# Patient Record
Sex: Female | Born: 1970 | Hispanic: No | Marital: Single
Health system: Southern US, Community
[De-identification: ages and names within clinical notes are randomized; demographics above are authoritative.]

## PROBLEM LIST (undated history)

## (undated) DIAGNOSIS — R51 Headache: Secondary | ICD-10-CM

## (undated) DIAGNOSIS — I10 Essential (primary) hypertension: Secondary | ICD-10-CM

## (undated) DIAGNOSIS — D649 Anemia, unspecified: Secondary | ICD-10-CM

## (undated) DIAGNOSIS — G43909 Migraine, unspecified, not intractable, without status migrainosus: Secondary | ICD-10-CM

## (undated) DIAGNOSIS — F32A Depression, unspecified: Secondary | ICD-10-CM

## (undated) DIAGNOSIS — K219 Gastro-esophageal reflux disease without esophagitis: Secondary | ICD-10-CM

## (undated) DIAGNOSIS — G4730 Sleep apnea, unspecified: Secondary | ICD-10-CM

## (undated) DIAGNOSIS — E119 Type 2 diabetes mellitus without complications: Secondary | ICD-10-CM

## (undated) DIAGNOSIS — J309 Allergic rhinitis, unspecified: Secondary | ICD-10-CM

## (undated) DIAGNOSIS — M1990 Unspecified osteoarthritis, unspecified site: Secondary | ICD-10-CM

## (undated) DIAGNOSIS — I2510 Atherosclerotic heart disease of native coronary artery without angina pectoris: Secondary | ICD-10-CM

## (undated) DIAGNOSIS — E039 Hypothyroidism, unspecified: Secondary | ICD-10-CM

## (undated) DIAGNOSIS — R519 Headache, unspecified: Secondary | ICD-10-CM

## (undated) DIAGNOSIS — E785 Hyperlipidemia, unspecified: Secondary | ICD-10-CM

## (undated) DIAGNOSIS — F419 Anxiety disorder, unspecified: Secondary | ICD-10-CM

## (undated) DIAGNOSIS — F329 Major depressive disorder, single episode, unspecified: Secondary | ICD-10-CM

## (undated) HISTORY — DX: Allergic rhinitis, unspecified: J30.9

## (undated) HISTORY — DX: Hyperlipidemia, unspecified: E78.5

## (undated) HISTORY — DX: Anemia, unspecified: D64.9

## (undated) HISTORY — DX: Atherosclerotic heart disease of native coronary artery without angina pectoris: I25.10

## (undated) HISTORY — PX: INDUCED ABORTION: SHX677

---

## 2000-06-21 ENCOUNTER — Other Ambulatory Visit: Admission: RE | Admit: 2000-06-21 | Discharge: 2000-06-21 | Payer: Self-pay | Admitting: Unknown Physician Specialty

## 2000-06-21 ENCOUNTER — Other Ambulatory Visit: Admission: RE | Admit: 2000-06-21 | Discharge: 2000-06-21 | Payer: Self-pay | Admitting: *Deleted

## 2000-07-20 ENCOUNTER — Inpatient Hospital Stay (HOSPITAL_COMMUNITY): Admission: EM | Admit: 2000-07-20 | Discharge: 2000-07-23 | Payer: Self-pay | Admitting: Emergency Medicine

## 2000-07-20 ENCOUNTER — Encounter: Payer: Self-pay | Admitting: Family Medicine

## 2000-08-06 ENCOUNTER — Encounter: Admission: RE | Admit: 2000-08-06 | Discharge: 2000-11-04 | Payer: Self-pay | Admitting: Family Medicine

## 2009-06-26 HISTORY — PX: TONSILLECTOMY AND ADENOIDECTOMY: SUR1326

## 2014-10-31 ENCOUNTER — Emergency Department (HOSPITAL_COMMUNITY): Payer: 59

## 2014-10-31 ENCOUNTER — Encounter (HOSPITAL_COMMUNITY): Payer: Self-pay | Admitting: Emergency Medicine

## 2014-10-31 ENCOUNTER — Emergency Department (HOSPITAL_COMMUNITY)
Admission: EM | Admit: 2014-10-31 | Discharge: 2014-10-31 | Disposition: A | Discharge status: Home / Self Care | Payer: 59 | Attending: Emergency Medicine | Admitting: Emergency Medicine

## 2014-10-31 DIAGNOSIS — R0602 Shortness of breath: Secondary | ICD-10-CM | POA: Insufficient documentation

## 2014-10-31 DIAGNOSIS — Z3202 Encounter for pregnancy test, result negative: Secondary | ICD-10-CM | POA: Insufficient documentation

## 2014-10-31 DIAGNOSIS — F410 Panic disorder [episodic paroxysmal anxiety] without agoraphobia: Secondary | ICD-10-CM | POA: Diagnosis not present

## 2014-10-31 DIAGNOSIS — R0789 Other chest pain: Secondary | ICD-10-CM | POA: Insufficient documentation

## 2014-10-31 DIAGNOSIS — R51 Headache: Secondary | ICD-10-CM | POA: Insufficient documentation

## 2014-10-31 DIAGNOSIS — R519 Headache, unspecified: Secondary | ICD-10-CM

## 2014-10-31 DIAGNOSIS — E119 Type 2 diabetes mellitus without complications: Secondary | ICD-10-CM | POA: Diagnosis not present

## 2014-10-31 DIAGNOSIS — I10 Essential (primary) hypertension: Secondary | ICD-10-CM | POA: Insufficient documentation

## 2014-10-31 HISTORY — DX: Essential (primary) hypertension: I10

## 2014-10-31 LAB — CBC WITH DIFFERENTIAL/PLATELET
BASOS ABS: 0.1 10*3/uL (ref 0.0–0.1)
BASOS PCT: 1 % (ref 0–1)
Eosinophils Absolute: 0.1 10*3/uL (ref 0.0–0.7)
Eosinophils Relative: 2 % (ref 0–5)
HEMATOCRIT: 40.2 % (ref 36.0–46.0)
Hemoglobin: 13.6 g/dL (ref 12.0–15.0)
Lymphocytes Relative: 33 % (ref 12–46)
Lymphs Abs: 2.7 10*3/uL (ref 0.7–4.0)
MCH: 27.8 pg (ref 26.0–34.0)
MCHC: 33.8 g/dL (ref 30.0–36.0)
MCV: 82.2 fL (ref 78.0–100.0)
Monocytes Absolute: 0.5 10*3/uL (ref 0.1–1.0)
Monocytes Relative: 6 % (ref 3–12)
NEUTROS ABS: 5 10*3/uL (ref 1.7–7.7)
NEUTROS PCT: 58 % (ref 43–77)
Platelets: 436 10*3/uL — ABNORMAL HIGH (ref 150–400)
RBC: 4.89 MIL/uL (ref 3.87–5.11)
RDW: 12.3 % (ref 11.5–15.5)
WBC: 8.4 10*3/uL (ref 4.0–10.5)

## 2014-10-31 LAB — BASIC METABOLIC PANEL
Anion gap: 10 (ref 5–15)
BUN: 12 mg/dL (ref 6–20)
CALCIUM: 9.4 mg/dL (ref 8.9–10.3)
CO2: 26 mmol/L (ref 22–32)
CREATININE: 0.57 mg/dL (ref 0.44–1.00)
Chloride: 102 mmol/L (ref 101–111)
GFR calc Af Amer: >60 mL/min (ref >60)
GFR calc non Af Amer: >60 mL/min (ref >60)
GLUCOSE: 246 mg/dL — AB (ref 70–99)
Potassium: 3.6 mmol/L (ref 3.5–5.1)
Sodium: 138 mmol/L (ref 135–145)

## 2014-10-31 LAB — I-STAT BETA HCG BLOOD, ED (MC, WL, AP ONLY): I-stat hCG, quantitative: <5 m[IU]/mL (ref <5)

## 2014-10-31 LAB — CBG MONITORING, ED: Glucose-Capillary: 234 mg/dL — ABNORMAL HIGH (ref 70–99)

## 2014-10-31 LAB — TROPONIN I: Troponin I: 0.03 ng/mL (ref <0.031)

## 2014-10-31 LAB — D-DIMER, QUANTITATIVE: D-Dimer, Quant: <0.27 ug/mL-FEU (ref 0.00–0.48)

## 2014-10-31 MED ORDER — KETOROLAC TROMETHAMINE 15 MG/ML IJ SOLN
15.0000 mg | Freq: Once | INTRAMUSCULAR | Status: AC
  Administered 2014-10-31: 15 mg via INTRAVENOUS
  Filled 2014-10-31: qty 1

## 2014-10-31 MED ORDER — DIPHENHYDRAMINE HCL 50 MG/ML IJ SOLN
25.0000 mg | Freq: Once | INTRAMUSCULAR | Status: AC
  Administered 2014-10-31: 25 mg via INTRAVENOUS
  Filled 2014-10-31: qty 1

## 2014-10-31 MED ORDER — SODIUM CHLORIDE 0.9 % IV BOLUS (SEPSIS)
500.0000 mL | Freq: Once | INTRAVENOUS | Status: AC
  Administered 2014-10-31: 500 mL via INTRAVENOUS

## 2014-10-31 MED ORDER — METOCLOPRAMIDE HCL 5 MG/ML IJ SOLN
10.0000 mg | Freq: Once | INTRAMUSCULAR | Status: AC
  Administered 2014-10-31: 10 mg via INTRAVENOUS
  Filled 2014-10-31: qty 2

## 2014-10-31 NOTE — ED Notes (Signed)
Bed: [id number] Expected date:  Expected time:  Means of arrival:  Comments: EMS chest pain

## 2014-10-31 NOTE — Discharge Instructions (Signed)
Your blood pressure was elevated today.  Please check your blood pressure one to two times daily and write down the number for your family doctor.  Take your medications as prescribed.     Chest Wall Pain Chest wall pain is pain in or around the bones and muscles of your chest. It may take up to 6 weeks to get better. It may take longer if you must stay physically active in your work and activities.  CAUSES  Chest wall pain may happen on its own. However, it may be caused by:  A viral illness like the flu.  Injury.  Coughing.  Exercise.  Arthritis.  Fibromyalgia.  Shingles. HOME CARE INSTRUCTIONS   Avoid overtiring physical activity. Try not to strain or perform activities that cause pain. This includes any activities using your chest or your abdominal and side muscles, especially if heavy weights are used.  Put ice on the sore area.  Put ice in a plastic bag.  Place a towel between your skin and the bag.  Leave the ice on for 15-20 minutes per hour while awake for the first 2 days.  Only take over-the-counter or prescription medicines for pain, discomfort, or fever as directed by your caregiver. SEEK IMMEDIATE MEDICAL CARE IF:   Your pain increases, or you are very uncomfortable.  You have a fever.  Your chest pain becomes worse.  You have new, unexplained symptoms.  You have nausea or vomiting.  You feel sweaty or lightheaded.  You have a cough with phlegm (sputum), or you cough up blood. MAKE SURE YOU:   Understand these instructions.  Will watch your condition.  Will get help right away if you are not doing well or get worse. Document Released: 06/12/2005 Document Revised: 09/04/2011 Document Reviewed: 02/06/2011 [facility] Patient Information 2015 [place], [place]. This information is not intended to replace advice given to you by your health care provider. Make sure you discuss any questions you have with your health care provider.  General Headache  Without Cause A headache is pain or discomfort felt around the head or neck area. The specific cause of a headache may not be found. There are many causes and types of headaches. A few common ones are:  Tension headaches.  Migraine headaches.  Cluster headaches.  Chronic daily headaches. HOME CARE INSTRUCTIONS   Keep all follow-up appointments with your caregiver or any specialist referral.  Only take over-the-counter or prescription medicines for pain or discomfort as directed by your caregiver.  Lie down in a dark, quiet room when you have a headache.  Keep a headache journal to find out what may trigger your migraine headaches. For example, write down:  What you eat and drink.  How much sleep you get.  Any change to your diet or medicines.  Try massage or other relaxation techniques.  Put ice packs or heat on the head and neck. Use these 3 to 4 times per day for 15 to 20 minutes each time, or as needed.  Limit stress.  Sit up straight, and do not tense your muscles.  Quit smoking if you smoke.  Limit alcohol use.  Decrease the amount of caffeine you drink, or stop drinking caffeine.  Eat and sleep on a regular schedule.  Get 7 to 9 hours of sleep, or as recommended by your caregiver.  Keep lights dim if bright lights bother you and make your headaches worse. SEEK MEDICAL CARE IF:   You have problems with the medicines you were prescribed.  Your medicines are not working.  You have a change from the usual headache.  You have nausea or vomiting. SEEK IMMEDIATE MEDICAL CARE IF:   Your headache becomes severe.  You have a fever.  You have a stiff neck.  You have loss of vision.  You have muscular weakness or loss of muscle control.  You start losing your balance or have trouble walking.  You feel faint or pass out.  You have severe symptoms that are different from your first symptoms. MAKE SURE YOU:   Understand these instructions.  Will watch  your condition.  Will get help right away if you are not doing well or get worse. Document Released: 06/12/2005 Document Revised: 09/04/2011 Document Reviewed: 06/28/2011 [facility] Patient Information 2015 [place], [place]. This information is not intended to replace advice given to you by your health care provider. Make sure you discuss any questions you have with your health care provider.

## 2014-10-31 NOTE — ED Notes (Signed)
Patient in with complaints of SOB related to anxiety. Hypertension, hx of same. 233/144. Discomfort in chest. CBG 288.

## 2014-10-31 NOTE — ED Provider Notes (Signed)
CSN: [id number]     Arrival date & time 10/31/14  1801 History   First MD Initiated Contact with Patient 10/31/14 1807     Chief Complaint  Patient presents with  . Panic Attack  . Headache  . Hypertension     Patient is a 44 y.o. female presenting with headaches and hypertension. The history is provided by the patient. No language interpreter was used.  Headache Hypertension Associated symptoms include headaches.   Ms. Yetta BarreJones presents for evaluation of headache, chest pain, shortness of breath. Symptoms started just prior to ED arrival. 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 She reports symptoms were sudden in onset and she felt that she had a panic attack in association with this. She has some tingling in her fingers bilaterally. She continues to have headache, chest discomfort, shortness of breath. [place] home about a week and a half ago. She had considerable lower extremity edema, this is improved with diuretics at home. She is concerned for potential blood clot in her leg but she does not have a history of blood clots and she does not take any oral estrogens. She has a history of hypertension and diabetes. No history of CHF. Symptoms are moderate, constant, improving.  Past Medical History  Diagnosis Date  . Diabetes mellitus without complication   . Hypertension    No past surgical history on file. No family history on file. History  Substance Use Topics  . Smoking status: Not on file  . Smokeless tobacco: Not on file  . Alcohol Use: Not on file   OB History    No data available     Review of Systems  Neurological: Positive for headaches.  All other systems reviewed and are negative.     Allergies  Review of patient's allergies indicates not on file.  Home Medications   Prior to Admission medications   Not on File   BP 188/105 mmHg  Pulse 92   Temp(Src) 98.8 F (37.1 C) (Oral)  Resp 18  SpO2 99% Physical Exam  Constitutional: She is oriented to person, place, and time. She appears well-developed and well-nourished.  HENT:  Head: Normocephalic and atraumatic.  Eyes: Pupils are equal, round, and reactive to light.  Cardiovascular: Normal rate and regular rhythm.   No murmur heard. Pulmonary/Chest: Effort normal and breath sounds normal. No respiratory distress.  Abdominal: Soft. There is no tenderness. There is no rebound and no guarding.  Musculoskeletal: She exhibits no edema or tenderness.  Neurological: She is alert and oriented to person, place, and time. No cranial nerve deficit.  Skin: Skin is warm and dry.  Psychiatric: She has a normal mood and affect. Her behavior is normal.  Nursing note and vitals reviewed.   ED Course  Procedures (including critical care time) Labs Review Labs Reviewed  BASIC METABOLIC PANEL - Abnormal; Notable for the following:    Glucose, Bld 246 (*)    All other components within normal limits  CBC WITH DIFFERENTIAL/PLATELET - Abnormal; Notable for the following:    Platelets 436 (*)    All other components within normal limits  CBG MONITORING, ED - Abnormal; Notable for the following:    Glucose-Capillary 234 (*)    All other components within normal limits  D-DIMER, QUANTITATIVE  TROPONIN I  I-STAT BETA HCG BLOOD, ED ([facility], [facility], [facility] ONLY)    Imaging Review Dg  Chest 2 View  10/31/2014   CLINICAL DATA:  Shortness of breath related to anxiety today, headache, chest discomfort, history of similar symptoms, history hypertension  EXAM: CHEST  2 VIEW  COMPARISON:  None  FINDINGS: Normal heart size, mediastinal contours and pulmonary vascularity.  Mild peribronchial thickening.  Lungs clear.  No infiltrate, pleural effusion or pneumothorax.  Biconvex thoracolumbar scoliosis noted.  IMPRESSION: Minimal bronchitic changes without infiltrate.   Electronically Signed   By: Ulyses SouthwardMark  Boles M.D.   On:  10/31/2014 19:10   Ct Head Wo Contrast  10/31/2014   CLINICAL DATA:  Shortness of breath related to anxiety, headache all over, hypertension, diabetes  EXAM: CT HEAD WITHOUT CONTRAST  TECHNIQUE: Contiguous axial images were obtained from the base of the skull through the vertex without intravenous contrast.  COMPARISON:  None  FINDINGS: Normal ventricular morphology.  No midline shift or mass effect.  Normal appearance of brain parenchyma.  No intracranial hemorrhage, mass lesion, or acute infarction.  Visualized paranasal sinuses and mastoid air cells clear.  Bones unremarkable.  IMPRESSION: Normal exam.   Electronically Signed   By: Ulyses SouthwardMark  Boles M.D.   On: 10/31/2014 19:15     EKG Interpretation None      MDM   Final diagnoses:  Bad headache  Chest wall pain    Patient here for evaluation of sudden onset headache and shortness of breath. History and presentation is not consistent with subarachnoid hemorrhage, meningitis, ACS, CHF or PE. Patient feels improved upon repeat evaluation in the emergency department following headache cocktail of Toradol. Chest discomfort is pleuritic in nature, suspect muscular skeletal chest pain as well as element of anxiety. Observation assurance. Discussed PCP follow-up as well as home care and return precautions. Presentation is not consistent with hypertensive urgency.    Tilden FossaElizabeth Anari Evitt, MD 10/31/14 2255

## 2015-03-27 ENCOUNTER — Emergency Department (HOSPITAL_BASED_OUTPATIENT_CLINIC_OR_DEPARTMENT_OTHER)
Admission: EM | Admit: 2015-03-27 | Discharge: 2015-03-27 | Disposition: A | Discharge status: Home / Self Care | Payer: 59 | Attending: Emergency Medicine | Admitting: Emergency Medicine

## 2015-03-27 ENCOUNTER — Emergency Department (HOSPITAL_BASED_OUTPATIENT_CLINIC_OR_DEPARTMENT_OTHER): Payer: 59

## 2015-03-27 ENCOUNTER — Encounter (HOSPITAL_BASED_OUTPATIENT_CLINIC_OR_DEPARTMENT_OTHER): Payer: Self-pay | Admitting: Emergency Medicine

## 2015-03-27 DIAGNOSIS — R42 Dizziness and giddiness: Secondary | ICD-10-CM | POA: Insufficient documentation

## 2015-03-27 DIAGNOSIS — R1110 Vomiting, unspecified: Secondary | ICD-10-CM | POA: Diagnosis not present

## 2015-03-27 DIAGNOSIS — R0602 Shortness of breath: Secondary | ICD-10-CM | POA: Insufficient documentation

## 2015-03-27 DIAGNOSIS — R51 Headache: Secondary | ICD-10-CM | POA: Insufficient documentation

## 2015-03-27 DIAGNOSIS — Z79899 Other long term (current) drug therapy: Secondary | ICD-10-CM | POA: Diagnosis not present

## 2015-03-27 DIAGNOSIS — H9313 Tinnitus, bilateral: Secondary | ICD-10-CM | POA: Diagnosis not present

## 2015-03-27 DIAGNOSIS — Z3202 Encounter for pregnancy test, result negative: Secondary | ICD-10-CM | POA: Diagnosis not present

## 2015-03-27 DIAGNOSIS — J3489 Other specified disorders of nose and nasal sinuses: Secondary | ICD-10-CM | POA: Insufficient documentation

## 2015-03-27 DIAGNOSIS — Z7951 Long term (current) use of inhaled steroids: Secondary | ICD-10-CM | POA: Diagnosis not present

## 2015-03-27 DIAGNOSIS — R05 Cough: Secondary | ICD-10-CM | POA: Insufficient documentation

## 2015-03-27 DIAGNOSIS — R0981 Nasal congestion: Secondary | ICD-10-CM | POA: Diagnosis present

## 2015-03-27 LAB — CBC
HEMATOCRIT: 38.9 % (ref 36.0–46.0)
HEMOGLOBIN: 13.5 g/dL (ref 12.0–15.0)
MCH: 28 pg (ref 26.0–34.0)
MCHC: 34.7 g/dL (ref 30.0–36.0)
MCV: 80.5 fL (ref 78.0–100.0)
Platelets: 462 10*3/uL — ABNORMAL HIGH (ref 150–400)
RBC: 4.83 MIL/uL (ref 3.87–5.11)
RDW: 12.5 % (ref 11.5–15.5)
WBC: 10.4 10*3/uL (ref 4.0–10.5)

## 2015-03-27 LAB — PREGNANCY, URINE: Preg Test, Ur: NEGATIVE

## 2015-03-27 LAB — BASIC METABOLIC PANEL
ANION GAP: 8 (ref 5–15)
BUN: 12 mg/dL (ref 6–20)
CHLORIDE: 99 mmol/L — AB (ref 101–111)
CO2: 30 mmol/L (ref 22–32)
Calcium: 9.1 mg/dL (ref 8.9–10.3)
Creatinine, Ser: 0.78 mg/dL (ref 0.44–1.00)
GFR calc Af Amer: >60 mL/min (ref >60)
GFR calc non Af Amer: >60 mL/min (ref >60)
GLUCOSE: 189 mg/dL — AB (ref 65–99)
POTASSIUM: 3.1 mmol/L — AB (ref 3.5–5.1)
Sodium: 137 mmol/L (ref 135–145)

## 2015-03-27 MED ORDER — IOHEXOL 300 MG/ML  SOLN
80.0000 mL | Freq: Once | INTRAMUSCULAR | Status: AC | PRN
  Administered 2015-03-27: 75 mL via INTRAVENOUS

## 2015-03-27 MED ORDER — TETRACAINE HCL 0.5 % OP SOLN
2.0000 [drp] | Freq: Once | OPHTHALMIC | Status: AC
  Administered 2015-03-27: 2 [drp] via OPHTHALMIC
  Filled 2015-03-27: qty 2

## 2015-03-27 NOTE — Discharge Instructions (Signed)
Upper Respiratory Infection, Adult An upper respiratory infection (URI) is also sometimes known as the common cold. The upper respiratory tract includes the nose, sinuses, throat, trachea, and bronchi. Bronchi are the airways leading to the lungs. Most people improve within 1 week, but symptoms can last up to 2 weeks. A residual cough may last even longer.  CAUSES Many different viruses can infect the tissues lining the upper respiratory tract. The tissues become irritated and inflamed and often become very moist. Mucus production is also common. A cold is contagious. You can easily spread the virus to others by oral contact. This includes kissing, sharing a glass, coughing, or sneezing. Touching your mouth or nose and then touching a surface, which is then touched by another person, can also spread the virus. SYMPTOMS  Symptoms typically develop 1 to 3 days after you come in contact with a cold virus. Symptoms vary from person to person. They may include:  Runny nose.  Sneezing.  Nasal congestion.  Sinus irritation.  Sore throat.  Loss of voice (laryngitis).  Cough.  Fatigue.  Muscle aches.  Loss of appetite.  Headache.  Low-grade fever. DIAGNOSIS  You might diagnose your own cold based on familiar symptoms, since most people get a cold 2 to 3 times a year. Your caregiver can confirm this based on your exam. Most importantly, your caregiver can check that your symptoms are not due to another disease such as strep throat, sinusitis, pneumonia, asthma, or epiglottitis. Blood tests, throat tests, and X-rays are not necessary to diagnose a common cold, but they may sometimes be helpful in excluding other more serious diseases. Your caregiver will decide if any further tests are required. RISKS AND COMPLICATIONS  You may be at risk for a more severe case of the common cold if you smoke cigarettes, have chronic heart disease (such as heart failure) or lung disease (such as asthma), or if  you have a weakened immune system. The very young and very old are also at risk for more serious infections. Bacterial sinusitis, middle ear infections, and bacterial pneumonia can complicate the common cold. The common cold can worsen asthma and chronic obstructive pulmonary disease (COPD). Sometimes, these complications can require emergency medical care and may be life-threatening. PREVENTION  The best way to protect against getting a cold is to practice good hygiene. Avoid oral or hand contact with people with cold symptoms. Wash your hands often if contact occurs. There is no clear evidence that vitamin C, vitamin E, echinacea, or exercise reduces the chance of developing a cold. However, it is always recommended to get plenty of rest and practice good nutrition. TREATMENT  Treatment is directed at relieving symptoms. There is no cure. Antibiotics are not effective, because the infection is caused by a virus, not by bacteria. Treatment may include:  Increased fluid intake. Sports drinks offer valuable electrolytes, sugars, and fluids.  Breathing heated mist or steam (vaporizer or shower).  Eating chicken soup or other clear broths, and maintaining good nutrition.  Getting plenty of rest.  Using gargles or lozenges for comfort.  Controlling fevers with ibuprofen or acetaminophen as directed by your caregiver.  Increasing usage of your inhaler if you have asthma. Zinc gel and zinc lozenges, taken in the first 24 hours of the common cold, can shorten the duration and lessen the severity of symptoms. Pain medicines may help with fever, muscle aches, and throat pain. A variety of non-prescription medicines are available to treat congestion and runny nose. Your caregiver   can make recommendations and may suggest nasal or lung inhalers for other symptoms.  HOME CARE INSTRUCTIONS   Only take over-the-counter or prescription medicines for pain, discomfort, or fever as directed by your  caregiver.  Use a warm mist humidifier or inhale steam from a shower to increase air moisture. This may keep secretions moist and make it easier to breathe.  Drink enough water and fluids to keep your urine clear or pale yellow.  Rest as needed.  Return to work when your temperature has returned to normal or as your caregiver advises. You may need to stay home longer to avoid infecting others. You can also use a face mask and careful hand washing to prevent spread of the virus. SEEK MEDICAL CARE IF:   After the first few days, you feel you are getting worse rather than better.  You need your caregiver's advice about medicines to control symptoms.  You develop chills, worsening shortness of breath, or brown or red sputum. These may be signs of pneumonia.  You develop yellow or brown nasal discharge or pain in the face, especially when you bend forward. These may be signs of sinusitis.  You develop a fever, swollen neck glands, pain with swallowing, or white areas in the back of your throat. These may be signs of strep throat. SEEK IMMEDIATE MEDICAL CARE IF:   You have a fever.  You develop severe or persistent headache, ear pain, sinus pain, or chest pain.  You develop wheezing, a prolonged cough, cough up blood, or have a change in your usual mucus (if you have chronic lung disease).  You develop sore muscles or a stiff neck. Document Released: 12/06/2000 Document Revised: 09/04/2011 Document Reviewed: 09/17/2013 Exit[employer] Patient Information 2015 [employer]. This information is not intended to replace advice given to you by your health care provider. Make sure you discuss any questions you have with your health care provider.  

## 2015-03-27 NOTE — ED Provider Notes (Signed)
CSN: [id number]     Arrival date & time 03/27/15  1851 History  By signing my name below, I, Phillis Haggis, attest that this documentation has been prepared under the direction and in the presence of Elwin Mocha, MD. Electronically Signed: Phillis Haggis, ED Scribe. 03/27/2015. 7:36 PM.    Chief Complaint  Patient presents with  . Recurrent Sinusitis   Patient is a 44 y.o. female presenting with ear pain. The history is provided by the patient. No language interpreter was used.  Otalgia Location:  Bilateral Onset quality:  Gradual Duration:  3 months Timing:  Constant Progression:  Worsening Chronicity:  New Ineffective treatments:  OTC medications (anti-biotics) Associated symptoms: congestion, cough, headaches, tinnitus and vomiting   Associated symptoms: no fever   Risk factors: chronic ear infection   HPI Comments: Cynthia Kirby is a 44 y.o. Female with hx of DM and HTN who presents to the Emergency Department complaining of bilateral otalgia, vertigo and SOB onset 3 months ago. Pt states that she has been having recurrent sinus infections, headaches, facial pain, chills, nausea, vomiting, middle ear infection, and tinnitus that worsens her vertigo. Reports intermittent productive cough. Reports that she works at an old building that may have dust and mold. Pt states that she has been seen by an ENT who said that she did not have an ear infection. She states that she has been given generic Flonase and anti-biotics to no relief; states that she has adverse effects to the anti-biotics. Denies fever and chills. Pt takes Metformin and Losartan daily and has stopped taking ibuprofen. 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   Past Medical History  Diagnosis Date  . Diabetes mellitus without complication (HCC)   . Hypertension    History reviewed. No pertinent past surgical history. History reviewed. No pertinent family  history. Social History  Substance Use Topics  . Smoking status: Never Smoker   . Smokeless tobacco: None  . Alcohol Use: No   OB History    No data available     Review of Systems  Constitutional: Positive for chills. Negative for fever.  HENT: Positive for congestion, ear pain and tinnitus.   Respiratory: Positive for cough and shortness of breath.   Cardiovascular: Negative for chest pain.  Gastrointestinal: Positive for vomiting.  Neurological: Positive for dizziness and headaches.  All other systems reviewed and are negative.  Allergies  Ciprofloxacin and Zithromax  Home Medications   Prior to Admission medications   Medication Sig Start Date End Date Taking? Authorizing Provider  amLODipine (NORVASC) 10 MG tablet Take 10 mg by mouth daily.   Yes Historical Provider, MD  fluticasone (FLONASE) 50 MCG/ACT nasal spray Place into both nostrils daily.   Yes Historical Provider, MD  losartan (COZAAR) 100 MG tablet Take 100 mg by mouth daily.   Yes Historical Provider, MD  metFORMIN (GLUCOPHAGE) 1000 MG tablet Take 1,000 mg by mouth 2 (two) times daily with a meal.   Yes Historical Provider, MD   BP 154/98 mmHg  Pulse 92  Temp(Src) 98.6 F (37 C) (Oral)  Resp 18  Ht  (1.651 m)  Wt 238 lb (107.956 kg)  BMI 39.61 kg/m2  SpO2 98%  LMP 03/15/2015 Physical Exam  Constitutional: She is oriented to person, place, and time. She appears well-developed and well-nourished. No distress.  HENT:  Head: Normocephalic and atraumatic.  Mouth/Throat: Oropharynx is clear and moist.  TMs clear bilaterally  Eyes: EOM are normal. Pupils are equal, round, and reactive to light.  R IOP 21, L IOP 19  Neck: Normal range of motion. Neck supple.  Cardiovascular: Normal rate and regular rhythm.  Exam reveals no friction rub.   No murmur heard. Pulmonary/Chest: Effort normal and breath sounds normal. No respiratory distress. She has no wheezes. She has no rales.  Abdominal: Soft. She  exhibits no distension. There is no tenderness. There is no rebound.  Musculoskeletal: Normal range of motion. She exhibits no edema.  Neurological: She is alert and oriented to person, place, and time.  Skin: Skin is warm. No rash noted. She is not diaphoretic.  Nursing note and vitals reviewed.   ED Course  Procedures (including critical care time) DIAGNOSTIC STUDIES: Oxygen Saturation is 98% on RA, normal by my interpretation.    COORDINATION OF CARE: 7:41 PM-Discussed treatment plan which includes eye exam and ENT referral with pt at bedside and pt agreed to plan.   Labs Review Labs Reviewed  BASIC METABOLIC PANEL - Abnormal; Notable for the following:    Potassium 3.1 (*)    Chloride 99 (*)    Glucose, Bld 189 (*)    All other components within normal limits  CBC - Abnormal; Notable for the following:    Platelets 462 (*)    All other components within normal limits  PREGNANCY, URINE    Imaging Review Dg Chest 2 View  03/27/2015   CLINICAL DATA:  Shortness of breath  EXAM: CHEST  2 VIEW  COMPARISON:  10/31/2014 chest radiograph  FINDINGS: Stable cardiomediastinal silhouette with normal heart size. There is new fullness of the hilar soft tissues on the lateral view. No pneumothorax. No pleural effusion. Clear lungs, with no focal lung consolidation and no pulmonary edema. Visualized osseous structures appear intact.  IMPRESSION: New fullness of the hilar soft tissues on the lateral view, which could represent hilar adenopathy. Recommend further evaluation with chest CT with IV contrast.   Electronically Signed   By: Delbert Phenix M.D.   On: 03/27/2015 20:14   Ct Chest W Contrast  03/27/2015   CLINICAL DATA:  Hilar adenopathy questioned on previous chest x-ray  EXAM: CT CHEST WITH CONTRAST  TECHNIQUE: Multidetector CT imaging of the chest was performed during intravenous contrast administration.  CONTRAST:  Dose currently unavailable, reference EMR charting.  COMPARISON:  None.   FINDINGS: THORACIC INLET/BODY WALL:  No acute abnormality.  MEDIASTINUM:  Normal heart size. No pericardial effusion. No acute vascular abnormality. No adenopathy. Small sliding hiatal hernia.  LUNG WINDOWS:  No consolidation.  No effusion.  No suspicious pulmonary nodule.  UPPER ABDOMEN:  No acute findings.  Hepatic steatosis.  OSSEOUS:  Shaped scoliosis with intermittent spondylotic change.  IMPRESSION: 1. No explanation for shortness of breath. No adenopathy as questioned on previous radiograph. 2. Hepatic steatosis. 3. Small hiatal hernia. 4. Scoliosis.   Electronically Signed   By: Marnee Spring M.D.   On: 03/27/2015 22:17     EKG Interpretation None      MDM   Final diagnoses:  Sinus congestion    5F here with multiple complaints for the past few months. Mainly is dealing with frontal headaches which she thinks is sinusitis. She's been on antibiotics without relief. She is also having tinnitus and some occasional vertigo. She's seen ENT, who informed her everything was normal. She also reports some coughing and occasional chest pain. Here exam benign. IOPs checked in case her headaches  were from glaucoma, but both IOPs are normal. Patient has multiple chronic complaints and she's seen a specialist for these complaints. Exam here normal. Instructed to f/u with PCP, given another ENT to see as she seems dissatisfied with her current ENT.   I personally performed the services described in this documentation, which was scribed in my presence. The recorded information has been reviewed and is accurate.     Elwin Mocha, MD 03/28/15 0040

## 2015-03-27 NOTE — ED Notes (Signed)
Pt reports sinus infection x 3 months, has been followed by pcp with no improvement, c/o bilateral ear pain, vertigo and sob, denies chest pain

## 2015-03-27 NOTE — ED Notes (Signed)
Pt states she has been on every antibiotic possible and has had no improvement in sinus infection, presents today with increased complaints of sinus infection and increased sob. Pt denies chest pain or pressure

## 2016-07-18 ENCOUNTER — Encounter (HOSPITAL_BASED_OUTPATIENT_CLINIC_OR_DEPARTMENT_OTHER): Payer: Self-pay | Admitting: *Deleted

## 2016-07-18 ENCOUNTER — Emergency Department (HOSPITAL_BASED_OUTPATIENT_CLINIC_OR_DEPARTMENT_OTHER): Payer: BLUE CROSS/BLUE SHIELD

## 2016-07-18 ENCOUNTER — Emergency Department (HOSPITAL_BASED_OUTPATIENT_CLINIC_OR_DEPARTMENT_OTHER)
Admission: EM | Admit: 2016-07-18 | Discharge: 2016-07-18 | Disposition: A | Discharge status: Home / Self Care | Payer: BLUE CROSS/BLUE SHIELD | Attending: Emergency Medicine | Admitting: Emergency Medicine

## 2016-07-18 DIAGNOSIS — I10 Essential (primary) hypertension: Secondary | ICD-10-CM | POA: Insufficient documentation

## 2016-07-18 DIAGNOSIS — R51 Headache: Secondary | ICD-10-CM

## 2016-07-18 DIAGNOSIS — R519 Headache, unspecified: Secondary | ICD-10-CM

## 2016-07-18 DIAGNOSIS — Z79899 Other long term (current) drug therapy: Secondary | ICD-10-CM | POA: Insufficient documentation

## 2016-07-18 DIAGNOSIS — Z7984 Long term (current) use of oral hypoglycemic drugs: Secondary | ICD-10-CM | POA: Diagnosis not present

## 2016-07-18 DIAGNOSIS — E119 Type 2 diabetes mellitus without complications: Secondary | ICD-10-CM | POA: Diagnosis not present

## 2016-07-18 MED ORDER — HYDRALAZINE HCL 20 MG/ML IJ SOLN
5.0000 mg | Freq: Once | INTRAMUSCULAR | Status: AC
  Administered 2016-07-18: 5 mg via INTRAVENOUS
  Filled 2016-07-18: qty 1

## 2016-07-18 MED ORDER — KETOROLAC TROMETHAMINE 30 MG/ML IJ SOLN
30.0000 mg | Freq: Once | INTRAMUSCULAR | Status: AC
  Administered 2016-07-18: 30 mg via INTRAVENOUS
  Filled 2016-07-18: qty 1

## 2016-07-18 MED ORDER — LOSARTAN POTASSIUM 50 MG PO TABS
50.0000 mg | ORAL_TABLET | Freq: Once | ORAL | Status: DC
  Filled 2016-07-18: qty 1

## 2016-07-18 MED ORDER — ACETAMINOPHEN 500 MG PO TABS
1000.0000 mg | ORAL_TABLET | Freq: Once | ORAL | Status: AC
  Administered 2016-07-18: 1000 mg via ORAL
  Filled 2016-07-18: qty 2

## 2016-07-18 NOTE — ED Triage Notes (Signed)
Pt c/o h/a behind right eye x 11 hrs also c/o dizziness

## 2016-07-18 NOTE — ED Provider Notes (Signed)
By signing my name below, I, Vista Minkobert Ross, attest that this documentation has been prepared under the direction and in the presence of Travis Purk N Dempsey Ahonen, DO. Electronically signed, Vista Minkobert Ross, ED Scribe. 07/18/16. 1:42 AM.  TIME SEEN: 1:35 AM  CHIEF COMPLAINT: Hypertension, Headache  HPI:  HPI Comments: Cynthia Kirby is a 46 y.o. female, with Hx of DM, HTN, HLD, who presents to the Emergency Department complaining of persistent, gradual onset headache that radiates behind right her eye and shoots through the right side of her head, onset approximately 13 hours ago. Pt has Hx migraines but states that this feels much more severe than previous. Pt states that she was lightheaded at work today. She also reports that her "sight was going in and out". Denies vision loss but states it seemed like there were "stars in my vision", flashers/floaters. No tingling or numbness to extremities or face. No focal weakness. She takes 10mg  Amlodipine and 50mg  Losartan for her HTN. She normally takes Losartan at night and Amlodipine during the day, states that she is mostly compliant but does forget on occasion. She took her BP meds today and checked her BP earlier when she started to have the headache. She does not take her BP every day. Her BP was approximately 140/70 when she took it at home and is 181/98 currently. She took 3 ibuprofen 200mg 's earlier today after the onset of her headache but reports no significant relief. No nausea or vomiting. No chest pain or shortness of breath. Denies at this is a sudden onset, thunderclap headache. Does not describe it as the worst headache of her life. Has had similar headaches but does state this was more severe. States normally ibuprofen helps with her headaches. States she cannot take a migraine cocktail because it makes her feel "loopy".  ROS: See HPI Constitutional: no fever  Eyes: no drainage  ENT: no runny nose   Cardiovascular:  no chest pain  Resp: no SOB  GI: no  vomiting GU: no dysuria Integumentary: no rash  Allergy: no hives  Musculoskeletal: no leg swelling  Neurological: no slurred speech ROS otherwise negative  PAST MEDICAL HISTORY/PAST SURGICAL HISTORY:  Past Medical History:  Diagnosis Date  . Diabetes mellitus without complication (HCC)   . Hypertension     MEDICATIONS:  Prior to Admission medications   Medication Sig Start Date End Date Taking? Authorizing Provider  amLODipine (NORVASC) 10 MG tablet Take 10 mg by mouth daily.    Historical Provider, MD  fluticasone (FLONASE) 50 MCG/ACT nasal spray Place into both nostrils daily.    Historical Provider, MD  losartan (COZAAR) 100 MG tablet Take 100 mg by mouth daily.    Historical Provider, MD  metFORMIN (GLUCOPHAGE) 1000 MG tablet Take 1,000 mg by mouth 2 (two) times daily with a meal.    Historical Provider, MD   ALLERGIES:  Allergies  Allergen Reactions  . Ciprofloxacin Nausea And Vomiting  . Zithromax [Azithromycin]     SOCIAL HISTORY:  Social History  Substance Use Topics  . Smoking status: Never Smoker  . Smokeless tobacco: Not on file  . Alcohol use No    FAMILY HISTORY: No family history on file.  EXAM: BP (!) 204/116   Pulse 93   Temp 98.3 F (36.8 C)   Resp 18   Ht 5' 4.5" (1.638 m)   Wt 220 lb (99.8 kg)   LMP 07/10/2016   SpO2 99%   BMI 37.18 kg/m  CONSTITUTIONAL: Afebrile, alert and oriented and  responds appropriately to questions. Well-appearing; well-nourished HEAD: Normocephalic EYES: Conjunctivae clear, PERRL, EOMI ENT: normal nose; no rhinorrhea; moist mucous membranes NECK: Supple, no meningismus, no nuchal rigidity, no LAD  CARD: RRR; S1 and S2 appreciated; no murmurs, no clicks, no rubs, no gallops RESP: Normal chest excursion without splinting or tachypnea; breath sounds clear and equal bilaterally; no wheezes, no rhonchi, no rales, no hypoxia or respiratory distress, speaking full sentences ABD/GI: Normal bowel sounds; non-distended;  soft, non-tender, no rebound, no guarding, no peritoneal signs, no hepatosplenomegaly BACK:  The back appears normal and is non-tender to palpation, there is no CVA tenderness EXT: Normal ROM in all joints; non-tender to palpation; no edema; normal capillary refill; no cyanosis, no calf tenderness or swelling    SKIN: Normal color for age and race; warm; no rash NEURO: Moves all extremities equally, sensation to light touch intact diffusely, cranial nerves II through XII intact, normal speech. Strength 5/5 in all 4 extremities bilaterally. No dysmetria with finger to nose bilaterally. Normal gait. PSYCH: The patient's mood and manner are appropriate. Grooming and personal hygiene are appropriate.  MEDICAL DECISION MAKING: Patient here with gradual onset right-sided headache. Has had history of similar headaches but states this is more severe. Denies thunderclap, sudden onset, worst headache of her life. No focal neurologic deficits. No fever, meningismus. No head injury. Not on antiplatelets or anticoagulants. Will obtain head CT given she is hypertensive but very low suspicion for subarachnoid bleed or other intracranial hemorrhage. Nothing on exam to suggest stroke or meningitis. We'll give Tylenol initially for pain and if her CT is negative we will give Toradol. States she cannot take migraine cocktail because it makes her "loopy". I do not feel narcotics are indicated. Denies chest pain or shortness of breath. Reports normal vision currently. No other focal neurologic deficits.  ED PROGRESS: Head CT shows no acute abnormality. Blood pressure has improved to the 150s/80s after IV hydralazine. Headache is almost completely resolved after Tylenol and Toradol. Discussed with patient that her blood pressure may be the cause of her headache and have recommended she start taking her amlodipine and losartan every day. Have recommended she go home and take her dose of losartan. I recommended she start checking  her blood pressure regularly approximately 2 hours after taking her blood pressure medications in the AM and keep a long of this to follow-up with her PCP closely. Discussed at length return precautions with patient and her mother. We'll provide work note. She verbalized understanding and is comfortable with this plan.  At this time, I do not feel there is any life-threatening condition present. I have reviewed and discussed all results (EKG, imaging, lab, urine as appropriate) and exam findings with patient/family. I have reviewed nursing notes and appropriate previous records.  I feel the patient is safe to be discharged home without further emergent workup and can continue workup as an outpatient as needed. Discussed usual and customary return precautions. Patient/family verbalize understanding and are comfortable with this plan.  Outpatient follow-up has been provided. All questions have been answered.   I personally performed the services described in this documentation, which was scribed in my presence. The recorded information has been reviewed and is accurate.    Layla Maw Chameka Mcmullen, DO 07/18/16 [phone]

## 2016-07-18 NOTE — ED Notes (Signed)
[facility] c/o [facility], pinpoints to R eye and over the top to the back of head, mentions sinus issues, and h/o HAs, gradual onset noon, reports dizziness and intermitant visual changes (seeing stars), (denies: numbness/tingling, weakness, CP, sob or nv), has had amlodipine today, did not have her losartan today, admits to h/o DM, htn, high cholesterol and anxiety. Alert, NAD, calm, interactive, resps e/u, speaking in clear complete sentences, no dyspnea noted.

## 2016-07-18 NOTE — Discharge Instructions (Signed)
You may alternate between Tylenol 1000 mg every 6 hours as needed for pain and ibuprofen 800 mg every 8 hours as needed for pain. Please take your losartan when you get home. Please make sure you're taking your amlodipine and losartan every day in the morning. I recommend that 2 hours after you take her blood pressure medications you check your blood pressure and keep a log of this so you may presented to your primary care physician to determine if you need to have any of your medications increased, changed or a new medication added to your regimen. Your head CT today was normal and there is no sign of stroke on your exam.

## 2016-07-18 NOTE — ED Notes (Signed)
Pt to CT

## 2016-07-18 NOTE — ED Notes (Signed)
MD at [facility]

## 2016-11-12 ENCOUNTER — Emergency Department (HOSPITAL_BASED_OUTPATIENT_CLINIC_OR_DEPARTMENT_OTHER): Payer: BLUE CROSS/BLUE SHIELD

## 2016-11-12 ENCOUNTER — Encounter (HOSPITAL_BASED_OUTPATIENT_CLINIC_OR_DEPARTMENT_OTHER): Payer: Self-pay | Admitting: Emergency Medicine

## 2016-11-12 ENCOUNTER — Inpatient Hospital Stay (HOSPITAL_BASED_OUTPATIENT_CLINIC_OR_DEPARTMENT_OTHER)
Admission: EM | Admit: 2016-11-12 | Discharge: 2016-11-14 | DRG: 247 | Disposition: A | Discharge status: Home / Self Care | Payer: BLUE CROSS/BLUE SHIELD | Attending: Cardiology | Admitting: Cardiology

## 2016-11-12 DIAGNOSIS — I10 Essential (primary) hypertension: Secondary | ICD-10-CM | POA: Diagnosis present

## 2016-11-12 DIAGNOSIS — Z87891 Personal history of nicotine dependence: Secondary | ICD-10-CM

## 2016-11-12 DIAGNOSIS — Z6836 Body mass index (BMI) 36.0-36.9, adult: Secondary | ICD-10-CM

## 2016-11-12 DIAGNOSIS — Z79899 Other long term (current) drug therapy: Secondary | ICD-10-CM

## 2016-11-12 DIAGNOSIS — F410 Panic disorder [episodic paroxysmal anxiety] without agoraphobia: Secondary | ICD-10-CM | POA: Diagnosis present

## 2016-11-12 DIAGNOSIS — Z7984 Long term (current) use of oral hypoglycemic drugs: Secondary | ICD-10-CM

## 2016-11-12 DIAGNOSIS — I2510 Atherosclerotic heart disease of native coronary artery without angina pectoris: Secondary | ICD-10-CM

## 2016-11-12 DIAGNOSIS — E669 Obesity, unspecified: Secondary | ICD-10-CM | POA: Diagnosis present

## 2016-11-12 DIAGNOSIS — I160 Hypertensive urgency: Secondary | ICD-10-CM

## 2016-11-12 DIAGNOSIS — Z833 Family history of diabetes mellitus: Secondary | ICD-10-CM

## 2016-11-12 DIAGNOSIS — Z7951 Long term (current) use of inhaled steroids: Secondary | ICD-10-CM

## 2016-11-12 DIAGNOSIS — I214 Non-ST elevation (NSTEMI) myocardial infarction: Secondary | ICD-10-CM | POA: Diagnosis present

## 2016-11-12 DIAGNOSIS — Z955 Presence of coronary angioplasty implant and graft: Secondary | ICD-10-CM

## 2016-11-12 DIAGNOSIS — E876 Hypokalemia: Secondary | ICD-10-CM | POA: Diagnosis present

## 2016-11-12 DIAGNOSIS — E785 Hyperlipidemia, unspecified: Secondary | ICD-10-CM

## 2016-11-12 DIAGNOSIS — E119 Type 2 diabetes mellitus without complications: Secondary | ICD-10-CM | POA: Diagnosis present

## 2016-11-12 DIAGNOSIS — Z8249 Family history of ischemic heart disease and other diseases of the circulatory system: Secondary | ICD-10-CM

## 2016-11-12 HISTORY — DX: Anxiety disorder, unspecified: F41.9

## 2016-11-12 HISTORY — DX: Atherosclerotic heart disease of native coronary artery without angina pectoris: I25.10

## 2016-11-12 HISTORY — DX: Depression, unspecified: F32.A

## 2016-11-12 HISTORY — DX: Gastro-esophageal reflux disease without esophagitis: K21.9

## 2016-11-12 HISTORY — DX: Hypothyroidism, unspecified: E03.9

## 2016-11-12 HISTORY — DX: Sleep apnea, unspecified: G47.30

## 2016-11-12 HISTORY — DX: Migraine, unspecified, not intractable, without status migrainosus: G43.909

## 2016-11-12 HISTORY — DX: Type 2 diabetes mellitus without complications: E11.9

## 2016-11-12 HISTORY — DX: Major depressive disorder, single episode, unspecified: F32.9

## 2016-11-12 HISTORY — DX: Headache: R51

## 2016-11-12 HISTORY — DX: Headache, unspecified: R51.9

## 2016-11-12 HISTORY — DX: Unspecified osteoarthritis, unspecified site: M19.90

## 2016-11-12 LAB — BASIC METABOLIC PANEL
ANION GAP: 9 (ref 5–15)
BUN: 11 mg/dL (ref 6–20)
CO2: 25 mmol/L (ref 22–32)
Calcium: 9.4 mg/dL (ref 8.9–10.3)
Chloride: 100 mmol/L — ABNORMAL LOW (ref 101–111)
Creatinine, Ser: 0.72 mg/dL (ref 0.44–1.00)
GFR calc Af Amer: >60 mL/min (ref >60)
GFR calc non Af Amer: >60 mL/min (ref >60)
GLUCOSE: 422 mg/dL — AB (ref 65–99)
POTASSIUM: 3.5 mmol/L (ref 3.5–5.1)
Sodium: 134 mmol/L — ABNORMAL LOW (ref 135–145)

## 2016-11-12 LAB — CBC
HEMATOCRIT: 34.5 % — AB (ref 36.0–46.0)
HEMOGLOBIN: 12.3 g/dL (ref 12.0–15.0)
MCH: 28.6 pg (ref 26.0–34.0)
MCHC: 35.7 g/dL (ref 30.0–36.0)
MCV: 80.2 fL (ref 78.0–100.0)
PLATELETS: 441 10*3/uL — AB (ref 150–400)
RBC: 4.3 MIL/uL (ref 3.87–5.11)
RDW: 12.3 % (ref 11.5–15.5)
WBC: 8.9 10*3/uL (ref 4.0–10.5)

## 2016-11-12 LAB — TROPONIN I: Troponin I: 0.05 ng/mL (ref <0.03)

## 2016-11-12 MED ORDER — LORAZEPAM 2 MG/ML IJ SOLN
1.0000 mg | Freq: Once | INTRAMUSCULAR | Status: AC
  Administered 2016-11-12: 1 mg via INTRAVENOUS
  Filled 2016-11-12: qty 1

## 2016-11-12 MED ORDER — SODIUM CHLORIDE 0.9 % IV BOLUS (SEPSIS)
1000.0000 mL | Freq: Once | INTRAVENOUS | Status: AC
  Administered 2016-11-12: 1000 mL via INTRAVENOUS

## 2016-11-12 MED ORDER — INSULIN REGULAR HUMAN 100 UNIT/ML IJ SOLN
8.0000 [IU] | Freq: Once | INTRAMUSCULAR | Status: AC
  Administered 2016-11-12: 8 [IU] via INTRAVENOUS
  Filled 2016-11-12: qty 1

## 2016-11-12 NOTE — ED Provider Notes (Signed)
[facility] Provider Note   CSN: [id number] Arrival date & time: 11/12/16  2241  By signing my name below, I, Modena JanskyAlbert Thayil, attest that this documentation has been prepared under the direction and in the presence of Geoffery Lyonselo, Charde Macfarlane, MD. Electronically Signed: Modena JanskyAlbert Thayil, Scribe. 11/12/2016. 11:31 PM.  History   Chief Complaint Chief Complaint  Patient presents with  . Shortness of Breath   The history is provided by the patient. No language interpreter was used.  Shortness of Breath  This is a new problem. The average episode lasts 2 hours. The problem occurs frequently.The current episode started 3 to 5 hours ago. The problem has been rapidly improving. Associated symptoms include chest pain. The problem's precipitants include an emotional upset. She has tried nothing for the symptoms.   HPI Comments: Cynthia Kirby is a 46 y.o. female with a PMHx of DM and HTN who presents to the Emergency Department complaining of intermittent moderate SOB that started about 2 hours ago. She states she had a sudden onset of SOB when she was getting stressed out. She reports associated anxiety (relieved by Ativan given in the ED), chest pain, and calf swelling (bilateral). Her complaint today is different from prior panic attacks. Denies any prior hx of similar complaint or other complaints at this time.  Past Medical History:  Diagnosis Date  . Diabetes mellitus without complication (HCC)   . Hypertension     There are no active problems to display for this patient.   History reviewed. No pertinent surgical history.  OB History    No data available       Home Medications    Prior to Admission medications   Medication Sig Start Date End Date Taking? Authorizing Provider  amLODipine (NORVASC) 10 MG tablet Take 10 mg by mouth daily.    [provider]  fluticasone (FLONASE) 50 MCG/ACT nasal spray Place into both nostrils daily.    [provider]  losartan  (COZAAR) 100 MG tablet Take 100 mg by mouth daily.    [provider]  metFORMIN (GLUCOPHAGE) 1000 MG tablet Take 1,000 mg by mouth 2 (two) times daily with a meal.    [provider]  saxagliptin HCl (ONGLYZA) 2.5 MG TABS tablet     [provider]    Family History No family history on file.  Social History Social History  Substance Use Topics  . Smoking status: Never Smoker  . Smokeless tobacco: Not on file  . Alcohol use No     Allergies   Ciprofloxacin and Zithromax [azithromycin]   Review of Systems Review of Systems  Respiratory: Positive for shortness of breath.   Cardiovascular: Positive for chest pain.  Psychiatric/Behavioral: The patient is nervous/anxious.   All other systems reviewed and are negative.    Physical Exam Updated Vital Signs BP (!) 203/111 (BP Location: Right Arm)   Pulse 99   Temp 98.3 F (36.8 C) (Oral)   Resp 18   Ht 5\' 5"  (1.651 m)   Wt 220 lb (99.8 kg)   LMP 10/18/2016   SpO2 100%   BMI 36.61 kg/m   Physical Exam  Constitutional: She appears well-developed and well-nourished. No distress.  HENT:  Head: Normocephalic.  Eyes: Conjunctivae are normal.  Neck: Neck supple.  Cardiovascular: Normal rate and regular rhythm.   No murmur heard. Pulmonary/Chest: Effort normal. No respiratory distress. She has no wheezes. She has no rales.  Abdominal: Soft. There is no tenderness.  Musculoskeletal: Normal range  of motion.  Neurological: She is alert.  Skin: Skin is warm and dry.  Psychiatric: She has a normal mood and affect.  Nursing note and vitals reviewed.    ED Treatments / Results  DIAGNOSTIC STUDIES: Oxygen Saturation is 100% on RA, normal by my interpretation.    COORDINATION OF CARE: 11:35 PM- Pt advised of plan for treatment and pt agrees.  Labs (all labs ordered are listed, but only abnormal results are displayed) Labs Reviewed  BASIC METABOLIC PANEL - Abnormal; Notable for the following:        Result Value   Sodium 134 (*)    Chloride 100 (*)    Glucose, Bld 422 (*)    All other components within normal limits  CBC - Abnormal; Notable for the following:    HCT 34.5 (*)    Platelets 441 (*)    All other components within normal limits  TROPONIN I    EKG  EKG Interpretation  Date/Time:  Sunday Nov 12 2016 22:52:06 EDT Ventricular Rate:  96 PR Interval:    QRS Duration: 95 QT Interval:  364 QTC Calculation: 460 R Axis:   38 Text Interpretation:  Sinus rhythm Abnormal inferior Q waves No significant change since last tracing Confirmed by Jerelyn Scott [phone]) on 11/12/2016 10:56:31 PM       Radiology Dg Chest 2 View  Result Date: 11/12/2016 CLINICAL DATA:  Chest pain and shortness of breath EXAM: CHEST  2 VIEW COMPARISON:  Chest CT 03/27/2015 FINDINGS: The heart size and mediastinal contours are within normal limits. Both lungs are clear. The visualized skeletal structures are unremarkable. IMPRESSION: No active cardiopulmonary disease. Electronically Signed   By: Deatra Robinson M.D.   On: 11/12/2016 23:17    Procedures Procedures (including critical care time)  Medications Ordered in ED Medications  sodium chloride 0.9 % bolus 1,000 mL (not administered)  insulin regular (NOVOLIN R,HUMULIN R) 100 units/mL injection 8 Units (not administered)  LORazepam (ATIVAN) injection 1 mg (1 mg Intravenous Given 11/12/16 2326)     Initial Impression / Assessment and Plan / ED Course  I have reviewed the triage vital signs and the nursing notes.  Pertinent labs & imaging results that were available during my care of the patient were reviewed by me and considered in my medical decision making (see chart for details).  Patient presents here with complaints of chest pain and dyspnea that started just prior to arrival. She arrives here very anxious and appeared to be having a panic attack. Her symptoms did improve with an initial dose of Ativan.  Her initial EKG revealed  a normal sinus rhythm. Troponin was 0.05, just slightly out of the normal range, However repeat returned at 0.23, with ekg now showing possible ST segment flattening in 1 and aVL.  The care was discussed with Dr. Teresita Madura from Cardiology who has accepted the patient in transfer. Aspirin given, heparin started, and nitroglycerin paste applied.  CRITICAL CARE Performed by: Geoffery Lyons Total critical care time: 45 minutes Critical care time was exclusive of separately billable procedures and treating other patients. Critical care was necessary to treat or prevent imminent or life-threatening deterioration. Critical care was time spent personally by me on the following activities: development of treatment plan with patient and/or surrogate as well as nursing, discussions with consultants, evaluation of patient's response to treatment, examination of patient, obtaining history from patient or surrogate, ordering and performing treatments and interventions, ordering and review of laboratory studies, ordering and review of  radiographic studies, pulse oximetry and re-evaluation of patient's condition.     Final Clinical Impressions(s) / ED Diagnoses   Final diagnoses:  None    New Prescriptions New Prescriptions   No medications on file   I personally performed the services described in this documentation, which was scribed in my presence. The recorded information has been reviewed and is accurate.        Geoffery Lyons, MD 11/13/16 [phone]

## 2016-11-12 NOTE — ED Triage Notes (Signed)
Pt presents to ED with complaints of chest pain and shortness of breath that started around 45 minutes ago. PT states she was laying in bed when this started.  PT crying in triage. PT states she has had panic attacks in the past but feels different this time.

## 2016-11-12 NOTE — ED Notes (Addendum)
Troponin called from phyllis in the lab and reported at 0.05. Dr. Judd Lienelo and Bartholomew CrewsJennaya Davis, RN aware. No further orders at present.

## 2016-11-13 ENCOUNTER — Encounter (HOSPITAL_COMMUNITY): Payer: Self-pay | Admitting: Cardiology

## 2016-11-13 ENCOUNTER — Encounter (HOSPITAL_COMMUNITY)
Admission: EM | Disposition: A | Discharge status: Home / Self Care | Payer: Self-pay | Source: Home / Self Care | Attending: Cardiology

## 2016-11-13 DIAGNOSIS — Z79899 Other long term (current) drug therapy: Secondary | ICD-10-CM | POA: Diagnosis not present

## 2016-11-13 DIAGNOSIS — Z7984 Long term (current) use of oral hypoglycemic drugs: Secondary | ICD-10-CM | POA: Diagnosis not present

## 2016-11-13 DIAGNOSIS — I214 Non-ST elevation (NSTEMI) myocardial infarction: Principal | ICD-10-CM

## 2016-11-13 DIAGNOSIS — I160 Hypertensive urgency: Secondary | ICD-10-CM | POA: Diagnosis not present

## 2016-11-13 DIAGNOSIS — E876 Hypokalemia: Secondary | ICD-10-CM | POA: Diagnosis present

## 2016-11-13 DIAGNOSIS — Z6836 Body mass index (BMI) 36.0-36.9, adult: Secondary | ICD-10-CM | POA: Diagnosis not present

## 2016-11-13 DIAGNOSIS — E785 Hyperlipidemia, unspecified: Secondary | ICD-10-CM | POA: Diagnosis not present

## 2016-11-13 DIAGNOSIS — E119 Type 2 diabetes mellitus without complications: Secondary | ICD-10-CM | POA: Diagnosis present

## 2016-11-13 DIAGNOSIS — Z7951 Long term (current) use of inhaled steroids: Secondary | ICD-10-CM | POA: Diagnosis not present

## 2016-11-13 DIAGNOSIS — I2510 Atherosclerotic heart disease of native coronary artery without angina pectoris: Secondary | ICD-10-CM

## 2016-11-13 DIAGNOSIS — I10 Essential (primary) hypertension: Secondary | ICD-10-CM | POA: Diagnosis present

## 2016-11-13 DIAGNOSIS — E669 Obesity, unspecified: Secondary | ICD-10-CM | POA: Diagnosis present

## 2016-11-13 DIAGNOSIS — I25110 Atherosclerotic heart disease of native coronary artery with unstable angina pectoris: Secondary | ICD-10-CM | POA: Diagnosis not present

## 2016-11-13 DIAGNOSIS — Z833 Family history of diabetes mellitus: Secondary | ICD-10-CM | POA: Diagnosis not present

## 2016-11-13 DIAGNOSIS — F410 Panic disorder [episodic paroxysmal anxiety] without agoraphobia: Secondary | ICD-10-CM | POA: Diagnosis present

## 2016-11-13 DIAGNOSIS — Z8249 Family history of ischemic heart disease and other diseases of the circulatory system: Secondary | ICD-10-CM | POA: Diagnosis not present

## 2016-11-13 DIAGNOSIS — Z87891 Personal history of nicotine dependence: Secondary | ICD-10-CM | POA: Diagnosis not present

## 2016-11-13 HISTORY — PX: LEFT HEART CATH AND CORONARY ANGIOGRAPHY: CATH118249

## 2016-11-13 HISTORY — PX: CORONARY ANGIOPLASTY WITH STENT PLACEMENT: SHX49

## 2016-11-13 HISTORY — PX: CORONARY STENT INTERVENTION: CATH118234

## 2016-11-13 LAB — HIV ANTIBODY (ROUTINE TESTING W REFLEX): HIV SCREEN 4TH GENERATION: NONREACTIVE

## 2016-11-13 LAB — CBC WITH DIFFERENTIAL/PLATELET
Basophils Absolute: 0 10*3/uL (ref 0.0–0.1)
Basophils Relative: 0 %
EOS ABS: 0.2 10*3/uL (ref 0.0–0.7)
Eosinophils Relative: 2 %
HCT: 34 % — ABNORMAL LOW (ref 36.0–46.0)
HEMOGLOBIN: 11.5 g/dL — AB (ref 12.0–15.0)
LYMPHS ABS: 4.6 10*3/uL — AB (ref 0.7–4.0)
Lymphocytes Relative: 43 %
MCH: 27.5 pg (ref 26.0–34.0)
MCHC: 33.8 g/dL (ref 30.0–36.0)
MCV: 81.3 fL (ref 78.0–100.0)
Monocytes Absolute: 0.5 10*3/uL (ref 0.1–1.0)
Monocytes Relative: 5 %
NEUTROS ABS: 5.3 10*3/uL (ref 1.7–7.7)
NEUTROS PCT: 50 %
Platelets: 438 10*3/uL — ABNORMAL HIGH (ref 150–400)
RBC: 4.18 MIL/uL (ref 3.87–5.11)
RDW: 12.6 % (ref 11.5–15.5)
WBC: 10.6 10*3/uL — ABNORMAL HIGH (ref 4.0–10.5)

## 2016-11-13 LAB — COMPREHENSIVE METABOLIC PANEL
ALBUMIN: 3.5 g/dL (ref 3.5–5.0)
ALT: 12 U/L — ABNORMAL LOW (ref 14–54)
AST: 15 U/L (ref 15–41)
Alkaline Phosphatase: 58 U/L (ref 38–126)
Anion gap: 11 (ref 5–15)
BUN: 6 mg/dL (ref 6–20)
CHLORIDE: 102 mmol/L (ref 101–111)
CO2: 23 mmol/L (ref 22–32)
Calcium: 8.8 mg/dL — ABNORMAL LOW (ref 8.9–10.3)
Creatinine, Ser: 0.63 mg/dL (ref 0.44–1.00)
GFR calc Af Amer: >60 mL/min (ref >60)
GLUCOSE: 248 mg/dL — AB (ref 65–99)
POTASSIUM: 3.7 mmol/L (ref 3.5–5.1)
SODIUM: 136 mmol/L (ref 135–145)
Total Bilirubin: 0.4 mg/dL (ref 0.3–1.2)
Total Protein: 6.5 g/dL (ref 6.5–8.1)

## 2016-11-13 LAB — PREGNANCY, URINE: Preg Test, Ur: NEGATIVE

## 2016-11-13 LAB — D-DIMER, QUANTITATIVE: D-Dimer, Quant: <0.27 ug/mL-FEU (ref 0.00–0.50)

## 2016-11-13 LAB — TROPONIN I
TROPONIN I: 0.23 ng/mL — AB (ref <0.03)
TROPONIN I: 0.59 ng/mL — AB (ref <0.03)

## 2016-11-13 LAB — LIPID PANEL
CHOL/HDL RATIO: 5.3 ratio
CHOLESTEROL: 208 mg/dL — AB (ref 0–200)
HDL: 39 mg/dL — ABNORMAL LOW (ref >40)
LDL Cholesterol: 120 mg/dL — ABNORMAL HIGH (ref 0–99)
TRIGLYCERIDES: 244 mg/dL — AB (ref <150)
VLDL: 49 mg/dL — AB (ref 0–40)

## 2016-11-13 LAB — BRAIN NATRIURETIC PEPTIDE: B Natriuretic Peptide: 67.6 pg/mL (ref 0.0–100.0)

## 2016-11-13 LAB — PROTIME-INR
INR: 0.99
Prothrombin Time: 13.1 seconds (ref 11.4–15.2)

## 2016-11-13 LAB — APTT: APTT: 49 s — AB (ref 24–36)

## 2016-11-13 LAB — HEPARIN LEVEL (UNFRACTIONATED): HEPARIN UNFRACTIONATED: 0.22 [IU]/mL — AB (ref 0.30–0.70)

## 2016-11-13 LAB — CBG MONITORING, ED: Glucose-Capillary: 311 mg/dL — ABNORMAL HIGH (ref 65–99)

## 2016-11-13 LAB — GLUCOSE, CAPILLARY
GLUCOSE-CAPILLARY: 158 mg/dL — AB (ref 65–99)
Glucose-Capillary: 229 mg/dL — ABNORMAL HIGH (ref 65–99)
Glucose-Capillary: 193 mg/dL — ABNORMAL HIGH (ref 65–99)
Glucose-Capillary: 219 mg/dL — ABNORMAL HIGH (ref 65–99)

## 2016-11-13 LAB — POCT ACTIVATED CLOTTING TIME: Activated Clotting Time: 312 seconds

## 2016-11-13 SURGERY — LEFT HEART CATH AND CORONARY ANGIOGRAPHY
Anesthesia: LOCAL

## 2016-11-13 MED ORDER — HEPARIN BOLUS VIA INFUSION
4000.0000 [IU] | Freq: Once | INTRAVENOUS | Status: AC
  Administered 2016-11-13: 4000 [IU] via INTRAVENOUS

## 2016-11-13 MED ORDER — SODIUM CHLORIDE 0.9% FLUSH: 3.0000 mL | INTRAVENOUS | Status: DC | PRN

## 2016-11-13 MED ORDER — MIDAZOLAM HCL 2 MG/2ML IJ SOLN
INTRAMUSCULAR | Status: AC
  Filled 2016-11-13: qty 2

## 2016-11-13 MED ORDER — INSULIN ASPART 100 UNIT/ML ~~LOC~~ SOLN
0.0000 [IU] | Freq: Every day | SUBCUTANEOUS | Status: DC
  Administered 2016-11-13: 21:00:00 2 [IU] via SUBCUTANEOUS

## 2016-11-13 MED ORDER — ONDANSETRON HCL 4 MG/2ML IJ SOLN: 4.0000 mg | Freq: Four times a day (QID) | INTRAMUSCULAR | Status: DC | PRN

## 2016-11-13 MED ORDER — TIROFIBAN (AGGRASTAT) BOLUS VIA INFUSION
INTRAVENOUS | Status: DC | PRN
  Administered 2016-11-13: 2505 ug via INTRAVENOUS

## 2016-11-13 MED ORDER — ASPIRIN 81 MG PO CHEW
324.0000 mg | CHEWABLE_TABLET | Freq: Once | ORAL | Status: AC
  Administered 2016-11-13: 324 mg via ORAL
  Filled 2016-11-13: qty 4

## 2016-11-13 MED ORDER — METOPROLOL TARTRATE 5 MG/5ML IV SOLN
2.5000 mg | Freq: Once | INTRAVENOUS | Status: AC
  Administered 2016-11-13: 2.5 mg via INTRAVENOUS
  Filled 2016-11-13: qty 5

## 2016-11-13 MED ORDER — HEPARIN SODIUM (PORCINE) 1000 UNIT/ML IJ SOLN
INTRAMUSCULAR | Status: AC
  Filled 2016-11-13: qty 1

## 2016-11-13 MED ORDER — TICAGRELOR 90 MG PO TABS
90.0000 mg | ORAL_TABLET | Freq: Two times a day (BID) | ORAL | Status: DC
  Administered 2016-11-14 (×2): 90 mg via ORAL
  Filled 2016-11-13 (×2): qty 1

## 2016-11-13 MED ORDER — HEART ATTACK BOUNCING BOOK
Freq: Once | Status: AC
  Administered 2016-11-13: 20:00:00
  Filled 2016-11-13: qty 1

## 2016-11-13 MED ORDER — NITROGLYCERIN 2 % TD OINT
0.5000 [in_us] | TOPICAL_OINTMENT | Freq: Once | TRANSDERMAL | Status: AC
  Administered 2016-11-13: 0.5 [in_us] via TOPICAL
  Filled 2016-11-13: qty 1

## 2016-11-13 MED ORDER — LINAGLIPTIN 5 MG PO TABS
5.0000 mg | ORAL_TABLET | Freq: Every day | ORAL | Status: DC
  Administered 2016-11-14: 5 mg via ORAL
  Filled 2016-11-13: qty 1

## 2016-11-13 MED ORDER — HYDRALAZINE HCL 20 MG/ML IJ SOLN
INTRAMUSCULAR | Status: AC
  Filled 2016-11-13: qty 1

## 2016-11-13 MED ORDER — ACETAMINOPHEN 325 MG PO TABS: 650.0000 mg | ORAL_TABLET | ORAL | Status: DC | PRN

## 2016-11-13 MED ORDER — FLUTICASONE PROPIONATE 50 MCG/ACT NA SUSP
1.0000 | Freq: Every day | NASAL | Status: DC
  Administered 2016-11-13: 1 via NASAL
  Filled 2016-11-13 (×2): qty 16

## 2016-11-13 MED ORDER — SODIUM CHLORIDE 0.9 % IV SOLN: 250.0000 mL | INTRAVENOUS | Status: DC | PRN

## 2016-11-13 MED ORDER — TICAGRELOR 90 MG PO TABS
ORAL_TABLET | ORAL | Status: DC | PRN
  Administered 2016-11-13: 180 mg via ORAL

## 2016-11-13 MED ORDER — ASPIRIN 81 MG PO CHEW
81.0000 mg | CHEWABLE_TABLET | Freq: Every day | ORAL | Status: DC
  Administered 2016-11-14: 81 mg via ORAL
  Filled 2016-11-13: qty 1

## 2016-11-13 MED ORDER — IOHEXOL 350 MG/ML SOLN
INTRAVENOUS | Status: DC | PRN
  Administered 2016-11-13: 100 mL via INTRA_ARTERIAL

## 2016-11-13 MED ORDER — CLOPIDOGREL BISULFATE 300 MG PO TABS
ORAL_TABLET | ORAL | Status: AC
  Filled 2016-11-13: qty 2

## 2016-11-13 MED ORDER — FENTANYL CITRATE (PF) 100 MCG/2ML IJ SOLN
INTRAMUSCULAR | Status: AC
  Filled 2016-11-13: qty 2

## 2016-11-13 MED ORDER — TIROFIBAN HCL IN NACL 5-0.9 MG/100ML-% IV SOLN
INTRAVENOUS | Status: AC | PRN
  Administered 2016-11-13: 0.15 ug/kg/min via INTRAVENOUS

## 2016-11-13 MED ORDER — HEPARIN (PORCINE) IN NACL 2-0.9 UNIT/ML-% IJ SOLN
INTRAMUSCULAR | Status: AC
  Filled 2016-11-13: qty 1000

## 2016-11-13 MED ORDER — NITROGLYCERIN 0.4 MG SL SUBL: 0.4000 mg | SUBLINGUAL_TABLET | SUBLINGUAL | Status: DC | PRN

## 2016-11-13 MED ORDER — SODIUM CHLORIDE 0.9% FLUSH
3.0000 mL | Freq: Two times a day (BID) | INTRAVENOUS | Status: DC
  Administered 2016-11-13: 3 mL via INTRAVENOUS

## 2016-11-13 MED ORDER — MIDAZOLAM HCL 2 MG/2ML IJ SOLN
INTRAMUSCULAR | Status: DC | PRN
  Administered 2016-11-13: 1 mg via INTRAVENOUS
  Administered 2016-11-13: 2 mg via INTRAVENOUS
  Administered 2016-11-13: 1 mg via INTRAVENOUS

## 2016-11-13 MED ORDER — AMLODIPINE BESYLATE 10 MG PO TABS
10.0000 mg | ORAL_TABLET | Freq: Every day | ORAL | Status: DC
  Administered 2016-11-13 – 2016-11-14 (×2): 10 mg via ORAL
  Filled 2016-11-13 (×2): qty 1

## 2016-11-13 MED ORDER — LIDOCAINE HCL (PF) 1 % IJ SOLN
INTRAMUSCULAR | Status: DC | PRN
  Administered 2016-11-13: 2 mL

## 2016-11-13 MED ORDER — HEPARIN SODIUM (PORCINE) 1000 UNIT/ML IJ SOLN
INTRAMUSCULAR | Status: DC | PRN
  Administered 2016-11-13 (×2): 5000 [IU] via INTRAVENOUS

## 2016-11-13 MED ORDER — SODIUM CHLORIDE 0.9% FLUSH: 3.0000 mL | Freq: Two times a day (BID) | INTRAVENOUS | Status: DC

## 2016-11-13 MED ORDER — SODIUM CHLORIDE 0.9 % IV SOLN
INTRAVENOUS | Status: AC
  Administered 2016-11-13: 16:00:00 via INTRAVENOUS

## 2016-11-13 MED ORDER — SODIUM CHLORIDE 0.9 % WEIGHT BASED INFUSION: 1.0000 mL/kg/h | INTRAVENOUS | Status: DC

## 2016-11-13 MED ORDER — THE SENSUOUS HEART BOOK
Freq: Once | Status: AC
Start: 2016-11-13 — End: 2016-11-13
  Administered 2016-11-13: 20:00:00
  Filled 2016-11-13: qty 1

## 2016-11-13 MED ORDER — KETOROLAC TROMETHAMINE 30 MG/ML IJ SOLN
30.0000 mg | Freq: Once | INTRAMUSCULAR | Status: AC
  Administered 2016-11-13: 30 mg via INTRAVENOUS
  Filled 2016-11-13: qty 1

## 2016-11-13 MED ORDER — METOPROLOL TARTRATE 25 MG PO TABS
25.0000 mg | ORAL_TABLET | Freq: Two times a day (BID) | ORAL | Status: DC
  Administered 2016-11-13 (×2): 25 mg via ORAL
  Filled 2016-11-13 (×2): qty 1

## 2016-11-13 MED ORDER — NITROGLYCERIN 0.4 MG SL SUBL
0.4000 mg | SUBLINGUAL_TABLET | SUBLINGUAL | Status: DC | PRN
  Administered 2016-11-13 (×2): 0.4 mg via SUBLINGUAL
  Filled 2016-11-13: qty 1

## 2016-11-13 MED ORDER — ONDANSETRON HCL 4 MG/2ML IJ SOLN
4.0000 mg | Freq: Four times a day (QID) | INTRAMUSCULAR | Status: DC | PRN
  Administered 2016-11-13: 4 mg via INTRAVENOUS
  Filled 2016-11-13: qty 2

## 2016-11-13 MED ORDER — VERAPAMIL HCL 2.5 MG/ML IV SOLN
INTRAVENOUS | Status: DC | PRN
  Administered 2016-11-13 (×2): 10 mL via INTRA_ARTERIAL

## 2016-11-13 MED ORDER — HYDRALAZINE HCL 20 MG/ML IJ SOLN: 5.0000 mg | INTRAMUSCULAR | Status: AC | PRN

## 2016-11-13 MED ORDER — IOPAMIDOL (ISOVUE-370) INJECTION 76%
INTRAVENOUS | Status: AC
  Filled 2016-11-13: qty 50

## 2016-11-13 MED ORDER — TICAGRELOR 90 MG PO TABS
ORAL_TABLET | ORAL | Status: AC
  Filled 2016-11-13: qty 2

## 2016-11-13 MED ORDER — HEPARIN (PORCINE) IN NACL 100-0.45 UNIT/ML-% IJ SOLN
1250.0000 [IU]/h | INTRAMUSCULAR | Status: DC
  Administered 2016-11-13: 1100 [IU]/h via INTRAVENOUS
  Filled 2016-11-13: qty 250

## 2016-11-13 MED ORDER — ACTIVE PARTNERSHIP FOR HEALTH OF YOUR HEART BOOK
Freq: Once | Status: AC
  Administered 2016-11-13: 20:00:00
  Filled 2016-11-13: qty 1

## 2016-11-13 MED ORDER — FENTANYL CITRATE (PF) 100 MCG/2ML IJ SOLN
INTRAMUSCULAR | Status: DC | PRN
  Administered 2016-11-13 (×3): 25 ug via INTRAVENOUS

## 2016-11-13 MED ORDER — SODIUM CHLORIDE 0.9 % WEIGHT BASED INFUSION: 3.0000 mL/kg/h | INTRAVENOUS | Status: DC

## 2016-11-13 MED ORDER — HEPARIN (PORCINE) IN NACL 2-0.9 UNIT/ML-% IJ SOLN
INTRAMUSCULAR | Status: AC | PRN
  Administered 2016-11-13: 1000 mL

## 2016-11-13 MED ORDER — ATORVASTATIN CALCIUM 80 MG PO TABS
80.0000 mg | ORAL_TABLET | Freq: Every day | ORAL | Status: DC
  Administered 2016-11-13: 21:00:00 80 mg via ORAL
  Filled 2016-11-13: qty 1

## 2016-11-13 MED ORDER — VERAPAMIL HCL 2.5 MG/ML IV SOLN
INTRAVENOUS | Status: AC
  Filled 2016-11-13: qty 2

## 2016-11-13 MED ORDER — LOSARTAN POTASSIUM 50 MG PO TABS
100.0000 mg | ORAL_TABLET | Freq: Every day | ORAL | Status: DC
  Administered 2016-11-13 – 2016-11-14 (×2): 100 mg via ORAL
  Filled 2016-11-13 (×2): qty 2

## 2016-11-13 MED ORDER — ASPIRIN EC 81 MG PO TBEC: 81.0000 mg | DELAYED_RELEASE_TABLET | Freq: Every day | ORAL | Status: DC

## 2016-11-13 MED ORDER — ASPIRIN 81 MG PO CHEW: 81.0000 mg | CHEWABLE_TABLET | ORAL | Status: DC

## 2016-11-13 MED ORDER — LIDOCAINE HCL (PF) 1 % IJ SOLN
INTRAMUSCULAR | Status: AC
  Filled 2016-11-13: qty 30

## 2016-11-13 MED ORDER — LABETALOL HCL 5 MG/ML IV SOLN: 10.0000 mg | INTRAVENOUS | Status: AC | PRN

## 2016-11-13 MED ORDER — ANGIOPLASTY BOOK
Freq: Once | Status: AC
  Administered 2016-11-13: 20:00:00
  Filled 2016-11-13: qty 1

## 2016-11-13 MED ORDER — IOPAMIDOL (ISOVUE-370) INJECTION 76%
INTRAVENOUS | Status: AC
  Filled 2016-11-13: qty 100

## 2016-11-13 MED ORDER — INSULIN ASPART 100 UNIT/ML ~~LOC~~ SOLN
0.0000 [IU] | Freq: Three times a day (TID) | SUBCUTANEOUS | Status: DC
  Administered 2016-11-13 (×2): 3 [IU] via SUBCUTANEOUS
  Administered 2016-11-13: 5 [IU] via SUBCUTANEOUS
  Administered 2016-11-14 (×2): 3 [IU] via SUBCUTANEOUS

## 2016-11-13 MED ORDER — ACETAMINOPHEN 325 MG PO TABS
650.0000 mg | ORAL_TABLET | ORAL | Status: DC | PRN
  Administered 2016-11-13: 650 mg via ORAL
  Filled 2016-11-13: qty 2

## 2016-11-13 MED ORDER — TIROFIBAN HCL IN NACL 5-0.9 MG/100ML-% IV SOLN
INTRAVENOUS | Status: AC
  Filled 2016-11-13: qty 100

## 2016-11-13 MED ORDER — HYDRALAZINE HCL 20 MG/ML IJ SOLN
INTRAMUSCULAR | Status: DC | PRN
  Administered 2016-11-13: 10 mg via INTRAVENOUS

## 2016-11-13 SURGICAL SUPPLY — 21 items
BALLN MOZEC 2.50X14 (BALLOONS) ×2
BALLN ~~LOC~~ EMERGE MR 3.5X20 (BALLOONS) ×2
BALLOON MOZEC 2.50X14 (BALLOONS) ×1 IMPLANT
BALLOON ~~LOC~~ EMERGE MR 3.5X20 (BALLOONS) ×1 IMPLANT
CATH 5FR JL3.5 JR4 ANG PIG MP (CATHETERS) ×2 IMPLANT
CATH LAUNCHER 6FR EBU 3 (CATHETERS) ×2 IMPLANT
COVER PRB 48X5XTLSCP FOLD TPE (BAG) ×1 IMPLANT
COVER PROBE 5X48 (BAG) ×1
DEVICE RAD COMP TR BAND LRG (VASCULAR PRODUCTS) ×2 IMPLANT
GLIDESHEATH SLEND SS 6F .021 (SHEATH) ×2 IMPLANT
GUIDEWIRE INQWIRE 1.5J.035X260 (WIRE) ×1 IMPLANT
INQWIRE 1.5J .035X260CM (WIRE) ×2
KIT ENCORE 26 ADVANTAGE (KITS) ×2 IMPLANT
KIT HEART LEFT (KITS) ×2 IMPLANT
PACK CARDIAC CATHETERIZATION (CUSTOM PROCEDURE TRAY) ×2 IMPLANT
STENT SYNERGY DES 3X32 (Permanent Stent) ×2 IMPLANT
TRANSDUCER W/STOPCOCK (MISCELLANEOUS) ×2 IMPLANT
TUBING CIL FLEX 10 FLL-RA (TUBING) ×2 IMPLANT
VALVE GUARDIAN II ~~LOC~~ HEMO (MISCELLANEOUS) ×2 IMPLANT
WIRE ASAHI PROWATER 180CM (WIRE) ×2 IMPLANT
WIRE HI TORQ VERSACORE-J 145CM (WIRE) ×2 IMPLANT

## 2016-11-13 NOTE — ED Notes (Signed)
Sat with pt for several minutes and talked about ways to calm and relax herself. 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 Pt also listening to music. Family members remain at bedside.

## 2016-11-13 NOTE — ED Notes (Signed)
[facility] here to transport pt to [facility]  

## 2016-11-13 NOTE — ED Notes (Signed)
CBG 311 

## 2016-11-13 NOTE — Interval H&P Note (Signed)
Cath Lab Visit (complete for each Cath Lab visit)  Clinical Evaluation Leading to the Procedure:   ACS: Yes.    Non-ACS:    Anginal Classification: CCS IV  Anti-ischemic medical therapy: Minimal Therapy (1 class of medications)  Non-Invasive Test Results: No non-invasive testing performed  Prior CABG: No previous CABG      History and Physical Interval Note:  11/13/2016 2:35 PM  Cynthia Kirby  has presented today for surgery, with the diagnosis of n stemi  The various methods of treatment have been discussed with the patient and family. After consideration of risks, benefits and other options for treatment, the patient has consented to  Procedure(s): Left Heart Cath and Coronary Angiography (N/A) as a surgical intervention .  The patient's history has been reviewed, patient examined, no change in status, stable for surgery.  I have reviewed the patient's chart and labs.  Questions were answered to the patient's satisfaction.     Lance MussJayadeep Drexler Maland

## 2016-11-13 NOTE — ED Notes (Signed)
Report given to [facility] 

## 2016-11-13 NOTE — Progress Notes (Signed)
CRITICAL VALUE STICKER  CRITICAL VALUE:0.59 Troponion  RECEIVER (on-site recipient of call): Governor SpeckingKathryn Williams  DATE & TIME NOTIFIED:5/21 8:01 AM    MESSENGER (representative from lab): Velna HatchetSheila  MD NOTIFIED: Coralee NorthNina PA  TIME OF NOTIFICATION:0755 am  RESPONSE: No knew order at this time, Pt. Schedule for procedure

## 2016-11-13 NOTE — Care Management Note (Addendum)
Case Management Note  Patient Details  Name: Cynthia Kirby MRN: [id number] Date of Birth: [birth year]  Subjective/Objective:  S/p stent intervention, pta indep, will be on brilinta.  NCM gave patient co pay amt and 30 day savings card, [facility].  She is for poss dc today.                 Action/Plan: NCM awaiting benefit check for brilinta.  Expected Discharge Date:  11/15/16               Expected Discharge Plan:  Home/Self Care  In-House Referral:     Discharge planning Services  CM Consult  Post Acute Care Choice:    Choice offered to:     DME Arranged:    DME Agency:     HH Arranged:    HH Agency:     Status of Service:  In process, will continue to follow  If discussed at Long Length of Stay Meetings, dates discussed:    Additional Comments:  Leone Havenaylor, Troy Hartzog Clinton, RN 11/13/2016, 5:37 PM

## 2016-11-13 NOTE — Progress Notes (Signed)
ANTICOAGULATION CONSULT NOTE - Initial Consult  Pharmacy Consult for heparin Indication: chest pain/ACS  Allergies  Allergen Reactions  . Ciprofloxacin Nausea And Vomiting  . Zithromax [Azithromycin]     Patient Measurements: Height: 5\' 5"  (165.1 cm) Weight: 220 lb (99.8 kg) IBW/kg (Calculated) : 57 Heparin Dosing Weight: 80kg  Vital Signs: Temp: 98.3 F (36.8 C) (05/20 2250) Temp Source: Oral (05/20 2250) BP: 119/67 (05/21 0220) Pulse Rate: 103 (05/21 0220)  Labs:  Recent Labs  11/12/16 2301 11/13/16 0153  HGB 12.3  --   HCT 34.5*  --   PLT 441*  --   CREATININE 0.72  --   TROPONINI 0.05* 0.23*    Estimated Creatinine Clearance: 106.1 mL/min (by C-G formula based on SCr of 0.72 mg/dL).   Medical History: Past Medical History:  Diagnosis Date  . Diabetes mellitus without complication (HCC)   . Hypertension     Assessment: 46yo female c/o CP that feels different from prior panic attacks, troponin elevated and trending up, to begin heparin.  Goal of Therapy:  Heparin level 0.3-0.7 units/ml Monitor platelets by anticoagulation protocol: Yes   Plan:  Will give heparin 4000 units IV bolus x1 followed by gtt at 1100 units/hr and monitor heparin levels and CBC.  Vernard GamblesVeronda Nigil Braman, PharmD, BCPS  11/13/2016,2:49 AM

## 2016-11-13 NOTE — ED Notes (Addendum)
Alert, NAD, calm, interactive, resps e/u, speaking in clear complete sentences, no dyspnea noted. Up to b/r, steady gait. Denies dizziness or other sx. States, "usually have vertigo". Family with pt.

## 2016-11-13 NOTE — Progress Notes (Signed)
ANTICOAGULATION CONSULT NOTE  Pharmacy Consult for heparin Indication: chest pain/ACS  Allergies  Allergen Reactions  . Penicillins Anaphylaxis and Itching  . Ciprofloxacin Nausea And Vomiting and Other (See Comments)    Hallucination  . Zithromax [Azithromycin] Other (See Comments)    Hallucination    Patient Measurements: Height: 5\' 5"  (165.1 cm) Weight: 220 lb (99.8 kg) IBW/kg (Calculated) : 57 Heparin Dosing Weight: 80kg  Vital Signs: Temp: 98.3 F (36.8 C) (05/21 0502) Temp Source: Oral (05/21 0502) BP: 142/88 (05/21 0502) Pulse Rate: 89 (05/21 0502)  Labs:  Recent Labs  11/12/16 2301 11/13/16 0153 11/13/16 0627 11/13/16 0843  HGB 12.3  --  11.5*  --   HCT 34.5*  --  34.0*  --   PLT 441*  --  438*  --   APTT  --   --  49*  --   LABPROT  --   --  13.1  --   INR  --   --  0.99  --   HEPARINUNFRC  --   --   --  0.22*  CREATININE 0.72  --  0.63  --   TROPONINI 0.05* 0.23* 0.59*  --     Estimated Creatinine Clearance: 106.1 mL/min (by C-G formula based on SCr of 0.63 mg/dL).   Medical History: Past Medical History:  Diagnosis Date  . Diabetes mellitus without complication (HCC)   . Hypertension     Assessment: 46yo female c/o CP that feels different from prior panic attacks, troponin elevated and trending up, continuing on heparin per Pharmacy. Heparin level low this AM at 0.22 on 1100 units/h. CBC stable. No bleed documented. Cath for today.  Goal of Therapy:  Heparin level 0.3-0.7 units/ml Monitor platelets by anticoagulation protocol: Yes   Plan:  Increase heparin to 1250 units/hr 6h heparin level Daily heparin level/CBC Monitor for s/sx bleeding Cath scheduled for today   Babs BertinHaley Gabrelle Roca, PharmD, BCPS Clinical Pharmacist 11/13/2016 9:36 AM

## 2016-11-13 NOTE — ED Notes (Signed)
Report given to Judeth CornfieldStephanie, [facility] fundraiserN [facility]

## 2016-11-13 NOTE — Progress Notes (Signed)
Received [facility] from [facility]. Admitting MD paged, new orders carried out. Pt's VSS, rates current chest pain 1/10, states that it's "barely there". Tele box placed, verified x2. Pt and pt's siblings updated on plan of care. Will continue to monitor.  Margarito LinerStephanie M Elaine Roanhorse, RN

## 2016-11-13 NOTE — H&P (Signed)
History & Physical    Patient ID: Cynthia Kirby MRN: [id number], DOB/AGE: [birth year]   Admit date: 11/12/2016   Primary Physician: System, Pcp Not In Primary Cardiologist: New  History of Present Illness    Cynthia Kirby is a 46 y.o. female with past medical history of diabetes type 2, and hypertension who presented to the [facility] ED for evaluation of intermittent shortness of breath, chest pain and calf swelling (bilateral) that was of sudden onset with she was getting stressed out. She reported anxiety which was relieved by Ativan in the ED. Her presenting complaints were different from prior panic attacks.   The patient is very busy and stressed. 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 She was taking a dya off yesterday and was sitting home watching TV. She did not take her losartan or amlodipine in the morning and she had eaten a pot pie that was high in sodium. While sitting she developed difficulty breathing and she couldn't talk speak. She had central chest pressure. This lasted for 15 minutes. 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 Her family came around to assist her. She then took her medications and took her BP which was 181/120. She did more breathing exercises and BP came down to 161/109. With all of her family trying to help her, the chest pain came back. This continued until she was seen at the [facility] med center and was given NTG that resolved all of her complaints. No chest pain or shortness of breath since, but her chest feels hollow.  She has had anxiety and panic attacks in the past, but this was different and much worse. Prior to this episode the patient was very active and had no exertional chest discomfort of shortness of breath. She does not have any known family history of heart  disease. She has been hypertensive since age 74, treated. She has type 2 diabetes on Metformin.   Her troponins were 0.05 and then 0.23. Her first EKG was normal sinus rhythm, but subsequent EKG showed possible ST segment flattening in 1 and aVL. She was then transferred to [facility] for further cardiac evaluation. A heparin drip was initiated.   Past Medical History    Past Medical History:  Diagnosis Date  . Diabetes mellitus without complication (HCC)   . Hypertension     History reviewed. No pertinent surgical history.   Allergies  Allergies  Allergen Reactions  . Ciprofloxacin Nausea And Vomiting  . Zithromax [Azithromycin]     Home Medications    Prior to Admission medications   Medication Sig Start Date End Date Taking? Authorizing Provider  amLODipine (NORVASC) 10 MG tablet Take 10 mg by mouth daily.    [provider]  fluticasone (FLONASE) 50 MCG/ACT nasal spray Place into both nostrils daily.    [provider]  losartan (COZAAR) 100 MG tablet Take 100 mg by mouth daily.    [provider]  metFORMIN (GLUCOPHAGE) 1000 MG tablet Take 1,000 mg by mouth 2 (two) times daily with a meal.    [provider]  saxagliptin HCl (ONGLYZA) 2.5 MG TABS tablet     [provider]    Family History    Family History  Problem Relation Age of Onset  . Hypertension  Mother   . Diabetes Mother   . Hypertension Father   . Diabetes Father        borderline  . Healthy Sister   . Healthy Brother      Social History    Social History   Social History  . Marital status: Single    Spouse name: N/A  . Number of children: N/A  . Years of education: N/A   Occupational History  . Not on file.   Social History Main Topics  . Smoking status: Never Smoker  . Smokeless tobacco: Not on file  . Alcohol use No  . Drug use: No  . Sexual activity: No   Other Topics Concern  . Not on file   Social History Narrative  . No narrative  on file     Review of Systems   General:  No chills, fever, night sweats or weight changes.  Cardiovascular:  No chest pain, dyspnea on exertion, edema, orthopnea, palpitations, paroxysmal nocturnal dyspnea. Dermatological: No rash, lesions/masses Respiratory: No cough, dyspnea Urologic: No hematuria, dysuria Abdominal:   No nausea, vomiting, diarrhea, bright red blood per rectum, melena, or hematemesis Neurologic:  No visual changes, wkns, changes in mental status. All other systems reviewed and are otherwise negative except as noted above.  Physical Exam    Blood pressure (!) 142/88, pulse 89, temperature 98.3 F (36.8 C), temperature source Oral, resp. rate 18, height 5\' 5"  (1.651 m), weight 220 lb (99.8 kg), last menstrual period 10/18/2016, SpO2 100 %.  General: Well developed, well nourished,female in no acute distress. Head: Normocephalic, atraumatic, sclera non-icteric, no xanthomas, nares are without discharge. Dentition:  Neck: No carotid bruits. JVD not elevated.  Lungs: Respirations regular and unlabored, without wheezes or rales.  Heart: Regular rate and rhythm. No S3 or S4.  No murmur, no rubs, or gallops appreciated. Abdomen: Soft, non-tender, non-distended with normoactive bowel sounds. No hepatomegaly. No rebound/guarding. No obvious abdominal masses. Msk:  Strength and tone appear normal for age. No joint deformities or effusions. Extremities: No clubbing or cyanosis. No edema.  Distal pedal pulses are 2+ bilaterally. Neuro: Alert and oriented X 3. Moves all extremities spontaneously. No focal deficits noted. Psych:  Responds to questions appropriately with a normal affect. Skin: No rashes or lesions noted  Labs    Troponin (Point of Care Test) No results for input(s): TROPIPOC in the last 72 hours.  Recent Labs  11/12/16 2301 11/13/16 0153  TROPONINI 0.05* 0.23*   Lab Results  Component Value Date   WBC 10.6 (H) 11/13/2016   HGB 11.5 (L) 11/13/2016    HCT 34.0 (L) 11/13/2016   MCV 81.3 11/13/2016   PLT 438 (H) 11/13/2016     Recent Labs Lab 11/12/16 2301  NA 134*  K 3.5  CL 100*  CO2 25  BUN 11  CREATININE 0.72  CALCIUM 9.4  GLUCOSE 422*   Lab Results  Component Value Date   CHOL 208 (H) 11/13/2016   HDL 39 (L) 11/13/2016   LDLCALC 120 (H) 11/13/2016   TRIG 244 (H) 11/13/2016   Lab Results  Component Value Date   DDIMER <0.27 11/13/2016     B Natriuretic Peptide  Date/Time Value Ref Range Status  11/13/2016 06:27 AM 67.6 0.0 - 100.0 pg/mL Final   No results found for: PROBNP  Recent Labs  11/13/16 0627  INR 0.99      Radiology Studies    Dg Chest 2 View  Result Date: 11/12/2016 CLINICAL DATA:  Chest pain and shortness of breath EXAM: CHEST  2 VIEW COMPARISON:  Chest CT 03/27/2015 FINDINGS: The heart size and mediastinal contours are within normal limits. Both lungs are clear. The visualized skeletal structures are unremarkable. IMPRESSION: No active cardiopulmonary disease. Electronically Signed   By: Deatra RobinsonKevin  Herman M.D.   On: 11/12/2016 23:17    EKG & Cardiac Imaging    EKG:  11/12/16: NSR 96 bpm 11/13/16 0102:  NSR with flattening of the T waves in lead I and AVL 11/13/16 0521: NSR with continued flattening of the T waves in leads I and AVL  ECHOCARDIOGRAM:  Assessment & Plan    Active Problems:   NSTEMI (non-ST elevated myocardial infarction) (HCC)  Chest pain -Admitted with chest pain and shortness of breath relieved by NTG, sudden onset with no prior exertional symptoms -Troponins mildly positive and rising. 0.05-->0.23-->0.59 -Subtle EKG changes -NSTEMI may be due to CAD vs coronary artery spasm (pt is very stressed and did not take her amlodipine) vs hypertensive urgency -started on BB and statin. On heparin and aspirin -Plan for cardiac catheterization today for definitive evaluation of coronary arteries.   Hypertension -HTN since age 89 -Did not take her meds yesterday morning and ate  a high sodium meal -BP at home during symptoms was 181/120 -BP improved back on home meds: amlodipine 10 mg and losartan 100 mg. Metoprolol added.  Diabetes Hold metformin -AC/HS CBGs and SSI  Hyperlipidemia -Lipid panel 11/13/16: TC 208, LDL 120, Trig 244 -Statin initiated  SignedBerton Bon, Lelani Garnett, NP-C 11/13/2016, 7:52 AM Pager: [phone]

## 2016-11-13 NOTE — Progress Notes (Signed)
Heparin drip increased to 1250 unit hour per pharmacy orders.  At 1000 am      Explained to patient.   Governor SpeckingKathryn Coralie Stanke, RN

## 2016-11-14 ENCOUNTER — Encounter (HOSPITAL_COMMUNITY): Payer: Self-pay | Admitting: Interventional Cardiology

## 2016-11-14 DIAGNOSIS — I2510 Atherosclerotic heart disease of native coronary artery without angina pectoris: Secondary | ICD-10-CM

## 2016-11-14 DIAGNOSIS — E785 Hyperlipidemia, unspecified: Secondary | ICD-10-CM

## 2016-11-14 DIAGNOSIS — I25110 Atherosclerotic heart disease of native coronary artery with unstable angina pectoris: Secondary | ICD-10-CM

## 2016-11-14 LAB — BASIC METABOLIC PANEL
Anion gap: 11 (ref 5–15)
BUN: 8 mg/dL (ref 6–20)
CHLORIDE: 105 mmol/L (ref 101–111)
CO2: 21 mmol/L — ABNORMAL LOW (ref 22–32)
Calcium: 8.7 mg/dL — ABNORMAL LOW (ref 8.9–10.3)
Creatinine, Ser: 0.75 mg/dL (ref 0.44–1.00)
GFR calc Af Amer: >60 mL/min (ref >60)
GFR calc non Af Amer: >60 mL/min (ref >60)
GLUCOSE: 168 mg/dL — AB (ref 65–99)
Potassium: 3.4 mmol/L — ABNORMAL LOW (ref 3.5–5.1)
SODIUM: 137 mmol/L (ref 135–145)

## 2016-11-14 LAB — GLUCOSE, CAPILLARY
GLUCOSE-CAPILLARY: 168 mg/dL — AB (ref 65–99)
Glucose-Capillary: 195 mg/dL — ABNORMAL HIGH (ref 65–99)

## 2016-11-14 LAB — CBC
HCT: 32 % — ABNORMAL LOW (ref 36.0–46.0)
HEMOGLOBIN: 11 g/dL — AB (ref 12.0–15.0)
MCH: 27.8 pg (ref 26.0–34.0)
MCHC: 34.4 g/dL (ref 30.0–36.0)
MCV: 81 fL (ref 78.0–100.0)
Platelets: 414 10*3/uL — ABNORMAL HIGH (ref 150–400)
RBC: 3.95 MIL/uL (ref 3.87–5.11)
RDW: 12.6 % (ref 11.5–15.5)
WBC: 9.6 10*3/uL (ref 4.0–10.5)

## 2016-11-14 LAB — HEMOGLOBIN A1C
Hgb A1c MFr Bld: 10.5 % — ABNORMAL HIGH (ref 4.8–5.6)
MEAN PLASMA GLUCOSE: 255 mg/dL

## 2016-11-14 MED ORDER — POTASSIUM CHLORIDE CRYS ER 20 MEQ PO TBCR
40.0000 meq | EXTENDED_RELEASE_TABLET | Freq: Once | ORAL | Status: AC
Start: 2016-11-14 — End: 2016-11-14
  Administered 2016-11-14: 10:00:00 40 meq via ORAL
  Filled 2016-11-14: qty 2

## 2016-11-14 MED ORDER — ATORVASTATIN CALCIUM 80 MG PO TABS: 80.0000 mg | ORAL_TABLET | Freq: Every day | ORAL | 11 refills | Status: DC

## 2016-11-14 MED ORDER — NITROGLYCERIN 0.4 MG SL SUBL: 0.4000 mg | SUBLINGUAL_TABLET | SUBLINGUAL | 3 refills | Status: AC | PRN

## 2016-11-14 MED ORDER — TICAGRELOR 90 MG PO TABS: 90.0000 mg | ORAL_TABLET | Freq: Two times a day (BID) | ORAL | 0 refills | Status: DC

## 2016-11-14 MED ORDER — ASPIRIN 81 MG PO CHEW: 81.0000 mg | CHEWABLE_TABLET | Freq: Every day | ORAL | 11 refills | Status: AC

## 2016-11-14 MED ORDER — LINAGLIPTIN 5 MG PO TABS: 5.0000 mg | ORAL_TABLET | Freq: Every day | ORAL | 5 refills | Status: DC

## 2016-11-14 MED ORDER — METOPROLOL SUCCINATE ER 25 MG PO TB24: 25.0000 mg | ORAL_TABLET | Freq: Every day | ORAL | 5 refills | Status: DC

## 2016-11-14 MED ORDER — METOPROLOL SUCCINATE ER 25 MG PO TB24
25.0000 mg | ORAL_TABLET | Freq: Every day | ORAL | Status: DC
  Administered 2016-11-14: 10:00:00 25 mg via ORAL
  Filled 2016-11-14: qty 1

## 2016-11-14 MED ORDER — TICAGRELOR 90 MG PO TABS: 90.0000 mg | ORAL_TABLET | Freq: Two times a day (BID) | ORAL | 10 refills | Status: DC

## 2016-11-14 NOTE — Discharge Summary (Signed)
Discharge Summary    Patient ID: Cynthia Kirby,  MRN: [id number], DOB/AGE: Dec 28, 1970 46 y.o.  Admit date: 11/12/2016 Discharge date: 11/14/2016  Primary Care Provider: Heron Nay Primary Cardiologist: New- Dr Mayford Knife  Discharge Diagnoses    Active Problems:   NSTEMI (non-ST elevated myocardial infarction) [facility])   Hypertensive urgency   Hyperlipidemia LDL goal <70   Coronary artery disease involving native coronary artery of native heart with unstable angina pectoris (HCC)   Allergies Allergies  Allergen Reactions  . Penicillins Anaphylaxis and Itching  . Ciprofloxacin Nausea And Vomiting and Other (See Comments)    Hallucination  . Zithromax [Azithromycin] Other (See Comments)    Hallucination    Diagnostic Studies/Procedures    Cardiac catheterization 11/13/2016  Mid RCA lesion, 50 %stenosed. Diffuse stenosis.  Mid Cx lesion, 40 %stenosed. Diffuse stenosis.  Ost 2nd Mrg to 2nd Mrg lesion, 40 %stenosed.  Ost 2nd Diag to 2nd Diag lesion, 70 %stenosed.  The left ventricular systolic function is normal.  LV end diastolic pressure is normal.  The left ventricular ejection fraction is 55-65% by visual estimate.  There is no aortic valve stenosis.  Mid LAD lesion, 95 %stenosed. A STENT SYNERGY DES [id number] drug eluting stent was successfully placed, postdilated to 3.6 mm.  Post intervention, there is a 0% residual stenosis.   Continue tirofiban for 2 hours post procedure.    I stressed the importance of dual antiplatelet therapy.  If she has trouble getting Brilinta after the one month free period, she can be switched to Plavix.  Would give 300 mg x 1 followed by 75 mg daily.  She needs aggressive secondary prevention as well.   _____________   History of Present Illness     Cynthia Kirby is a 46 y.o. female with past medical history of diabetes type 2, and hypertension who presented to the [facility] ED for evaluation of intermittent shortness of  breath, chest pain and calf swelling (bilateral) that was of sudden onset with she was getting stressed out. She reported anxiety which was relieved by Ativan in the ED. Her presenting complaints were different from prior panic attacks.   The patient is very busy and stressed. 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 She was taking a dya off yesterday and was sitting home watching TV. She did not take her losartan or amlodipine in the morning and she had eaten a pot pie that was high in sodium. While sitting she developed difficulty breathing and she couldn't talk speak. She had central chest pressure. This lasted for 15 minutes. 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 Her family came around to assist her. She then took her medications and took her BP which was 181/120. She did more breathing exercises and BP came down to 161/109. With all of her family trying to help her, the chest pain came back. This continued until she was seen at the [facility] med center and was given NTG that resolved all of her complaints. No chest pain or shortness of breath since, but her chest feels hollow.  She has had anxiety and panic attacks in the past, but this was different and much worse. Prior to this episode the patient was very active and had no exertional chest discomfort of shortness  of breath. She does not have any known family history of heart disease. She has been hypertensive since age 11, treated. She has type 2 diabetes on Metformin.   Hospital Course     Consultants: Diabetes care coordinator  Troponins were positive with max of 0.59 (none after cath) consisten with non-STEMI. Her EKG was nonischemic except for some T wave flattening. The patient was taken to the cath lab and found to have 50% stenosis of the Mid RCA, 40%  stenosis of the mid circumflex, 70% stenosis of the second diagonal, and 95% stenosis of the mid LAD for which a Synergy drug-eluting stent was placed. Left ventricular ejection fraction was estimated to be 55-65%.  The patient was also found to have a hemoglobin A1c of 10.5 and an LDL of 120. She will need aggressive secondary prevention. She has been started on dual antiplatelet therapy with Brilinta and aspirin. If Brilinta is too expensive for her after the first free 30 days she can be switched to Plavix starting with a 300 mg dose and followed with 75 mg daily. She has also been started on high intensity statin with Lipitor 80 mg daily. She has been seen by the diabetes coordinator who thinks that although the patient has had poor eating and exercise habits, she is now motivated to make changes. Will  arrange for follow-up with PCP to address her diabetes, case management consulted to find a PCP (the one that the patient wanted is no longer practicing).  Patient has been seen by Dr. Mayford Knife today and deemed ready for discharge home. All follow up appointments have been scheduled. Discharge medications are listed below. _____________  Discharge Vitals Blood pressure (!) 141/86, pulse 85, temperature 98.6 F (37 C), temperature source Oral, resp. rate 15, height 5\' 5"  (1.651 m), weight 216 lb 0.8 oz (98 kg), last menstrual period 10/18/2016, SpO2 100 %.  Filed Weights   11/12/16 2251 11/13/16 1218 11/14/16 0631  Weight: 220 lb (99.8 kg) 220 lb 12.8 oz (100.2 kg) 216 lb 0.8 oz (98 kg)    Labs & Radiologic Studies    CBC  Recent Labs  11/13/16 0627 11/14/16 0222  WBC 10.6* 9.6  NEUTROABS 5.3  --   HGB 11.5* 11.0*  HCT 34.0* 32.0*  MCV 81.3 81.0  PLT 438* 414*   Basic Metabolic Panel  Recent Labs  11/13/16 0627 11/14/16 0222  NA 136 137  K 3.7 3.4*  CL 102 105  CO2 23 21*  GLUCOSE 248* 168*  BUN 6 8  CREATININE 0.63 0.75  CALCIUM 8.8* 8.7*   Liver Function Tests  Recent  Labs  11/13/16 0627  AST 15  ALT 12*  ALKPHOS 58  BILITOT 0.4  PROT 6.5  ALBUMIN 3.5   No results for input(s): LIPASE, AMYLASE in the last 72 hours. Cardiac Enzymes  Recent Labs  11/12/16 2301 11/13/16 0153 11/13/16 0627  TROPONINI 0.05* 0.23* 0.59*   BNP Invalid input(s): POCBNP D-Dimer  Recent Labs  11/13/16 0627  DDIMER <0.27   Hemoglobin A1C  Recent Labs  11/13/16 0627  HGBA1C 10.5*   Fasting Lipid Panel  Recent Labs  11/13/16 0627  CHOL 208*  HDL 39*  LDLCALC 120*  TRIG 244*  CHOLHDL 5.3   Thyroid Function Tests No results for input(s): TSH, T4TOTAL, T3FREE, THYROIDAB in the last 72 hours.  Invalid input(s): FREET3 _____________  Dg Chest 2 View  Result Date: 11/12/2016 CLINICAL DATA:  Chest pain and shortness of breath EXAM: CHEST  2 VIEW COMPARISON:  Chest CT 03/27/2015 FINDINGS: The heart size and mediastinal contours are within normal limits. Both lungs are clear. The visualized skeletal structures are unremarkable. IMPRESSION: No active cardiopulmonary disease. Electronically Signed   By: Deatra RobinsonKevin  Herman M.D.   On: 11/12/2016 23:17   Disposition   Pt is being discharged home today in good condition.  Follow-up Plans & Appointments    Follow-up Information    Allayne ButcherSimmons, Brittainy M, PA-C Follow up on 11/23/2016.   Specialties:  Cardiology, Radiology Why:  at 9:30 for cardiology hospital follow up.  Contact information: [address] [place] [address] [phone]        Primary Care Physician Follow up.   Why:  Follow up with a PCP in the next week for diabetes control. This is very important.          Discharge Instructions    Amb Referral to Cardiac Rehabilitation    Complete by:  As directed    Referring to [place] CRP 2   Diagnosis:   NSTEMI Coronary Stents     Diet - low sodium heart healthy    Complete by:  As directed    Discharge instructions    Complete by:  As directed    PLEASE REMEMBER TO  BRING ALL OF YOUR MEDICATIONS TO EACH OF YOUR FOLLOW-UP OFFICE VISITS.  PLEASE ATTEND ALL SCHEDULED FOLLOW-UP APPOINTMENTS.   Activity: Increase activity slowly as tolerated. You may shower, but no soaking baths (or swimming) for 1 week. No driving for 24 hours. No lifting over 5 lbs for 1 week. No sexual activity for 1 week.   You May Return to Work: in 1 week (if applicable)  Wound Care: You may wash cath site gently with soap and water. Keep cath site clean and dry. If you notice pain, swelling, bleeding or pus at your cath site, please call [phone].   Increase activity slowly    Complete by:  As directed       Discharge Medications   Current Discharge Medication List    START taking these medications   Details  aspirin 81 MG chewable tablet Chew 1 tablet (81 mg total) by mouth daily. Qty: 30 tablet, Refills: 11    atorvastatin (LIPITOR) 80 MG tablet Take 1 tablet (80 mg total) by mouth daily at 6 PM. Qty: 30 tablet, Refills: 11    linagliptin (TRADJENTA) 5 MG TABS tablet Take 1 tablet (5 mg total) by mouth daily. Qty: 30 tablet, Refills: 5    metoprolol succinate (TOPROL-XL) 25 MG 24 hr tablet Take 1 tablet (25 mg total) by mouth daily. Qty: 30 tablet, Refills: 5    nitroGLYCERIN (NITROSTAT) 0.4 MG SL tablet Place 1 tablet (0.4 mg total) under the tongue every 5 (five) minutes x 3 doses as needed for chest pain. Qty: 25 tablet, Refills: 3    ticagrelor (BRILINTA) 90 MG TABS tablet Take 1 tablet (90 mg total) by mouth 2 (two) times daily. Qty: 60 tablet, Refills: 10      CONTINUE these medications which have NOT CHANGED   Details  amLODipine (NORVASC) 10 MG tablet Take 10 mg by mouth daily.    losartan (COZAAR) 100 MG tablet Take 100 mg by mouth daily.    metFORMIN (GLUCOPHAGE) 1000 MG tablet Take 1,000 mg by mouth daily.  Do not restart until 11/16/2016    Sodium Chloride-Sodium Bicarb (NETI POT SINUS WASH NA) Place 1 each into the nose as needed (for  sinus).        STOP taking these medications     fluticasone (FLONASE) 50 MCG/ACT nasal spray      saxagliptin HCl (ONGLYZA) 2.5 MG TABS tablet          Aspirin prescribed at discharge?  Yes High Intensity Statin Prescribed? (Lipitor 40-80mg  or Crestor 20-40mg ): Yes Beta Blocker Prescribed? Yes For EF <40%, was ACEI/ARB Prescribed? No: N/A ADP Receptor Inhibitor Prescribed? (i.e. Plavix etc.-Includes Medically Managed Patients): Yes For EF <40%, Aldosterone Inhibitor Prescribed? No: N/A Was EF assessed during THIS hospitalization? Yes Was Cardiac Rehab II ordered? (Included Medically managed Patients): Yes   Outstanding Labs/Studies   None  Duration of Discharge Encounter   Greater than 30 minutes including physician time.  Signed, Berton Bon NP 11/14/2016, 12:02 PM  Agree with discharge summary as outlined by Berton Bon, NP with some changes.  Signed: Armanda Magic, MD [facility] 11/14/2016

## 2016-11-14 NOTE — Progress Notes (Signed)
CARDIAC REHAB PHASE I   PRE:  Rate/Rhythm: 87SR  BP:  Supine: 143/90  Sitting:   Standing:    SaO2:   MODE:  Ambulation: 400 ft   POST:  Rate/Rhythm: 97 SR  BP:  Supine:   Sitting: 162/97  Standing:    SaO2:  0910-1012 Pt walked 400 ft with dizziness which she stated is normal for her. No CP. Pt stated she has learned to deal with dizziness. Denied taking meclizine or meds. MI education completed with pt who voiced understanding. Stressed importance of brilinta with stent. Has brilinta card. Reviewed risk factors, NTG use, ex ed, counting carbs and watching sodium, MI restrictions. Pt stated she is under a lot of stress with her jobs. Would highly recommend CRP 2 to assist as support group and to help with risk factor modification. Referring to [facility]. Pt aware of A1C of 10.5.   Cynthia Nuttingharlene Sosie Gato, RN BSN  11/14/2016 10:09 AM

## 2016-11-14 NOTE — Progress Notes (Addendum)
Progress Note  Patient Name: Cynthia Kirby Date of Encounter: 11/14/2016  Primary Cardiologist: Dr. Mayford Knife  Subjective   Denies any chest pain or pressure, no SOB  Inpatient Medications    Scheduled Meds: . amLODipine  10 mg Oral Daily  . aspirin  81 mg Oral Daily  . atorvastatin  80 mg Oral q1800  . fluticasone  1 spray Each Nare Daily  . insulin aspart  0-15 Units Subcutaneous TID WC  . insulin aspart  0-5 Units Subcutaneous QHS  . linagliptin  5 mg Oral Daily  . losartan  100 mg Oral Daily  . metoprolol tartrate  25 mg Oral BID  . sodium chloride flush  3 mL Intravenous Q12H  . ticagrelor  90 mg Oral BID   Continuous Infusions: . sodium chloride     PRN Meds: sodium chloride, acetaminophen, acetaminophen, nitroGLYCERIN, ondansetron (ZOFRAN) IV, sodium chloride flush   Vital Signs    Vitals:   11/13/16 1900 11/13/16 2000 11/13/16 2100 11/14/16 0631  BP: 118/67 116/61 136/77 (!) 149/79  Pulse:  95  86  Resp: (!) 23 20 19 16   Temp:  98.2 F (36.8 C)  98 F (36.7 C)  TempSrc:  Oral  Oral  SpO2:  99% 99% 100%  Weight:    216 lb 0.8 oz (98 kg)  Height:        Intake/Output Summary (Last 24 hours) at 11/14/16 0808 Last data filed at 11/13/16 2200  Gross per 24 hour  Intake              860 ml  Output             1500 ml  Net             -640 ml   Filed Weights   11/12/16 2251 11/13/16 1218 11/14/16 0631  Weight: 220 lb (99.8 kg) 220 lb 12.8 oz (100.2 kg) 216 lb 0.8 oz (98 kg)    Telemetry    NSR - Personally Reviewed  ECG    NSR - Personally Reviewed  Physical Exam   GEN: No acute distress.   Neck: No JVD Cardiac: RRR, no murmurs, rubs, or gallops.  Respiratory: Clear to auscultation bilaterally. GI: Soft, nontender, non-distended  MS: No edema; No deformity.  Right radial cath site with no hematoma and 2+ radial pulse.  Mild swelling in hand but able to move fingers with no difficulty Neuro:  Nonfocal  Psych: Normal affect   Labs      Chemistry Recent Labs Lab 11/12/16 2301 11/13/16 0627 11/14/16 0222  NA 134* 136 137  K 3.5 3.7 3.4*  CL 100* 102 105  CO2 25 23 21*  GLUCOSE 422* 248* 168*  BUN 11 6 8   CREATININE 0.72 0.63 0.75  CALCIUM 9.4 8.8* 8.7*  PROT  --  6.5  --   ALBUMIN  --  3.5  --   AST  --  15  --   ALT  --  12*  --   ALKPHOS  --  58  --   BILITOT  --  0.4  --   GFRNONAA >60 >60 >60  GFRAA >60 >60 >60  ANIONGAP 9 11 11      Hematology Recent Labs Lab 11/12/16 2301 11/13/16 0627 11/14/16 0222  WBC 8.9 10.6* 9.6  RBC 4.30 4.18 3.95  HGB 12.3 11.5* 11.0*  HCT 34.5* 34.0* 32.0*  MCV 80.2 81.3 81.0  MCH 28.6 27.5 27.8  MCHC 35.7 33.8 34.4  RDW 12.3 12.6 12.6  PLT 441* 438* 414*    Cardiac Enzymes Recent Labs Lab 11/12/16 2301 11/13/16 0153 11/13/16 0627  TROPONINI 0.05* 0.23* 0.59*   No results for input(s): TROPIPOC in the last 168 hours.   BNP Recent Labs Lab 11/13/16 0627  BNP 67.6     DDimer  Recent Labs Lab 11/13/16 0627  DDIMER <0.27     Radiology    Dg Chest 2 View  Result Date: 11/12/2016 CLINICAL DATA:  Chest pain and shortness of breath EXAM: CHEST  2 VIEW COMPARISON:  Chest CT 03/27/2015 FINDINGS: The heart size and mediastinal contours are within normal limits. Both lungs are clear. The visualized skeletal structures are unremarkable. IMPRESSION: No active cardiopulmonary disease. Electronically Signed   By: Deatra RobinsonKevin  Herman M.D.   On: 11/12/2016 23:17    Cardiac Studies   Cath 11/13/2016 Conclusion     Mid RCA lesion, 50 %stenosed. Diffuse stenosis.  Mid Cx lesion, 40 %stenosed. Diffuse stenosis.  Ost 2nd Mrg to 2nd Mrg lesion, 40 %stenosed.  Ost 2nd Diag to 2nd Diag lesion, 70 %stenosed.  The left ventricular systolic function is normal.  LV end diastolic pressure is normal.  The left ventricular ejection fraction is 55-65% by visual estimate.  There is no aortic valve stenosis.  Mid LAD lesion, 95 %stenosed. A STENT SYNERGY DES 3X32  drug eluting stent was successfully placed, postdilated to 3.6 mm.  Post intervention, there is a 0% residual stenosis.       Patient Profile     46 y.o. female with multiple CRFs including HTN, type 2 DM and obesity and very remote history of social tobacco use who presented to [place] [facility] center with complaints of severe CP and SOB with nausea.  She has a history of panic attacks but says that these symptoms are unlike what she has had before.  She had not taken her BP meds yesterday and also ate a high sodium pot pie and BP was markedly elevated on exam in ER.  Her CP was significantly improved with SL NTG.  Her EKG is nonischemic except for some T wave flattening.  Assessment & Plan     NSTEMI -Admitted with chest pain and shortness of breath relieved by NTG, sudden onset with no prior exertional symptoms -Troponins mildly positive and rising. 0.05-->0.23-->0.59 -Cath with mid LAD 95% s/p PCI with DES and residual CAD with 50% mid RCA, 40% mid LCx, 40% OM2, 70% D2 and normal LVF EF 55-65%.   -continue Brilinta and ASA. -change lopressor 25mg  BID to Toprol XL 25mg  daily for better compliance -continue high dose Lipitor 80mg  daily.  Hypertension -HTN since age 46 -BP at home during symptoms was 181/120 -BP improved back on home meds: amlodipine 10 mg and losartan 100 mg.  -Changing metoprolol tartrate to Toprol XL 25mg  daily.    Diabetes -Restart Metformin 5/23 am -HbA1C 10.5 - needs followup with PCP -I will have diabetic coordinator see patient prior to discharge to help with management of meds -We will get her in to see Dr. Willa RoughHicks in [place] this week for followup  Hyperlipidemia -Lipid panel 11/13/16: TC 208, LDL 120, Trig 244 -Statin initiated with Lipitor 80mg  daily. - she will need an FLP and ALT in 6 weeks.  Hypokalemia - replete  Signed, Armanda Magicraci Shenique Childers, MD  11/14/2016, 8:08 AM

## 2016-11-14 NOTE — Progress Notes (Signed)
Inpatient Diabetes Program Recommendations  AACE/ADA: New Consensus Statement on Inpatient Glycemic Control (2015)  Target Ranges:  Prepandial:   less than 140 mg/dL      Peak postprandial:   less than 180 mg/dL (1-2 hours)      Critically ill patients:  140 - 180 mg/dL   Lab Results  Component Value Date   GLUCAP 168 (H) 11/14/2016   HGBA1C 10.5 (H) 11/13/2016    Review of Glycemic Control Inpatient Diabetes Program Recommendations:   Spoke with pt and mom @ bedside about A1C results 10.5 (average BG 255 over the past 2-3 months time) and explained what an A1C is, basic pathophysiology of DM Type 2, basic home care, basic diabetes diet nutrition principles, importance of checking CBGs and maintaining good CBG control to prevent long-term and short-term complications. Reviewed signs and symptoms of hyperglycemia and hypoglycemia and how to treat hypoglycemia at home. Also reviewed blood sugar goals at home.  Patient stated willingness to make significant adjustments in nutrition management of DM. Shared she has been drinking a lot of sugary drinks and eating unhealthy as lots of potatoes.  Patient was not taking her Metformin regularly either so verbalized she plans to take her medications as prescribed. Patient has a meter and strips @ home and reviewed to take log of CBGs or meter with her to MD appts to review for medications mgt. Patient and mom verbalized appreciation for information shared.  Thank you, Cynthia FischerJudy E. Kalliope Riesen, RN, MSN, CDE  Diabetes Coordinator Inpatient Glycemic Control Team Team Pager [phone] (8am-5pm) 11/14/2016 11:33 AM

## 2016-11-14 NOTE — Progress Notes (Signed)
  #   1.  S/W Cynthia Kirby @ [facility] # [phone]    BRILINTA 90 MG BID  COVER- YES  CO-PAY- $ 12.56  PRIOR APPROVAL- NO   PHARMACY: [facility], [facility]  [facility]

## 2016-11-14 NOTE — Discharge Instructions (Signed)
Heart Attack °A heart attack (myocardial infarction, MI) causes damage to your heart that cannot be fixed. A heart attack can happen when a heart (coronary) artery becomes blocked or narrowed. This cuts off the blood supply and oxygen to your heart. °When one or more of your coronary arteries becomes blocked, that area of your heart begins to die. This causes the pain you feel during a heart attack. Heart attack pain can also occur in one part of the body but be felt in another part of the body (referred pain). You may feel referred heart attack pain in your left arm, neck, or jaw. Pain may even be felt in the right arm. °What are the causes? °Many conditions can cause a heart attack. These include: °· Atherosclerosis. This is when a fatty substance (plaque) gradually builds up in the arteries. This buildup can block or reduce the blood supply to one or more of the heart arteries. °· A blood clot. A blood clot can develop suddenly when plaque breaks up (ruptures) within a heart artery. A blood clot can block the heart artery, which prevents blood flow to the heart. °· Severe tightening (spasm) of the heart artery. This cuts off blood flow through the artery. °What increases the risk? °  °People at risk for heart attack usually have one or more of the following risk factors: °· High blood pressure (hypertension). °· High cholesterol. °· Smoking. °· Being female. °· Being overweight or obese. °· Older aged. °· A family history of heart disease. °· Lack of exercise. °· Diabetes. °· Stress. °· Drinking too much alcohol. °· Using illegal street drugs, such as cocaine and methamphetamines. °What are the signs or symptoms? °Heart attack symptoms can vary from person to person. Symptoms depend on factors like gender and age. °· In both men and women, heart attack symptoms can include the following: °¨ Chest pain. This may feel like crushing, squeezing, or a feeling of pressure. °¨ Shortness of breath. °¨ Heartburn or  indigestion with or without vomiting, shortness of breath, or sweating. °¨ Sudden cold sweats. °¨ Sudden light-headedness. °¨ Upper back pain. °· Women can have unique heart attack symptoms, such as: °¨ Unexplained feelings of nervousness or anxiety. °¨ Discomfort between the shoulder blades or upper back. °¨ Tingling in the hands and arms. °· Older people (of both genders) can have subtle heart attack symptoms, such as: °¨ Sweating. °¨ Shortness of breath. °¨ General tiredness or not feeling well. °How is this diagnosed? °Diagnosing a heart attack involves several tests. They include: °· An assessment of your vital signs. This includes checking your: °¨ Heart rhythm. °¨ Blood pressure. °¨ Breathing rate. °¨ Oxygen level. °· An ECG (electrocardiogram) to measure the electrical activity of your heart. °· Blood tests called cardiac markers. In these tests, blood is drawn at scheduled times to check for the specific proteins or enzymes released by damaged heart muscle. °· A chest X-ray. °· An echocardiogram to evaluate heart motion and blood flow. °· Coronary angiography to look at the heart arteries. °How is this treated? °Treatment for a heart attack may include: °· Medicine that breaks up or dissolves blood clots in the heart artery. °· Angioplasty. °· Cardiac stent placement. °· Intra-aortic balloon pump therapy (IABP). °· Open heart surgery (coronary artery bypass graft, CABG). °Follow these instructions at home: °· Take medicines only as directed by your health care provider. You may need to take medicine after a heart attack to: °¨ Keep your blood from clotting   too easily. °¨ Control your blood pressure. °¨ Lower your cholesterol. °¨ Control abnormal heart rhythms. °· Do not take the following medicines unless your health care provider approves: °¨ Nonsteroidal anti-inflammatory drugs (NSAIDs), such as ibuprofen, naproxen, or celecoxib. °¨ Vitamin supplements that contain vitamin A, vitamin E, or  both. °¨ Hormone replacement therapy that contains estrogen with or without progestin. °· Make lifestyle changes as directed by your health care provider. These may include: °¨ Quitting smoking, if you smoke. °¨ Getting regular exercise. Ask your health care provider to suggest some activities that are safe for you. °¨ Eating a heart-healthy diet. A registered dietitian can help you learn healthy eating options. °¨ Maintaining a healthy weight. °¨ Managing diabetes, if necessary. °¨ Reducing stress. °¨ Limiting how much alcohol you drink. °Get help right away if: °· You have sudden, unexplained chest discomfort. °· You have sudden, unexplained discomfort in your arms, back, neck, or jaw. °· You have shortness of breath at any time. °· You suddenly start to sweat or your skin gets clammy. °· You feel nauseous or vomit. °· You suddenly feel light-headed or dizzy. °· Your heart begins to beat fast or feels like it is skipping beats. °These symptoms may represent a serious problem that is an emergency. Do not wait to see if the symptoms will go away. Get medical help right away. Call your local emergency services (911 in the [place] Do not drive yourself to the hospital.  °This information is not intended to replace advice given to you by your health care provider. Make sure you discuss any questions you have with your health care provider. °Document Released: 06/12/2005 Document Revised: 11/18/2015 Document Reviewed: 08/15/2013 °Elsevier Interactive Patient Education © 2017 [employer] ° °

## 2016-11-15 ENCOUNTER — Telehealth: Payer: Self-pay | Admitting: Cardiology

## 2016-11-15 ENCOUNTER — Telehealth: Payer: Self-pay

## 2016-11-15 DIAGNOSIS — R9431 Abnormal electrocardiogram [ECG] [EKG]: Secondary | ICD-10-CM

## 2016-11-15 NOTE — Telephone Encounter (Signed)
-----   Message from [name], MD sent at 11/15/2016  1:47 PM EDT ----- Please set  Patient up for outpt echo this week for CAD and abnormal EKG  Cynthia Kirby

## 2016-11-15 NOTE — Telephone Encounter (Signed)
New Message     linagliptin (TRADJENTA) 5 MG TABS tablet Take 1 tablet (5 mg total) by mouth daily.    patient can not afford this medication and it is not covered by insurance

## 2016-11-15 NOTE — Telephone Encounter (Signed)
New Message      Pt c/o medication issue:  1. Name of Medication:   linagliptin (TRADJENTA) 5 MG TABS tablet Take 1 tablet (5 mg total) by mouth daily.     4. What is your medication issue?  Can not afford this medication needs to having something else called in

## 2016-11-15 NOTE — Telephone Encounter (Signed)
Informed patient that Cardiology does not have samples of Tradjenta, so she could either 1) call PCP and see if they could give her samples until her appointment 6/4 OR 2) call for earlier appointment to discuss DM treatment. She did not feel comfortable calling PCP to request samples or to reschedule since she has never seen him before.  Called PCP office. Rescheduled patient's appointment to TODAY at 1530.   Confirmed with patient she will go to PCP appointment today. Confirmed appointment with [facility] 6/[facility]. Patient was grateful for call.

## 2016-11-15 NOTE — Telephone Encounter (Signed)
ECHO ordered for ASAP scheduling.

## 2016-11-15 NOTE — Telephone Encounter (Signed)
Attempted to call patient. Received automated message that VM is full - unable to leave message.  Will try again later.

## 2016-11-16 NOTE — Telephone Encounter (Signed)
Follow up ° ° ° °Pt is returning call to [place]. °

## 2016-11-16 NOTE — Telephone Encounter (Signed)
Spoke with ECHO scheduler. Soonest available ECHO is 6/4. Scheduled patient ECHO 6/4 at 1500. Follow-up appointment is 6/5.  Attempted to call patient to confirm appointment, but VM is full - unable to leave message.  Will try again later.

## 2016-11-16 NOTE — Telephone Encounter (Signed)
Left message to call back  

## 2016-11-21 NOTE — Telephone Encounter (Signed)
Confirmed ECHO appointment with patient 6/4. She agrees with treatment plan.

## 2016-11-21 NOTE — Telephone Encounter (Signed)
Left message to call back  

## 2016-11-21 NOTE — Telephone Encounter (Signed)
Follow up ° ° °Pt is calling back for Rn. °

## 2016-11-23 ENCOUNTER — Ambulatory Visit: Payer: BLUE CROSS/BLUE SHIELD | Admitting: Cardiology

## 2016-11-27 ENCOUNTER — Other Ambulatory Visit: Payer: Self-pay

## 2016-11-27 ENCOUNTER — Ambulatory Visit (HOSPITAL_COMMUNITY): Payer: BLUE CROSS/BLUE SHIELD | Attending: Cardiovascular Disease

## 2016-11-27 DIAGNOSIS — R9431 Abnormal electrocardiogram [ECG] [EKG]: Secondary | ICD-10-CM | POA: Diagnosis not present

## 2016-11-27 DIAGNOSIS — I252 Old myocardial infarction: Secondary | ICD-10-CM | POA: Insufficient documentation

## 2016-11-27 DIAGNOSIS — I10 Essential (primary) hypertension: Secondary | ICD-10-CM | POA: Insufficient documentation

## 2016-11-27 DIAGNOSIS — I2510 Atherosclerotic heart disease of native coronary artery without angina pectoris: Secondary | ICD-10-CM | POA: Diagnosis not present

## 2016-11-27 DIAGNOSIS — E119 Type 2 diabetes mellitus without complications: Secondary | ICD-10-CM | POA: Diagnosis not present

## 2016-11-28 ENCOUNTER — Ambulatory Visit: Payer: BLUE CROSS/BLUE SHIELD | Admitting: Physician Assistant

## 2016-11-29 ENCOUNTER — Encounter: Payer: Self-pay | Admitting: Physician Assistant

## 2016-11-29 ENCOUNTER — Encounter (INDEPENDENT_AMBULATORY_CARE_PROVIDER_SITE_OTHER): Payer: Self-pay

## 2016-11-29 ENCOUNTER — Ambulatory Visit (INDEPENDENT_AMBULATORY_CARE_PROVIDER_SITE_OTHER): Payer: BLUE CROSS/BLUE SHIELD | Admitting: Physician Assistant

## 2016-11-29 VITALS — BP 150/86 | HR 78 | Ht 65.0 in | Wt 220.0 lb

## 2016-11-29 DIAGNOSIS — I2510 Atherosclerotic heart disease of native coronary artery without angina pectoris: Secondary | ICD-10-CM

## 2016-11-29 DIAGNOSIS — R0602 Shortness of breath: Secondary | ICD-10-CM

## 2016-11-29 DIAGNOSIS — E876 Hypokalemia: Secondary | ICD-10-CM

## 2016-11-29 DIAGNOSIS — E785 Hyperlipidemia, unspecified: Secondary | ICD-10-CM | POA: Diagnosis not present

## 2016-11-29 DIAGNOSIS — E049 Nontoxic goiter, unspecified: Secondary | ICD-10-CM

## 2016-11-29 DIAGNOSIS — I10 Essential (primary) hypertension: Secondary | ICD-10-CM

## 2016-11-29 MED ORDER — ROSUVASTATIN CALCIUM 10 MG PO TABS: 10.0000 mg | ORAL_TABLET | Freq: Every day | ORAL | 3 refills | Status: DC

## 2016-11-29 MED ORDER — SPIRONOLACTONE 25 MG PO TABS: 25.0000 mg | ORAL_TABLET | Freq: Every day | ORAL | 0 refills | Status: DC

## 2016-11-29 NOTE — Patient Instructions (Signed)
Medication Instructions:  Your physician has recommended you make the following change in your medication:  1. Start Aldactone (25 mg ) daily, sent in to patient's requested pharmacy.   Labwork: Your physician recommends that you have lab work today: bmet/cbc/tsh/bnp   Testing/Procedures: -None  Follow-Up: Your physician recommends that you keep your scheduled  follow-up appointment with Ronie Spiesayna Dunn, PA.   Any Other Special Instructions Will Be Listed Below (If Applicable).     If you need a refill on your cardiac medications before your next appointment, please call your pharmacy.

## 2016-11-29 NOTE — Progress Notes (Signed)
Cardiology Office Note    Date:  11/29/2016  ID:  Cynthia Kirby, DOB 05/14/1971, MRN [id number] PCP:  Heron NayYoung, Lauren E, PA  Cardiologist: Dr. Mayford Knifeurner   Chief Complaint: f/u MI   History of Present Illness:  Cynthia Kirby is a 46 y.o. female with history of DM, HTN, anxiety, panic attacks, hyperlipidemia, and recently diagnosed CAD with NSTEMI s/p DES to mLAD who presents for follow-up. She was recently admitted for significant chest pressure and dyspnea in the setting of high blood pressure. Troponin was positive at 0.59 c/w NSTEMI. LHC 11/13/16 showed 95% mLAD s/p DES, otherwise residual 50% mRCa, 40% mCx, 40% OM2, 70% D2, EF 55-65%. She was started on Apirin and Brilinta. A1C was 10.5 and LDL 120 prompting recommendation for aggressive medical therapy. Last labs showed hypokalemia with K of 3.4, Cr 0.75, Hgb 11.0, LFTs OK. OP 2D echo 10/27/16: moderate LVH, EF 60-65%, grade 1 DD. [facility] - per Epic, no objective cause was found, negative cardiac enzymes, CXR clear. It was felt this was due to Brilinta and so she was changed to Effient. Her Lipitor was changed to Crestor.  She presents back to clinic today. She reports her dyspnea has not changed much since changing to Effient. She states this has been present ever since she got out of her cath. She feels mildly short of breath "all the time" - she is a Scientist, research (life sciences)vocal coach and notices she needs to focus on her breathing sometimes. She also reports feeling hypervigilant now about all chest symptoms. She continues to have some anxiety regarding recent events. BP remains elevated. No further chest pain/pressure. No dizziness or syncope. She does report generalized fatigue. Of note she reports remote thyroid problems but stopped [place]throid many years ago in [place] because she felt it was making her hair fall out. She is not hypoxic, tachycardic or tachypneic. No LEE. She does have occasional orthopnea.   Past Medical History:  Diagnosis  Date  . Anemia   . Anxiety   . Arthritis    "left ankle; knees" (11/13/2016)  . CAD in native artery 11/12/2016   A. NSTEMI with LHC 11/13/16 RCA 50%, Circ 40%, 2nd OM 40%, 2nd diag 70%, Mid LAD 95%- DES placed. EF normal.  . Depression    "hx; not now" (11/13/2016)  . GERD (gastroesophageal reflux disease)   . Headache    "weekly" (11/13/2016)  . Hyperlipidemia   . Hypertension   . Hypothyroidism   . Migraine    "weekly" (11/13/2016)  . Sleep apnea    "CPAP RX'd; never used" (11/13/2016)  . Type II diabetes mellitus (HCC)     Past Surgical History:  Procedure Laterality Date  . CORONARY ANGIOPLASTY WITH STENT PLACEMENT  11/13/2016  . CORONARY STENT INTERVENTION N/A 11/13/2016   Procedure: Coronary Stent Intervention;  Surgeon: Corky CraftsVaranasi, Jayadeep S, MD;  [facility];  Service: Cardiovascular;  Laterality: N/A;  . LEFT HEART CATH AND CORONARY ANGIOGRAPHY N/A 11/13/2016   Procedure: Left Heart Cath and Coronary Angiography;  Surgeon: Corky CraftsVaranasi, Jayadeep S, MD;  [facility];  Service: Cardiovascular;  Laterality: N/A;  . TONSILLECTOMY AND ADENOIDECTOMY  2011    Current Medications: Current Outpatient Prescriptions  Medication Sig Dispense Refill  . amLODipine (NORVASC) 10 MG tablet Take 10 mg by mouth daily.    Marland Kitchen. aspirin 81 MG chewable tablet Chew 1 tablet (81 mg total) by mouth daily. 30 tablet 11  . linagliptin (  TRADJENTA) 5 MG TABS tablet Take 1 tablet (5 mg total) by mouth daily. 30 tablet 5  . losartan (COZAAR) 100 MG tablet Take 100 mg by mouth daily.    . metFORMIN (GLUCOPHAGE) 1000 MG tablet Take 1,000 mg by mouth daily.     . metoprolol succinate (TOPROL-XL) 25 MG 24 hr tablet Take 1 tablet (25 mg total) by mouth daily. 30 tablet 5  . nitroGLYCERIN (NITROSTAT) 0.4 MG SL tablet Place 1 tablet (0.4 mg total) under the tongue every 5 (five) minutes x 3 doses as needed for chest pain. 25 tablet 3  . pantoprazole (PROTONIX) 40 MG tablet Take 40 mg  by mouth.    . prasugrel (EFFIENT) 10 MG TABS tablet Take 10 mg by mouth daily    . Sodium Chloride-Sodium Bicarb (NETI POT SINUS WASH NA) Place 1 each into the nose as needed (for sinus).    . rosuvastatin (CRESTOR) 10 MG tablet Take 1 tablet (10 mg total) by mouth daily. 90 tablet 3   No current facility-administered medications for this visit.      Allergies:   Penicillins; Dapagliflozin; and Lisinopril   Social History   Social History  . Marital status: Single    Spouse name: N/A  . Number of children: N/A  . Years of education: N/A   Social History Main Topics  . Smoking status: Never Smoker  . Smokeless tobacco: Never Used  . Alcohol use Yes     Comment: 11/13/2016 "might have a couple drinks/year"  . Drug use: No  . Sexual activity: Yes    Birth control/ protection: None   Other Topics Concern  . None   Social History Narrative  . None     Family History:  Family History  Problem Relation Age of Onset  . Hypertension Mother   . Diabetes Mother   . Hypertension Father   . Diabetes Father        borderline  . Healthy Sister   . Healthy Brother      ROS:   Please see the history of present illness.   All other systems are reviewed and otherwise negative.    PHYSICAL EXAM:   VS:  BP (!) 150/86   Pulse 78   Ht 5\' 5"  (1.651 m)   Wt 220 lb (99.8 kg)   SpO2 99%   BMI 36.61 kg/m   BMI: Body mass index is 36.61 kg/m. GEN: Well nourished, well developed AAF in no acute distress, very lively, laughing HEENT: normocephalic, atraumatic Neck: no JVD, carotid bruits. Generalized bilateral thyroid gland fullness noted without overt hard nodule Cardiac: RRR; no murmurs, rubs, or gallops, no edema  Respiratory:  clear to auscultation bilaterally, normal work of breathing GI: soft, nontender, nondistended, + BS MS: no deformity or atrophy  Skin: warm and dry, no rash. Right radial cath site without hematoma or ecchymosis; good pulse. Neuro:  Alert and Oriented x  3, Strength and sensation are intact, follows commands Psych: euthymic mood, full affect  Wt Readings from Last 3 Encounters:  11/29/16 220 lb (99.8 kg)  11/14/16 216 lb 0.8 oz (98 kg)  07/18/16 220 lb (99.8 kg)      Studies/Labs Reviewed:   EKG:  EKG was ordered today and personally reviewed by me and demonstrates NSR 78bpm, nonspecific TW changes with biphasic TW V2, appears improved from prior tracing  Recent Labs: 11/13/2016: ALT 12; B Natriuretic Peptide 67.6 11/14/2016: BUN 8; Creatinine, Ser 0.75; Hemoglobin 11.0; Platelets 414; Potassium  3.4; Sodium 137   Lipid Panel    Component Value Date/Time   CHOL 208 (H) 11/13/2016 0627   TRIG 244 (H) 11/13/2016 0627   HDL 39 (L) 11/13/2016 0627   CHOLHDL 5.3 11/13/2016 0627   VLDL 49 (H) 11/13/2016 0627   LDLCALC 120 (H) 11/13/2016 5784    Additional studies/ records that were reviewed today include: Summarized above.    ASSESSMENT & PLAN:   1. CAD in native artery - continue DAPT, beta blocker, and statin. EKG appears improved from prior. See below regarding residual dyspnea. 2. Shortness of breath - etiology of mild SOB not totally clear, question whether this remains due to poorly controlled BP in the context of diastolic dysfunction and LVH. She's not reporting any recurrent chest pressure. EKG appears to be improving and interim outpatient echo showed normal LV function. Will repeat labs today to trend hemoglobin, creatinine and BNP. She is not tachycardic, tachypneic or hypoxic and has no signs of volume overload on exam. Add spironolactone 25mg  daily for BP control. ER precautions reviewed with patient. 3. HTN - present since age 64 and seems to be difficult to control despite many medications. Will add spironolactone in case there is a component of high aldosterone state. If BP remains elevated despite med titration would consider sleep study to exclude OSA given obesity, UA/eval for nephrotic state versus renal artery  duplex. Also discussed importance of low sodium diet (2g) and 2L fluid restriction since BP is on higher side with diastolic dysfunction. 4. Hyperlipidemia - statin recently changed at [facility] for unclear reason. Will revisit at next OV. If tolerating statin at that time, would consider further titrating with recheck of LFTs/lipids in 4-6 weeks. 5. Hypokalemia - recheck today. 6. Abnormal thyroid fullness - with h/o thyroid disease. Check TSH and fT4.  Disposition: F/u with me/care team APP/Dr.Turner in 1 week.   Medication Adjustments/Labs and Tests Ordered: Current medicines are reviewed at length with the patient today.  Concerns regarding medicines are outlined above. Medication changes, Labs and Tests ordered today are summarized above and listed in the Patient Instructions accessible in Encounters.   Signed, Laurann Montana, PA-C  11/29/2016 2:44 PM    [facility] [address] [place], [place], [place]  [address] Phone: [phone]; Fax: [phone]

## 2016-11-30 ENCOUNTER — Telehealth: Payer: Self-pay | Admitting: Physician Assistant

## 2016-11-30 LAB — BASIC METABOLIC PANEL
BUN / CREAT RATIO: 11 (ref 9–23)
BUN: 11 mg/dL (ref 6–24)
CHLORIDE: 102 mmol/L (ref 96–106)
CO2: 23 mmol/L (ref 18–29)
CREATININE: 1.03 mg/dL — AB (ref 0.57–1.00)
Calcium: 9.4 mg/dL (ref 8.7–10.2)
GFR calc Af Amer: 77 mL/min/{1.73_m2} (ref >59)
GFR calc non Af Amer: 67 mL/min/{1.73_m2} (ref >59)
GLUCOSE: 145 mg/dL — AB (ref 65–99)
Potassium: 4.1 mmol/L (ref 3.5–5.2)
SODIUM: 142 mmol/L (ref 134–144)

## 2016-11-30 LAB — TSH: TSH: 2.13 u[IU]/mL (ref 0.450–4.500)

## 2016-11-30 LAB — PRO B NATRIURETIC PEPTIDE: NT-Pro BNP: 134 pg/mL — ABNORMAL HIGH (ref 0–130)

## 2016-11-30 LAB — CBC WITH DIFFERENTIAL/PLATELET

## 2016-11-30 NOTE — Telephone Encounter (Signed)
Spoke with pt and went over results. Pt verbalized understanding. Spoke with Katrina in lab and they have already called the pt to come back to get CBC drawn. Pt verbalized understanding and plans to come back to our office for CBC.

## 2016-11-30 NOTE — Telephone Encounter (Signed)
Follow up     Patient returning call back to triage nurse - lab results

## 2016-12-01 NOTE — Addendum Note (Signed)
Addended by: Hilaria OtaBOWDEN, Xylah Early on: 12/01/2016 05:08 PM   Modules accepted: Orders

## 2016-12-01 NOTE — Addendum Note (Signed)
Addended by: BOWDEN, ROBIN on: 12/01/2016 05:08 PM   Modules accepted: Orders  

## 2016-12-02 LAB — CBC WITH DIFFERENTIAL/PLATELET
BASOS: 1 %
Basophils Absolute: 0.1 10*3/uL (ref 0.0–0.2)
EOS (ABSOLUTE): 0.2 10*3/uL (ref 0.0–0.4)
Eos: 3 %
Hematocrit: 32.8 % — ABNORMAL LOW (ref 34.0–46.6)
Hemoglobin: 11 g/dL — ABNORMAL LOW (ref 11.1–15.9)
IMMATURE GRANS (ABS): 0 10*3/uL (ref 0.0–0.1)
Immature Granulocytes: 0 %
LYMPHS: 40 %
Lymphocytes Absolute: 3.4 10*3/uL — ABNORMAL HIGH (ref 0.7–3.1)
MCH: 27.4 pg (ref 26.6–33.0)
MCHC: 33.5 g/dL (ref 31.5–35.7)
MCV: 82 fL (ref 79–97)
Monocytes Absolute: 0.4 10*3/uL (ref 0.1–0.9)
Monocytes: 5 %
NEUTROS ABS: 4.5 10*3/uL (ref 1.4–7.0)
Neutrophils: 51 %
PLATELETS: 462 10*3/uL — AB (ref 150–379)
RBC: 4.02 x10E6/uL (ref 3.77–5.28)
RDW: 14.2 % (ref 12.3–15.4)
WBC: 8.5 10*3/uL (ref 3.4–10.8)

## 2016-12-04 ENCOUNTER — Telehealth: Payer: Self-pay | Admitting: Physician Assistant

## 2016-12-04 NOTE — Telephone Encounter (Signed)
I am back in the office 12/06/16 and am happy to provide the letter at that time. Can you let her know? Dayna Dunn PA-C

## 2016-12-04 NOTE — Telephone Encounter (Signed)
PT  HAS  APPOINTMENT ON WED  12-06-16 WOULD  LIKE  TO PARTICIPATE  IN CARDIAC REHAB, ALSO  IS   HAVING  SOB  AND  WAS  RECENTLY  SWITCHED  FROM  BRILINTA  TO EFFIENT  AND  EFFIENT  CAUSES SOB  AS WELL  AND  ALSO  NEEDS  A LETTER   TO [facility]  [facility]  STATING  THAT  DUE  TO  HEALTH  IS  UNABLE TO USE  FACILITY AT  THIS  TIME  SO PT   ISN'T  CHARGED TO  THE MONTHLY  FEE .WILL FORWARD TO DAYNA  DUNN PA TO REVIEW .Cynthia Kirby/CY

## 2016-12-04 NOTE — Telephone Encounter (Signed)
Patient calling, states that the possibility of having monitored workouts was discussed after having a heart attack. Patient would like to know how to set them up and also patient states that she would like a letter so that she can place her [employer] membership on  Hold. Patient is scheduled to come in on 12-06-16 and was unsure if she would get letter from our office or from her PCP? Please call to discuss, thanks.

## 2016-12-06 ENCOUNTER — Ambulatory Visit (INDEPENDENT_AMBULATORY_CARE_PROVIDER_SITE_OTHER): Payer: BLUE CROSS/BLUE SHIELD | Admitting: Physician Assistant

## 2016-12-06 ENCOUNTER — Encounter: Payer: Self-pay | Admitting: Physician Assistant

## 2016-12-06 VITALS — BP 120/80 | HR 81 | Ht 65.0 in | Wt 224.0 lb

## 2016-12-06 DIAGNOSIS — E785 Hyperlipidemia, unspecified: Secondary | ICD-10-CM

## 2016-12-06 DIAGNOSIS — I2510 Atherosclerotic heart disease of native coronary artery without angina pectoris: Secondary | ICD-10-CM

## 2016-12-06 DIAGNOSIS — E0789 Other specified disorders of thyroid: Secondary | ICD-10-CM

## 2016-12-06 DIAGNOSIS — I10 Essential (primary) hypertension: Secondary | ICD-10-CM

## 2016-12-06 DIAGNOSIS — R0602 Shortness of breath: Secondary | ICD-10-CM

## 2016-12-06 LAB — D-DIMER, QUANTITATIVE (NOT AT ARMC): D-DIMER: 0.27 mg{FEU}/L (ref 0.00–0.49)

## 2016-12-06 MED ORDER — LOSARTAN POTASSIUM 100 MG PO TABS
100.0000 mg | ORAL_TABLET | Freq: Every day | ORAL | 3 refills | Status: DC
Start: 2016-12-06 — End: 2017-11-28

## 2016-12-06 MED ORDER — AMLODIPINE BESYLATE 10 MG PO TABS: 10.0000 mg | ORAL_TABLET | Freq: Every day | ORAL | 3 refills | Status: DC

## 2016-12-06 MED ORDER — ISOSORBIDE MONONITRATE ER 30 MG PO TB24: 30.0000 mg | ORAL_TABLET | Freq: Every day | ORAL | 3 refills | Status: DC

## 2016-12-06 NOTE — Progress Notes (Signed)
Cardiology Office Note    Date:  12/06/2016  ID:  Cynthia Slipperara Aramburo, DOB [birth year], MRN [id number] PCP:  Heron NayYoung, Lauren E, PA  Cardiologist:  Dr. Mayford Knifeurner   Chief Complaint: f/u SOB  History of Present Illness:  Cynthia Kirby is a 46 y.o. female with history of DM, HTN, anxiety, panic attacks, hyperlipidemia, and recently diagnosed CAD with NSTEMI s/p DES to mLAD who presents for follow-up. She was recently admitted 11/13/16 for significant chest pressure and dyspnea in the setting of high blood pressure. Troponin was positive at 0.59 c/w NSTEMI. LHC 11/13/16 showed 95% mLAD s/p DES, otherwise residual 50% mRCa, 40% mCx, 40% OM2, 70% D2, EF 55-65%. She was started on Apirin and Brilinta. A1C was 10.5 and LDL 120 prompting recommendation for aggressive medical therapy. OP 2D echo 10/27/16: moderate LVH, EF 60-65%, grade 1 DD. She was read[facility] 11/[facility] with persistent SOB - per Epic, no objective cause was found, negative cardiac enzymes, CXR clear. It was felt this was due to Brilinta and so she was changed to Effient. Her Lipitor was changed to Crestor. When I saw her at OV 11/29/16, she was continuing to note ongoing dyspnea and BP was elevated. Labs showed generally stable (and decreased Hgb) from prior - 11.0, TSH wnl, PBNP 134, Cr 1.03, K 4.1. Spironolactone was added.  She returns for follow-up still reporting ongoing SOB. This has been present ever since she received her stent. She does not recall feeling this way afterwards. She says it comes and goes when she least expects it - can happen when she's laying down doing nothing at all. Also reports exertional dyspnea anytime she does any kind of activity. She is requesting a letter to pause her gym membership while in progress of workup. She has occasional sharp stabbing right forearm pain and chest pain without particular pattern, not worse with inspiration, no recurrent chest pain like recent angina. Pulse ox sitting is 96%. Exertional pulse ox not  felt to be reliable given nail polish/artificial nails. No LEE.   Past Medical History:  Diagnosis Date  . Anemia   . Anxiety   . Arthritis    "left ankle; knees" (11/13/2016)  . CAD in native artery 11/12/2016   A. NSTEMI with LHC 11/13/16 RCA 50%, Circ 40%, 2nd OM 40%, 2nd diag 70%, Mid LAD 95%- DES placed. EF normal.  . Depression    "hx; not now" (11/13/2016)  . GERD (gastroesophageal reflux disease)   . Headache    "weekly" (11/13/2016)  . Hyperlipidemia   . Hypertension   . Hypothyroidism   . Migraine    "weekly" (11/13/2016)  . Sleep apnea    "CPAP RX'd; never used" (11/13/2016)  . Type II diabetes mellitus (HCC)     Past Surgical History:  Procedure Laterality Date  . CORONARY ANGIOPLASTY WITH STENT PLACEMENT  11/13/2016  . CORONARY STENT INTERVENTION N/A 11/13/2016   Procedure: Coronary Stent Intervention;  Surgeon: Corky CraftsVaranasi, Jayadeep S, MD;  [facility];  Service: Cardiovascular;  Laterality: N/A;  . LEFT HEART CATH AND CORONARY ANGIOGRAPHY N/A 11/13/2016   Procedure: Left Heart Cath and Coronary Angiography;  Surgeon: Corky CraftsVaranasi, Jayadeep S, MD;  [facility];  Service: Cardiovascular;  Laterality: N/A;  . TONSILLECTOMY AND ADENOIDECTOMY  2011    Current Medications: Current Outpatient Prescriptions  Medication Sig Dispense Refill  . amLODipine (NORVASC) 10 MG tablet Take 10 mg by mouth daily.    Marland Kitchen. aspirin 81 MG chewable  tablet Chew 1 tablet (81 mg total) by mouth daily. 30 tablet 11  . linagliptin (TRADJENTA) 5 MG TABS tablet Take 1 tablet (5 mg total) by mouth daily. 30 tablet 5  . losartan (COZAAR) 100 MG tablet Take 100 mg by mouth daily.    . metFORMIN (GLUCOPHAGE) 1000 MG tablet Take 1,000 mg by mouth daily.     . metoprolol succinate (TOPROL-XL) 25 MG 24 hr tablet Take 1 tablet (25 mg total) by mouth daily. 30 tablet 5  . nitroGLYCERIN (NITROSTAT) 0.4 MG SL tablet Place 1 tablet (0.4 mg total) under the tongue every 5 (five) minutes  x 3 doses as needed for chest pain. 25 tablet 3  . pantoprazole (PROTONIX) 40 MG tablet Take 40 mg by mouth.    . prasugrel (EFFIENT) 10 MG TABS tablet Take 10 mg by mouth daily.    . rosuvastatin (CRESTOR) 10 MG tablet Take 1 tablet (10 mg total) by mouth daily. 90 tablet 3  . Sodium Chloride-Sodium Bicarb (NETI POT SINUS WASH NA) Place 1 each into the nose as needed (for sinus).    Marland Kitchen spironolactone (ALDACTONE) 25 MG tablet Take 1 tablet (25 mg total) by mouth daily. 30 tablet 0   No current facility-administered medications for this visit.      Allergies:   Penicillins; Dapagliflozin; and Lisinopril   Social History   Social History  . Marital status: Single    Spouse name: N/A  . Number of children: N/A  . Years of education: N/A   Social History Main Topics  . Smoking status: Never Smoker  . Smokeless tobacco: Never Used  . Alcohol use Yes     Comment: 11/13/2016 "might have a couple drinks/year"  . Drug use: No  . Sexual activity: Yes    Birth control/ protection: None   Other Topics Concern  . None   Social History Narrative  . None     Family History:  Family History  Problem Relation Age of Onset  . Hypertension Mother   . Diabetes Mother   . Hypertension Father   . Diabetes Father        borderline  . Healthy Sister   . Healthy Brother     ROS:   Please see the history of present illness.  All other systems are reviewed and otherwise negative.    PHYSICAL EXAM:   VS:  BP 120/80   Pulse 81   Ht 5\' 5"  (1.651 m)   Wt 224 lb (101.6 kg)   BMI 37.28 kg/m   BMI: Body mass index is 37.28 kg/m. GEN: Well nourished, well developed AAF, obese, in no acute distress  HEENT: normocephalic, atraumatic Neck: no JVD, carotid bruits, or masses Cardiac: RRR; no murmurs, rubs, or gallops, no edema  Respiratory:  clear to auscultation bilaterally, normal work of breathing GI: soft, nontender, nondistended, + BS MS: no deformity or atrophy  Skin: warm and dry, no  rash Neuro:  Alert and Oriented x 3, Strength and sensation are intact, follows commands Psych: euthymic mood, full affect  Wt Readings from Last 3 Encounters:  12/06/16 224 lb (101.6 kg)  11/29/16 220 lb (99.8 kg)  11/14/16 216 lb 0.8 oz (98 kg)      Studies/Labs Reviewed:   EKG:  EKG was ordered today and personally reviewed by me and demonstrates NSR 81bpm, nonspecific ST changes in V2 otherwise improved from prior.  Recent Labs: 11/13/2016: ALT 12; B Natriuretic Peptide 67.6 11/29/2016: BUN 11; Creatinine, Ser  1.03; NT-Pro BNP 134; Potassium 4.1; Sodium 142; TSH 2.130 12/01/2016: Hemoglobin 11.0; Platelets 462   Lipid Panel    Component Value Date/Time   CHOL 208 (H) 11/13/2016 0627   TRIG 244 (H) 11/13/2016 0627   HDL 39 (L) 11/13/2016 0627   CHOLHDL 5.3 11/13/2016 0627   VLDL 49 (H) 11/13/2016 0627   LDLCALC 120 (H) 11/13/2016 4098    Additional studies/ records that were reviewed today include: Summarized above.    ASSESSMENT & PLAN:   1. Shortness of breath - etiology of ongoing dyspnea not totally clear, agree with prior [facility] records that this presentation has been somewhat atypical. Recent Hgb stable compared to prior. Not hypoxic, tachycardic or tachypneic. Volume status appears stable and recent CXR clear. Question whether there could be a component of microvascular angina. I discussed the case with Dr. Mayford Knife who recommended Lexiscan nuclear stress test to evaluate for residual ischemia - will arrange. Lexiscan chosen to avoid potentiating extremely high BP if we needed to hold meds for exercise. Add trial of Imdur. Will also check stat d-dimer. If positive will need to obtain CTA to exclude PE. If negative, will plan for above workup. Ultimately if nuc is normal, would consider pulm eval. She also admits to feeling more anxious since recent heart issues. 2. CAD in native artery - continue ASA, BB, statin. 3. HTN - controlled, improved on current regimen. Recheck BMET  given spironolactone initiation. 4. Hyperlipidemia - continue Crestor. This was changed due to perceived intolerance to atorvastatin. If tolerating at f/u appointment, consider titration of dose. 5. Thyroid fullness - question whether this could be having some effect on her SOB. Recent TFTs wnl. I asked her to contact her PCP to discuss workup.  Disposition: F/u with Dr. Mayford Knife or care team APP in 4 weeks.   Medication Adjustments/Labs and Tests Ordered: Current medicines are reviewed at length with the patient today.  Concerns regarding medicines are outlined above. Medication changes, Labs and Tests ordered today are summarized above and listed in the Patient Instructions accessible in Encounters.   Signed, Laurann Montana, PA-C  12/06/2016 3:08 PM    [facility] [address] [place], [place], [place]  [address] Phone: [phone]; Fax: [phone]

## 2016-12-06 NOTE — Patient Instructions (Addendum)
Medication Instructions:  Your physician has recommended you make the following change in your medication: 1.) START Imdur 30 mg daily  Labwork: Your physician recommends that you return for lab work today for BMET, d-dimer  Testing/Procedures: Your physician has requested that you have a lexiscan myoview. For further information please visit [email or web address]. Please follow instruction sheet, as given.    Follow-Up: Your physician recommends that you schedule a follow-up appointment in: 4 weeks with Dr. Mayford Knifeurner  Any Other Special Instructions Will Be Listed Below (If Applicable).     If you need a refill on your cardiac medications before your next appointment, please call your pharmacy.

## 2016-12-07 ENCOUNTER — Telehealth (HOSPITAL_COMMUNITY): Payer: Self-pay | Admitting: *Deleted

## 2016-12-07 LAB — BASIC METABOLIC PANEL
BUN/Creatinine Ratio: 10 (ref 9–23)
BUN: 9 mg/dL (ref 6–24)
CALCIUM: 10.1 mg/dL (ref 8.7–10.2)
CHLORIDE: 99 mmol/L (ref 96–106)
CO2: 24 mmol/L (ref 20–29)
Creatinine, Ser: 0.9 mg/dL (ref 0.57–1.00)
GFR calc Af Amer: 91 mL/min/{1.73_m2} (ref >59)
GFR calc non Af Amer: 79 mL/min/{1.73_m2} (ref >59)
Glucose: 131 mg/dL — ABNORMAL HIGH (ref 65–99)
Potassium: 4.4 mmol/L (ref 3.5–5.2)
Sodium: 139 mmol/L (ref 134–144)

## 2016-12-07 NOTE — Telephone Encounter (Signed)
Message addressed .Zack Seal/cy

## 2016-12-07 NOTE — Telephone Encounter (Signed)
Patient given detailed instructions per Myocardial Perfusion Study Information Sheet for the test on 12/12/16. Patient notified to arrive 15 minutes early and that it is imperative to arrive on time for appointment to keep from having the test rescheduled.  If you need to cancel or reschedule your appointment, please call the office within 24 hours of your appointment. . Patient verbalized understanding. Divinity Kyler Jacqueline     

## 2016-12-12 ENCOUNTER — Ambulatory Visit (HOSPITAL_COMMUNITY): Payer: BLUE CROSS/BLUE SHIELD | Attending: Cardiovascular Disease

## 2016-12-12 DIAGNOSIS — R0602 Shortness of breath: Secondary | ICD-10-CM | POA: Insufficient documentation

## 2016-12-12 MED ORDER — REGADENOSON 0.4 MG/5ML IV SOLN
0.4000 mg | Freq: Once | INTRAVENOUS | Status: AC
  Administered 2016-12-12: 0.4 mg via INTRAVENOUS

## 2016-12-12 MED ORDER — TECHNETIUM TC 99M TETROFOSMIN IV KIT
32.9000 | PACK | Freq: Once | INTRAVENOUS | Status: AC | PRN
  Administered 2016-12-12: 32.9 via INTRAVENOUS
  Filled 2016-12-12: qty 33

## 2016-12-13 ENCOUNTER — Ambulatory Visit (HOSPITAL_COMMUNITY): Payer: BLUE CROSS/BLUE SHIELD | Attending: Internal Medicine

## 2016-12-13 ENCOUNTER — Telehealth: Payer: Self-pay | Admitting: *Deleted

## 2016-12-13 DIAGNOSIS — R0602 Shortness of breath: Secondary | ICD-10-CM

## 2016-12-13 LAB — MYOCARDIAL PERFUSION IMAGING
CHL CUP NUCLEAR SRS: 1
CHL CUP NUCLEAR SSS: 1
CHL CUP RESTING HR STRESS: 80 {beats}/min
CSEPPHR: 99 {beats}/min
LVDIAVOL: 114 mL (ref 46–106)
LVSYSVOL: 42 mL
RATE: 0.25
SDS: 0
TID: 0.98

## 2016-12-13 MED ORDER — TECHNETIUM TC 99M TETROFOSMIN IV KIT
32.3000 | PACK | Freq: Once | INTRAVENOUS | Status: AC | PRN
  Administered 2016-12-13: 32.3 via INTRAVENOUS
  Filled 2016-12-13: qty 33

## 2016-12-13 NOTE — Telephone Encounter (Signed)
-----   Message from Laurann Montanaayna N Dunn, [place]PA-C sent at 12/13/2016  1:03 PM EDT ----- Please let patient know nuclear stress test was totally normal and blood pressure looked great. At this point it does not appear SOB is coming from her heart. Per prior discussion with Dr. Mayford Knifeurner, refer to pulmonology to discuss further workup of dyspnea. Please arrange f/u 4-6 weeks with Dr. Mayford Knifeurner - would prefer this be MD appt as MD has not seen patient since she was dc from hospital with readmission. Dayna Dunn PA-C

## 2016-12-21 ENCOUNTER — Institutional Professional Consult (permissible substitution): Payer: BLUE CROSS/BLUE SHIELD | Admitting: Internal Medicine

## 2016-12-25 ENCOUNTER — Other Ambulatory Visit: Payer: Self-pay | Admitting: Physician Assistant

## 2016-12-25 DIAGNOSIS — I10 Essential (primary) hypertension: Secondary | ICD-10-CM

## 2016-12-25 DIAGNOSIS — E785 Hyperlipidemia, unspecified: Secondary | ICD-10-CM

## 2016-12-25 DIAGNOSIS — I2510 Atherosclerotic heart disease of native coronary artery without angina pectoris: Secondary | ICD-10-CM

## 2016-12-25 DIAGNOSIS — E049 Nontoxic goiter, unspecified: Secondary | ICD-10-CM

## 2016-12-25 DIAGNOSIS — E876 Hypokalemia: Secondary | ICD-10-CM

## 2016-12-25 DIAGNOSIS — R0602 Shortness of breath: Secondary | ICD-10-CM

## 2016-12-25 MED ORDER — PRASUGREL HCL 10 MG PO TABS: 10.0000 mg | ORAL_TABLET | Freq: Every day | ORAL | 12 refills | Status: AC

## 2017-01-02 ENCOUNTER — Ambulatory Visit (INDEPENDENT_AMBULATORY_CARE_PROVIDER_SITE_OTHER): Payer: BLUE CROSS/BLUE SHIELD | Admitting: Pulmonary Disease

## 2017-01-02 ENCOUNTER — Other Ambulatory Visit: Payer: BLUE CROSS/BLUE SHIELD

## 2017-01-02 ENCOUNTER — Encounter: Payer: Self-pay | Admitting: Pulmonary Disease

## 2017-01-02 DIAGNOSIS — R0600 Dyspnea, unspecified: Secondary | ICD-10-CM | POA: Diagnosis not present

## 2017-01-02 DIAGNOSIS — J309 Allergic rhinitis, unspecified: Secondary | ICD-10-CM

## 2017-01-02 DIAGNOSIS — G4733 Obstructive sleep apnea (adult) (pediatric): Secondary | ICD-10-CM

## 2017-01-02 DIAGNOSIS — K219 Gastro-esophageal reflux disease without esophagitis: Secondary | ICD-10-CM

## 2017-01-02 DIAGNOSIS — E119 Type 2 diabetes mellitus without complications: Secondary | ICD-10-CM | POA: Insufficient documentation

## 2017-01-02 NOTE — Patient Instructions (Addendum)
   Call or e-mail me if you notice any new breathing problems or worsening of your symptoms before I see you back.  We will review your test results at your next appointment.  TESTS ORDERED: 1. Full pulmonary function testing before next appointment 2. 6 minute walk test on room air before next appointment 3. Maxillofacial CT Sinus LTD w/o 4. Esophagram/Barium Swallow 5. Serum RAST Panel

## 2017-01-02 NOTE — Progress Notes (Signed)
Subjective:    Patient ID: Cynthia Kirby, female    DOB: [birth year], 46 y.o.   MRN: [id number]  HPI She denies any breathing problems before her heart attack in May. On 5/21 she underwent stent placement to her LAD. 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 She reports she has noticed dyspnea since being placed on Brilinta. She reports she frankly "could not breathe". She reports she was seen at [facility] and transitioned to [facility]. She reports her dyspnea has improved somewhat but is still present. She reports she has dyspnea at rest. She reports significant problems now with attempting to sing. She reports on Brilinta she was only able to walk about 6 feet due to her dyspnea. She reports she has had a walk test that showed significant desaturation during a recent visit with Cardiology. She does have cough with her dyspnea that produces a "light" colored mucus. She reports she does have significant sinus congestion & drainage but doesn't notice significant post-nasal drainage. She tried Nexium on her own which seemed to help so she was started on Protonix. She reports she has had increased reflux & dyspepsia since her heart attack. She does wake up with a brash water taste that is rare. She reports she has weakness in her voice but no change in her voice quality. She has also noticed wheezing with her dyspnea. She reports she has more problems with her breathing during times of high anxiety. She does have dysphagia even with soft foods at times. She denies any odynophagia. No history of breathing problems or allergies as a child. She has had more sinus allergies since moving to [place]. She reports a remote history of bronchitis but no pneumonia.  She reports increased dyspnea with exposure to animals, perfumes, and strong odors. She reports she had a sleep study in 2010 that showed sleep apnea but never was treated with CPAP. She has lost weight since then (weighed 275lbs) at the  time and had significant decrease in her snoring.   Review of Systems She reports she does have intermittent fever & sweats but no chills. No rashes or bruising. A pertinent 14 point review of systems is negative except as per the history of presenting illness.  Allergies  Allergen Reactions  . Penicillins Anaphylaxis and Itching  . Dapagliflozin     Other reaction(s): Other Genital mycotic infections  . Lisinopril     Other reaction(s): COUGH    Current Outpatient Prescriptions on File Prior to Visit  Medication Sig Dispense Refill  . amLODipine (NORVASC) 10 MG tablet Take 1 tablet (10 mg total) by mouth daily. 90 tablet 3  . aspirin 81 MG chewable tablet Chew 1 tablet (81 mg total) by mouth daily. 30 tablet 11  . linagliptin (TRADJENTA) 5 MG TABS tablet Take 1 tablet (5 mg total) by mouth daily. 30 tablet 5  . losartan (COZAAR) 100 MG tablet Take 1 tablet (100 mg total) by mouth daily. 90 tablet 3  . metFORMIN (GLUCOPHAGE) 1000 MG tablet Take 1,000 mg by mouth daily.     . metoprolol succinate (TOPROL-XL) 25 MG 24 hr tablet Take 1 tablet (25 mg total) by mouth daily. 30 tablet 5  . nitroGLYCERIN (NITROSTAT) 0.4 MG SL tablet Place 1 tablet (0.4 mg total) under the tongue every 5 (five) minutes x 3 doses as needed for chest pain. 25 tablet 3  . prasugrel (EFFIENT) 10 MG TABS tablet Take 1 tablet (10 mg total)  by mouth daily. 30 tablet 12  . rosuvastatin (CRESTOR) 10 MG tablet Take 1 tablet (10 mg total) by mouth daily. 90 tablet 3  . spironolactone (ALDACTONE) 25 MG tablet Take 1 tablet (25 mg total) by mouth daily. 30 tablet 0  . isosorbide mononitrate (IMDUR) 30 MG 24 hr tablet Take 1 tablet (30 mg total) by mouth daily. (Patient not taking: Reported on 01/02/2017) 90 tablet 3  . Sodium Chloride-Sodium Bicarb (NETI POT SINUS WASH NA) Place 1 each into the nose as needed (for sinus).     No current facility-administered medications on file prior to visit.     Past Medical History:    Diagnosis Date  . Allergic rhinitis   . Anemia   . Anxiety   . Arthritis    "left ankle; knees" (11/13/2016)  . CAD in native artery 11/12/2016   A. NSTEMI with LHC 11/13/16 RCA 50%, Circ 40%, 2nd OM 40%, 2nd diag 70%, Mid LAD 95%- DES placed. EF normal.  . Depression    "hx; not now" (11/13/2016)  . GERD (gastroesophageal reflux disease)   . Headache    "weekly" (11/13/2016)  . Hyperlipidemia   . Hypertension   . Hypothyroidism   . Migraine    "weekly" (11/13/2016)  . Sleep apnea    "CPAP RX'd; never used" (11/13/2016)  . Type II diabetes mellitus (HCC)     Past Surgical History:  Procedure Laterality Date  . CORONARY ANGIOPLASTY WITH STENT PLACEMENT  11/13/2016  . CORONARY STENT INTERVENTION N/A 11/13/2016   Procedure: Coronary Stent Intervention;  Surgeon: Corky CraftsVaranasi, Jayadeep S, MD;  [facility];  Service: Cardiovascular;  Laterality: N/A;  . INDUCED ABORTION    . LEFT HEART CATH AND CORONARY ANGIOGRAPHY N/A 11/13/2016   Procedure: Left Heart Cath and Coronary Angiography;  Surgeon: Corky CraftsVaranasi, Jayadeep S, MD;  [facility];  Service: Cardiovascular;  Laterality: N/A;  . TONSILLECTOMY AND ADENOIDECTOMY  2011    Family History  Problem Relation Age of Onset  . Hypertension Mother   . Diabetes Mother   . Hypertension Father   . Diabetes Father        borderline  . Prostate cancer Father   . Healthy Sister   . Healthy Brother   . Lung disease Neg Hx     Social History   Social History  . Marital status: Single    Spouse name: N/A  . Number of children: N/A  . Years of education: N/A   Social History Main Topics  . Smoking status: Passive Smoke Exposure - Never Smoker    Types: Cigarettes  . Smokeless tobacco: Never Used     Comment: friends and family.   . Alcohol use Yes     Comment: 11/13/2016 "might have a couple drinks/year"  . Drug use: No  . Sexual activity: Yes    Birth control/ protection: None   Other Topics Concern  .  None   Social History Narrative   [facility] (01/02/17):   Originally from [place]. [place]. She is a Scientist, research (life sciences)vocal coach. Her parents retired to [place]. 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 She reports remote exposure to a cockatiel & parakeets. She has questionable recent exposure to mold. No hot tub exposure. Enjoys writing music.       Objective:   Physical Exam BP 126/82 (BP Location: Left Arm, Patient Position: Sitting, Cuff Size: Normal)  Pulse 85   Ht 5\' 6"  (1.676 m)   Wt 224 lb (101.6 kg)   SpO2 97%   BMI 36.15 kg/m  General:  Awake. Alert. No acute distress. Mild central obesity. Integument:  Warm & dry. No rash on exposed skin. No bruising. Extremities:  No cyanosis or clubbing.  Lymphatics:  No appreciated cervical or supraclavicular lymphadenoapthy. HEENT:  Moist mucus membranes. No oral ulcers. No scleral injection or icterus. Moderate bilateral nasal turbinate swelling. Cardiovascular:  Regular rate and rhythm. No edema. No appreciable JVD.  Pulmonary:  Good aeration & clear to auscultation bilaterally. Symmetric chest wall expansion. No accessory muscle use on room air. Abdomen: Soft. Normal bowel sounds. Mildly protuberant. Grossly nontender. Musculoskeletal:  Normal bulk and tone. Hand grip strength 5/5 bilaterally. No joint deformity or effusion appreciated. Neurological:  CN 2-12 grossly in tact. No meningismus. Moving all 4 extremities equally. Symmetric brachioradialis deep tendon reflexes. Psychiatric:  Mood and affect congruent. Speech normal rhythm, rate & tone.   IMAGING CXR PA/LAT 11/24/16 (personally reviewed by me):  No parenchymal mass or opacity appreciated. No pleural effusion. Heart normal in size & mediastinum normal in contour.  CT CHEST W/ CONTRAST 03/27/15 (personally reviewed by me):  No parenchymal nodule, mass, or opacity appreciated. Normal airways. No pleural effusion or thickening. No  pericardial effusion. No pathologic mediastinal adenopathy. Small hiatal hernia noted. Hepatic steatosis present on limited upper abdominal windows. Also some scoliosis is present.  CARDIAC TTE (11/27/16):  LV normal in size with moderate LVH. EF 60-65% with grade 1 diastolic dysfunction. LA & RA normal in size. RV normal in size and function. No aortic stenosis or regurgitation. Aortic root normal in size. No mitral stenosis or regurgitation. No pulmonic regurgitation. Trivial tricuspid regurgitation. No pericardial effusion.  LHC (11/13/16):  Mid RCA lesion, 50 %stenosed. Diffuse stenosis.  Mid Cx lesion, 40 %stenosed. Diffuse stenosis.  Ost 2nd Mrg to 2nd Mrg lesion, 40 %stenosed.  Ost 2nd Diag to 2nd Diag lesion, 70 %stenosed.  The left ventricular systolic function is normal.  LV end diastolic pressure is normal.  The left ventricular ejection fraction is 55-65% by visual estimate.  There is no aortic valve stenosis.  Mid LAD lesion, 95 %stenosed. A STENT SYNERGY DES 3X32 drug eluting stent was successfully placed, postdilated to 3.6 mm.  Post intervention, there is a 0% residual stenosis.  LABS 12/01/16  CBC: 8.5 / 11.0 / 32.8 / 462 Eos:  3    Assessment & Plan:  46 y.o. female with dyspnea which seems to have started since her myocardial infarction. However, reviewing her previous chest CT imaging from 2016 does show dyspnea at that time as well. With her description of reactivity to inhaled irritants including animals and animal dander I do question whether or not she could have some developing asthma. Her reflux seems to be reasonably well controlled but with her dysphagia I believe imaging is necessary. Certainly with her chronic allergic rhinitis postnasal drainage could be contributing to her cough as well; although, I do not see this on physical exam today. I instructed the patient to notify me if she had any new breathing problems or questions before her next appointment.    1. Dyspnea: Suspect underlying asthma. Checking full pulmonary function testing as well as 6 minute walk test on room air before next appointment. 2. GERD: Continuing Protonix. Checking esophagogram/barium swallow. 3. Chronic allergic rhinitis: Checking maxillofacial CT scan without contrast & RAST panel. Patient continuing on nasal saline rinse and  Flonase. 4. OSA: Patient has lost significant weight since previous testing. Plan to readdress at next appointment and consider repeat polysomnogram. 5. Health maintenance: Plan to address at next appointment. 6. Follow-up: Patient to return to clinic in 6 weeks or sooner if needed.  Donna Christen Jamison Neighbor, M.D. [facility] Pulmonary & Critical Care Pager:  [phone] After 3pm or if no response, call [phone] 11:20 AM 01/02/17

## 2017-01-03 ENCOUNTER — Telehealth: Payer: Self-pay | Admitting: Physician Assistant

## 2017-01-03 DIAGNOSIS — R0602 Shortness of breath: Secondary | ICD-10-CM

## 2017-01-03 DIAGNOSIS — I10 Essential (primary) hypertension: Secondary | ICD-10-CM

## 2017-01-03 DIAGNOSIS — E785 Hyperlipidemia, unspecified: Secondary | ICD-10-CM

## 2017-01-03 DIAGNOSIS — E049 Nontoxic goiter, unspecified: Secondary | ICD-10-CM

## 2017-01-03 DIAGNOSIS — E876 Hypokalemia: Secondary | ICD-10-CM

## 2017-01-03 DIAGNOSIS — I2510 Atherosclerotic heart disease of native coronary artery without angina pectoris: Secondary | ICD-10-CM

## 2017-01-03 LAB — RESPIRATORY ALLERGY PROFILE REGION II ~~LOC~~
Allergen, C. Herbarum, M2: <0.1 kU/L
Allergen, D pternoyssinus,d7: <0.1 kU/L
Allergen, Mulberry, t76: <0.1 kU/L
Bermuda Grass: <0.1 kU/L
Box Elder IgE: <0.1 kU/L
Cat Dander: <0.1 kU/L
Cockroach: <0.1 kU/L
D. farinae: <0.1 kU/L
Dog Dander: <0.1 kU/L
Elm IgE: <0.1 kU/L
IGE (IMMUNOGLOBULIN E), SERUM: 13 kU/L (ref <115)
Johnson Grass: <0.1 kU/L
Pecan/Hickory Tree IgE: <0.1 kU/L
Sheep Sorrel IgE: <0.1 kU/L
Timothy Grass: <0.1 kU/L

## 2017-01-03 NOTE — Telephone Encounter (Signed)
°*  STAT* If patient is at the pharmacy, call can be transferred to refill team.   1. Which medications need to be refilled? (please list name of each medication and dose if known) spironolactone 25mg  2. Which pharmacy/location (including street and city if local pharmacy) is medication to be sent to? [facility] on [address]  3. Do they need a 30 day or 90 day supply? 30  Pt verbalized she want so speak to rn to see if she needs to continue taking the medication she has been out for 3 days

## 2017-01-04 MED ORDER — SPIRONOLACTONE 25 MG PO TABS: 25.0000 mg | ORAL_TABLET | Freq: Every day | ORAL | 0 refills | Status: DC

## 2017-01-04 NOTE — Telephone Encounter (Signed)
Returned pts call re: spironolactone.  Per Ronie Spiesayna Dunn, PA-C, it is ok to refill. Couldn't leave a message, pts voicemail is full.

## 2017-01-04 NOTE — Telephone Encounter (Signed)
Pt called back and she has been made aware her refill of Spironolactone has been sent to her pharmacy She verbalized understanding

## 2017-01-04 NOTE — Telephone Encounter (Signed)
Ok to refill Cardinal HealthDayna Dunn PA-C

## 2017-01-08 ENCOUNTER — Ambulatory Visit (HOSPITAL_COMMUNITY)
Admission: RE | Admit: 2017-01-08 | Discharge: 2017-01-08 | Disposition: A | Discharge status: Home / Self Care | Payer: BLUE CROSS/BLUE SHIELD | Source: Ambulatory Visit | Attending: Pulmonary Disease | Admitting: Pulmonary Disease

## 2017-01-08 DIAGNOSIS — K219 Gastro-esophageal reflux disease without esophagitis: Secondary | ICD-10-CM | POA: Diagnosis not present

## 2017-01-08 DIAGNOSIS — R600 Localized edema: Secondary | ICD-10-CM | POA: Insufficient documentation

## 2017-01-08 DIAGNOSIS — J309 Allergic rhinitis, unspecified: Secondary | ICD-10-CM

## 2017-01-09 ENCOUNTER — Ambulatory Visit (HOSPITAL_COMMUNITY)
Admission: RE | Admit: 2017-01-09 | Discharge: 2017-01-09 | Disposition: A | Discharge status: Home / Self Care | Payer: BLUE CROSS/BLUE SHIELD | Source: Ambulatory Visit | Attending: Pulmonary Disease | Admitting: Pulmonary Disease

## 2017-01-09 ENCOUNTER — Telehealth: Payer: Self-pay | Admitting: Pulmonary Disease

## 2017-01-09 DIAGNOSIS — K219 Gastro-esophageal reflux disease without esophagitis: Secondary | ICD-10-CM

## 2017-01-09 NOTE — Telephone Encounter (Signed)
Notes recorded by Roslynn AmbleNestor, Jennings E, MD on 01/08/2017 at 11:31 PM EDT Please let the patient know that her sinus CT scan shows only mild inflammation. She should continue with her saline rinses and Flonase. Thank you.  ---------- Spoke with pt, aware of results/recs.  Nothing further needed.

## 2017-01-10 ENCOUNTER — Telehealth: Payer: Self-pay | Admitting: Physician Assistant

## 2017-01-10 NOTE — Telephone Encounter (Signed)
New message      Pt had a heart attack 11-12-16 and a stent on 11-13-16.  She has not heard from cardiac rehab.  Calling to see if Kriste BasqueDayna still want her to go to cardiac rehab? Please call

## 2017-01-10 NOTE — Telephone Encounter (Signed)
Yes, please refer. Stephenia Vogan PA-C

## 2017-01-11 ENCOUNTER — Ambulatory Visit: Payer: BLUE CROSS/BLUE SHIELD | Admitting: Cardiology

## 2017-01-11 ENCOUNTER — Other Ambulatory Visit: Payer: Self-pay | Admitting: *Deleted

## 2017-01-11 DIAGNOSIS — Z955 Presence of coronary angioplasty implant and graft: Secondary | ICD-10-CM

## 2017-01-11 NOTE — Telephone Encounter (Signed)
Returned pts call re: Cardiac Rehab.  Left a message for her to call back. I will let pt know that the hospital put her referral in but no location.  I have put in a new order, so someone should be contacting her.

## 2017-01-11 NOTE — Telephone Encounter (Signed)
Pt returned my call and she has been made aware of her referral for cardiac rehab. She verbalized appreciation

## 2017-01-12 ENCOUNTER — Telehealth (HOSPITAL_COMMUNITY): Payer: Self-pay

## 2017-01-12 ENCOUNTER — Telehealth: Payer: Self-pay | Admitting: Physician Assistant

## 2017-01-12 NOTE — Telephone Encounter (Signed)
I called and spoke to patient about cardiac rehab referral. Patient would like for me to proceed with scheduling [place]. I informed patient that I would be her contact person. Patient informed that I needed to obtain more information from cardiologist office (new order needs to be put in Epic, [place] signed order and we need a order signed from MD) before I can proceed with scheduling. Patient thanked me for contacting her.

## 2017-01-12 NOTE — Telephone Encounter (Signed)
Monica from Cardiac Rehab called back and she will contact the pt and get her set up for Rehab.

## 2017-01-12 NOTE — Telephone Encounter (Signed)
Returned General Dynamicsquaya's call from [facility]. She was questioning pts referral that was put in. I advised her that I put that [facility] and in instructions asked for [facility] if available. [facility] to make sure pts referral is taken care of.

## 2017-01-12 NOTE — Telephone Encounter (Signed)
New message    [name] is calling asking if pt referral for cardiac rehab should be for Cynthia Kirby since [place] lives in [place]. Or did pt request to go to [place].

## 2017-01-12 NOTE — Telephone Encounter (Signed)
Follow Up:   Please call Monica,concerning Enis Slipperara Detlefsen.

## 2017-01-12 NOTE — Telephone Encounter (Signed)
Returned Cynthia Kirby's call from Cardiac Rehab. Left her a message to call back.

## 2017-01-13 ENCOUNTER — Other Ambulatory Visit (HOSPITAL_COMMUNITY): Payer: Self-pay | Admitting: *Deleted

## 2017-01-13 DIAGNOSIS — Z955 Presence of coronary angioplasty implant and graft: Secondary | ICD-10-CM

## 2017-01-15 ENCOUNTER — Telehealth: Payer: Self-pay

## 2017-01-15 ENCOUNTER — Telehealth (HOSPITAL_COMMUNITY): Payer: Self-pay

## 2017-01-15 ENCOUNTER — Telehealth (HOSPITAL_COMMUNITY): Payer: Self-pay | Admitting: Pharmacist

## 2017-01-15 DIAGNOSIS — I214 Non-ST elevation (NSTEMI) myocardial infarction: Secondary | ICD-10-CM

## 2017-01-15 NOTE — Telephone Encounter (Signed)
Patient insurance is active and benefits verified. Patient has BCBS - no co-payment, deductible $400/4400 has been met, out of pocket $800/$800 has been met, 30% co-insurance, no pre-authorization and no limit on visit. Passport/reference [id number].

## 2017-01-15 NOTE — Telephone Encounter (Signed)
Cardiac Rehab - referral placed

## 2017-01-15 NOTE — Telephone Encounter (Signed)
-----   Message from Chelsea Ausarlette B Carlton, RN sent at 01/12/2017  3:23 PM EDT ----- Regarding: Cardiac rehab Dr. Mayford Knifeurner, Pt was re referred to cardiac rehab here at [facility].  Pt was [facility] in May [facility] ( which [place] to her [place])  Pt was never contacted and now desires to participate here.  Electronic referral is signed by Bernette Mayersanya Dunn PA.  Unable to move forward on scheduling pt until either this is cosigned or new referral for coronary stents is placed and signed by MD.  Thanks for your assistance Karlene Linemanarlette Carlton RN, BSN Cardiac and Pulmonary Rehab Nurse Navigator

## 2017-01-18 ENCOUNTER — Telehealth (HOSPITAL_COMMUNITY): Payer: Self-pay

## 2017-01-19 NOTE — Telephone Encounter (Signed)
Could not reach patient on 7/27 or prior encounter. Will continue to attempt until her cardiac rehab day. Pharmacy.  Ladell PierBrooke Winda Summerall, PharmD Pharmacy Resident Pager: [phone] 01/19/2017 12:04 PM

## 2017-01-22 ENCOUNTER — Telehealth (HOSPITAL_COMMUNITY): Payer: Self-pay | Admitting: *Deleted

## 2017-01-22 NOTE — Telephone Encounter (Signed)
Cardiac Rehab Medication Review by a Pharmacist  Does the patient  feel that his/her medications are working for him/her?  yes  Has the patient been experiencing any side effects to the medications prescribed?  yes  Does the patient measure his/her own blood pressure or blood glucose at home?  yes   Does the patient have any problems obtaining medications due to transportation or finances?   no  Understanding of regimen: fair Understanding of indications: fair Potential of compliance: fair   Pharmacist comments: Pt is a 46yoF presenting for cardiac rehab orientation. Overall, she is overwhelmed by the number of medications that she is taking. Of note, her metformin is prescribed BID but she says she usually only takes it once daily. Her pantoprazole is prescribed BID but she only uses it PRN. She has a prescription for Imdur, but has not been taking it as she is unsure that she is supposed to be on so many blood pressure medications. I recommended that she continue taking the medication unless she has been instructed otherwise by a physician. She agreed to speak with her provider about the issue. She briefly mentioned some issues she has been having that may be medication side effects, including migraines, dizziness, and nausea. She is upset that she cannot go to the gym and is excited to be able to work out at rehab. She measures her blood pressure at home, which usually reads in the 130s/80s, but feels that her home cuff is not as accurate as at the doctor's office, where her BP is usually normal. Her blood glucose usually reads in the 120-130s. She has recently been having issues with readings in the 200s because she has increased her intake of "sugar free" beverages and candies, but realized that this may be having an effect on her sugars and has stopped.   Roderic ScarceErin N. Zigmund Danieleja, PharmD PGY1 Pharmacy Resident Pager: [phone]  01/22/2017 11:48 AM

## 2017-01-23 ENCOUNTER — Inpatient Hospital Stay (HOSPITAL_COMMUNITY): Admission: RE | Admit: 2017-01-23 | Payer: BLUE CROSS/BLUE SHIELD | Source: Ambulatory Visit

## 2017-01-29 ENCOUNTER — Telehealth (HOSPITAL_COMMUNITY): Payer: Self-pay | Admitting: *Deleted

## 2017-01-29 ENCOUNTER — Ambulatory Visit (INDEPENDENT_AMBULATORY_CARE_PROVIDER_SITE_OTHER): Payer: BLUE CROSS/BLUE SHIELD | Admitting: Physician Assistant

## 2017-01-29 ENCOUNTER — Encounter: Payer: Self-pay | Admitting: Physician Assistant

## 2017-01-29 VITALS — BP 124/88 | HR 90 | Ht 65.0 in | Wt 228.4 lb

## 2017-01-29 DIAGNOSIS — E785 Hyperlipidemia, unspecified: Secondary | ICD-10-CM | POA: Diagnosis not present

## 2017-01-29 DIAGNOSIS — I10 Essential (primary) hypertension: Secondary | ICD-10-CM

## 2017-01-29 DIAGNOSIS — I2510 Atherosclerotic heart disease of native coronary artery without angina pectoris: Secondary | ICD-10-CM | POA: Diagnosis not present

## 2017-01-29 DIAGNOSIS — R0602 Shortness of breath: Secondary | ICD-10-CM | POA: Diagnosis not present

## 2017-01-29 MED ORDER — SPIRONOLACTONE 25 MG PO TABS: 25.0000 mg | ORAL_TABLET | Freq: Every day | ORAL | 0 refills | Status: DC

## 2017-01-29 NOTE — Progress Notes (Addendum)
Cardiology Office Note    Date:  01/29/2017  ID:  Cynthia Kirby, DOB 12-07-1970, MRN [id number] PCP:  Cynthia Nay, PA  Cardiologist:  Dr. Mayford Knife   Chief Complaint: f/u CAD  History of Present Illness:  Cynthia Kirby is a 46 y.o. female with history of DM, HTN, anxiety, panic attacks, hyperlipidemia, and recently diagnosed CAD with NSTEMI s/p DES to mLAD who presents for follow-up. She was admitted 11/13/16 for significant chest pressure and dyspnea in the setting of high blood pressure. Troponin was positive at 0.59 c/w NSTEMI. LHC 11/13/16 showed 95% mLAD s/p DES, otherwise residual 50% mRCa, 40% mCx, 40% OM2, 70% D2, EF 55-65%. She was started on Apirin and Brilinta. A1C was 10.5 and LDL 120 prompting recommendation for aggressive medical therapy. OP 2D echo 11/27/16: moderate LVH, EF 60-65%, grade 1 DD. She was readmitted 11/24/16 to [facility] with persistent SOB - per Epic, no objective cause was found, negative cardiac enzymes, CXR clear. It was felt this was due to Brilinta and so she was changed to Effient. Her Lipitor was changed to Crestor. When I saw her at OV 11/29/16, she was continuing to note ongoing dyspnea and BP was elevated. Labs showed generally stable (and decreased Hgb) from prior - 11.0, TSH wnl, PBNP 134, Cr 1.03, K 4.1, d-dimer negative. Spironolactone was added for BP control with stable f/u labs. She was advised to avoid pregnancy given multiple incompatible meds and recent MI. At last f/u 11/2016 she was continuing to report dyspnea. Dr. Mayford Knife recommended nuclear stress test to r/o progression of disease, which was normal 12/12/16. No clearcut cardiac cause was identified so she was referred to pulmonology who felt this was likely due to underlying asthma. PFTs pending.  She returns for follow-up at the request of cardiac rehab prior to starting her classes. She overall states she's feeling much better. She has occasional sharp shooting brief "jolts" of chest pain but no significant  angina-like pain. Dyspnea is overall better as well. She has historically arrived quite late to each of her cardiology appointments. Today she states that this is due to social anxiety. She also has struggled with intermittent migraines and dizziness. These are worse when she is anxious. She has rare palpitations like a "poke," not particularly associated with the dizziness. No random sustained tachypalpitations. No syncope, orthopnea. She reports occasional LEE which is not apparent on exam today.   Past Medical History:  Diagnosis Date  . Allergic rhinitis   . Anemia   . Anxiety   . Arthritis    "left ankle; knees" (11/13/2016)  . CAD in native artery 11/12/2016   A. NSTEMI with LHC 11/13/16 RCA 50%, Circ 40%, 2nd OM 40%, 2nd diag 70%, Mid LAD 95%- DES placed. EF normal.  . Depression    "hx; not now" (11/13/2016)  . GERD (gastroesophageal reflux disease)   . Headache    "weekly" (11/13/2016)  . Hyperlipidemia   . Hypertension   . Hypothyroidism   . Migraine    "weekly" (11/13/2016)  . Sleep apnea    "CPAP RX'd; never used" (11/13/2016)  . Type II diabetes mellitus (HCC)     Past Surgical History:  Procedure Laterality Date  . CORONARY ANGIOPLASTY WITH STENT PLACEMENT  11/13/2016  . CORONARY STENT INTERVENTION N/A 11/13/2016   Procedure: Coronary Stent Intervention;  Surgeon: Corky Crafts, MD;  Location: [facility] INVASIVE CV LAB;  Service: Cardiovascular;  Laterality: N/A;  . INDUCED ABORTION    . LEFT HEART  CATH AND CORONARY ANGIOGRAPHY N/A 11/13/2016   Procedure: Left Heart Cath and Coronary Angiography;  Surgeon: Corky CraftsVaranasi, Jayadeep S, MD;  [facility];  Service: Cardiovascular;  Laterality: N/A;  . TONSILLECTOMY AND ADENOIDECTOMY  2011    Current Medications: Current Meds  Medication Sig  . amLODipine (NORVASC) 10 MG tablet Take 1 tablet (10 mg total) by mouth daily.  Marland Kitchen. aspirin 81 MG chewable tablet Chew 1 tablet (81 mg total) by mouth daily.  . fluticasone  (FLONASE) 50 MCG/ACT nasal spray Place 2 sprays into both nostrils daily.  Marland Kitchen. linagliptin (TRADJENTA) 5 MG TABS tablet Take 1 tablet (5 mg total) by mouth daily.  Marland Kitchen. losartan (COZAAR) 100 MG tablet Take 1 tablet (100 mg total) by mouth daily.  . metFORMIN (GLUCOPHAGE) 1000 MG tablet Take 1,000 mg by mouth daily.   . metoprolol succinate (TOPROL-XL) 25 MG 24 hr tablet Take 1 tablet (25 mg total) by mouth daily. (Patient taking differently: Take 50 mg by mouth daily. )  . nitroGLYCERIN (NITROSTAT) 0.4 MG SL tablet Place 1 tablet (0.4 mg total) under the tongue every 5 (five) minutes x 3 doses as needed for chest pain.  Marland Kitchen. ondansetron (ZOFRAN) 8 MG tablet Take 1 tablet by mouth every 8 (eight) hours as needed.  . pantoprazole (PROTONIX) 40 MG tablet Take 1 tablet by mouth 2 (two) times daily as needed.   . rosuvastatin (CRESTOR) 10 MG tablet Take 1 tablet (10 mg total) by mouth daily.  . saxagliptin HCl (ONGLYZA) 2.5 MG TABS tablet Take 5 mg by mouth daily.  . Sodium Chloride-Sodium Bicarb (NETI POT SINUS WASH NA) Place 1 each into the nose as needed (for sinus).  Marland Kitchen. spironolactone (ALDACTONE) 25 MG tablet Take 1 tablet (25 mg total) by mouth daily.     Allergies:   Penicillins; Dapagliflozin; and Lisinopril   Social History   Social History  . Marital status: Single    Spouse name: N/A  . Number of children: N/A  . Years of education: N/A   Social History Main Topics  . Smoking status: Passive Smoke Exposure - Never Smoker    Types: Cigarettes  . Smokeless tobacco: Never Used     Comment: friends and family.   . Alcohol use Yes     Comment: 11/13/2016 "might have a couple drinks/year"  . Drug use: No  . Sexual activity: Yes    Birth control/ protection: None   Other Topics Concern  . None   Social History Narrative   [facility] (01/02/17):   Originally [place]. [place]. She is a Scientist, research (life sciences)vocal coach. Her [place] retired to [place]. 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 She reports remote exposure to a cockatiel & parakeets. She has questionable recent exposure to mold. No hot tub exposure. Enjoys writing music.      Family History:  Family History  Problem Relation Age of Onset  . Hypertension Mother   . Diabetes Mother   . Hypertension Father   . Diabetes Father        borderline  . Prostate cancer Father   . Healthy Sister   . Healthy Brother   . Lung disease Neg Hx     ROS:   Please see the history of present illness.  All other systems are reviewed and otherwise negative.    PHYSICAL EXAM:   VS:  BP 124/88   Pulse 90  Ht 5\' 5"  (1.651 m)   Wt 228 lb 6.4 oz (103.6 kg)   LMP  (LMP Unknown)   BMI 38.01 kg/m   BMI: Body mass index is 38.01 kg/m. GEN: Well nourished, well developed, in no acute distress  HEENT: normocephalic, atraumatic Neck: no JVD, carotid bruits, or masses Cardiac: RRR; no murmurs, rubs, or gallops, no edema  Respiratory:  clear to auscultation bilaterally, normal work of breathing GI: soft, nontender, nondistended, + BS MS: no deformity or atrophy  Skin: warm and dry, no rash Neuro:  Alert and Oriented x 3, Strength and sensation are intact, follows commands Psych: euthymic mood, full affect  Wt Readings from Last 3 Encounters:  01/29/17 228 lb 6.4 oz (103.6 kg)  01/02/17 224 lb (101.6 kg)  12/12/16 224 lb (101.6 kg)      Studies/Labs Reviewed:   EKG:  EKG was ordered today and personally reviewed by me and demonstrates NSR no acute changes.  Recent Labs: 11/13/2016: ALT 12; B Natriuretic Peptide 67.6 11/29/2016: NT-Pro BNP 134; TSH 2.130 12/01/2016: Hemoglobin 11.0; Platelets 462 12/06/2016: BUN 9; Creatinine, Ser 0.90; Potassium 4.4; Sodium 139   Lipid Panel    Component Value Date/Time   CHOL 208 (H) 11/13/2016 0627   TRIG 244 (H) 11/13/2016 0627   HDL 39 (L) 11/13/2016 0627   CHOLHDL 5.3 11/13/2016 0627   VLDL 49 (H) 11/13/2016 0627   LDLCALC 120 (H)  11/13/2016 6045    Additional studies/ records that were reviewed today include: Summarized above    ASSESSMENT & PLAN:   1. CAD - overall she's doing the best I've seen her. Minor chest pains are atypical and palpitations are infrequent (suspect rare ectopy). EKG is normal and recent nuclear stress test reassuring. Continue current regimen. If palpitations increase in frequency, would arrange cardiac monitor. I have encouraged her to participate in cardiac rehab. She never started Imdur. Given h/o migraines and general improvement in functional capacity, will not start at this time. We also discussed importance of f/u with her PCP to discuss control of her anxiety. I believe this makes her hypersensitive to symptoms overall and in turn ends up perpetuating even worse anxiety. We discussed importance of arriving to appointments on time so that we can address all of her concerns. She reports this is due to her social anxiety. She thinks it would be helpful for her to be told her appointment is 20 minutes earlier than it really is. I discussed with nursing staff to make a note of this with scheduling. 2. Essential HTN - generally controlled. Diastolic a little high but given her h/o dizziness, I would follow for now.  3. Shortness of breath - improved, pending further workup by pulmonology. 4. Hyperlipidemia - will have her return for fasting lipids/LFTs.  Disposition: F/u with Dr. Mayford Knife in 4 months. This appointment should be with MD since she has not been back in to see her cardiologist since MI.  Medication Adjustments/Labs and Tests Ordered: Current medicines are reviewed at length with the patient today.  Concerns regarding medicines are outlined above. Medication changes, Labs and Tests ordered today are summarized above and listed in the Patient Instructions accessible in Encounters.   Signed, Laurann Montana, PA-C  01/29/2017 2:04 PM    [facility] [address] [place],  [place], [place]  [address] Phone: [phone]; Fax: [phone]

## 2017-01-29 NOTE — Telephone Encounter (Addendum)
Attempted to call pt to let her know she has clearance to proceed with Cardiac rehab on 8/7 as previously scheduled. Unable to leave message, mailbox is full. Alanson Alyarlette Dinah Lupa RN, BSN Cardiac and Emergency planning/management officerulmonary Rehab Nurse Navigator

## 2017-01-29 NOTE — Patient Instructions (Signed)
Medication Instructions:  Your physician has recommended you make the following change in your medication: 1.) STOP taking the Imdur   Labwork: Your physician recommends that you return for lab work Monday 8/13 for fasting Lipid and LFT   Testing/Procedures: None Ordered   Follow-Up: Your physician recommends that you schedule a follow-up appointment in: 4 months with Dr. Mayford Knifeurner   Any Other Special Instructions Will Be Listed Below (If Applicable).     If you need a refill on your cardiac medications before your next appointment, please call your pharmacy.

## 2017-01-30 ENCOUNTER — Inpatient Hospital Stay (HOSPITAL_COMMUNITY): Admission: RE | Admit: 2017-01-30 | Payer: BLUE CROSS/BLUE SHIELD | Source: Ambulatory Visit

## 2017-01-30 ENCOUNTER — Inpatient Hospital Stay (HOSPITAL_COMMUNITY): Admission: RE | Admit: 2017-01-30 | Payer: BLUE CROSS/BLUE SHIELD | Source: Ambulatory Visit | Admitting: *Deleted

## 2017-01-31 ENCOUNTER — Encounter (HOSPITAL_COMMUNITY): Payer: BLUE CROSS/BLUE SHIELD

## 2017-02-05 ENCOUNTER — Encounter (HOSPITAL_COMMUNITY): Payer: BLUE CROSS/BLUE SHIELD

## 2017-02-05 ENCOUNTER — Other Ambulatory Visit: Payer: BLUE CROSS/BLUE SHIELD

## 2017-02-06 ENCOUNTER — Other Ambulatory Visit: Payer: BLUE CROSS/BLUE SHIELD

## 2017-02-06 ENCOUNTER — Encounter (HOSPITAL_COMMUNITY): Payer: Self-pay

## 2017-02-06 ENCOUNTER — Encounter (HOSPITAL_COMMUNITY)
Admission: RE | Admit: 2017-02-06 | Discharge: 2017-02-06 | Disposition: A | Discharge status: Home / Self Care | Payer: BLUE CROSS/BLUE SHIELD | Source: Ambulatory Visit | Attending: Cardiology | Admitting: Cardiology

## 2017-02-06 VITALS — BP 132/80 | HR 92 | Ht 64.75 in | Wt 226.9 lb

## 2017-02-06 DIAGNOSIS — I10 Essential (primary) hypertension: Secondary | ICD-10-CM

## 2017-02-06 DIAGNOSIS — Z7982 Long term (current) use of aspirin: Secondary | ICD-10-CM | POA: Insufficient documentation

## 2017-02-06 DIAGNOSIS — I2510 Atherosclerotic heart disease of native coronary artery without angina pectoris: Secondary | ICD-10-CM

## 2017-02-06 DIAGNOSIS — M1990 Unspecified osteoarthritis, unspecified site: Secondary | ICD-10-CM | POA: Insufficient documentation

## 2017-02-06 DIAGNOSIS — K219 Gastro-esophageal reflux disease without esophagitis: Secondary | ICD-10-CM | POA: Insufficient documentation

## 2017-02-06 DIAGNOSIS — E785 Hyperlipidemia, unspecified: Secondary | ICD-10-CM

## 2017-02-06 DIAGNOSIS — Z79899 Other long term (current) drug therapy: Secondary | ICD-10-CM | POA: Insufficient documentation

## 2017-02-06 DIAGNOSIS — G4730 Sleep apnea, unspecified: Secondary | ICD-10-CM | POA: Insufficient documentation

## 2017-02-06 DIAGNOSIS — I214 Non-ST elevation (NSTEMI) myocardial infarction: Secondary | ICD-10-CM

## 2017-02-06 DIAGNOSIS — Z7951 Long term (current) use of inhaled steroids: Secondary | ICD-10-CM | POA: Insufficient documentation

## 2017-02-06 DIAGNOSIS — F419 Anxiety disorder, unspecified: Secondary | ICD-10-CM | POA: Insufficient documentation

## 2017-02-06 DIAGNOSIS — I252 Old myocardial infarction: Secondary | ICD-10-CM | POA: Insufficient documentation

## 2017-02-06 DIAGNOSIS — Z7984 Long term (current) use of oral hypoglycemic drugs: Secondary | ICD-10-CM | POA: Diagnosis not present

## 2017-02-06 DIAGNOSIS — E039 Hypothyroidism, unspecified: Secondary | ICD-10-CM | POA: Diagnosis not present

## 2017-02-06 DIAGNOSIS — Z955 Presence of coronary angioplasty implant and graft: Secondary | ICD-10-CM | POA: Insufficient documentation

## 2017-02-06 DIAGNOSIS — E119 Type 2 diabetes mellitus without complications: Secondary | ICD-10-CM | POA: Insufficient documentation

## 2017-02-06 DIAGNOSIS — R0602 Shortness of breath: Secondary | ICD-10-CM

## 2017-02-06 LAB — HEPATIC FUNCTION PANEL
ALBUMIN: 4.3 g/dL (ref 3.5–5.5)
ALK PHOS: 70 IU/L (ref 39–117)
ALT: 8 IU/L (ref 0–32)
AST: 10 IU/L (ref 0–40)
Bilirubin, Direct: 0.06 mg/dL (ref 0.00–0.40)
TOTAL PROTEIN: 6.9 g/dL (ref 6.0–8.5)

## 2017-02-06 LAB — LIPID PANEL
CHOLESTEROL TOTAL: 154 mg/dL (ref 100–199)
Chol/HDL Ratio: 4.1 ratio (ref 0.0–4.4)
HDL: 38 mg/dL — AB (ref >39)
LDL Calculated: 85 mg/dL (ref 0–99)
TRIGLYCERIDES: 155 mg/dL — AB (ref 0–149)
VLDL Cholesterol Cal: 31 mg/dL (ref 5–40)

## 2017-02-06 NOTE — Progress Notes (Signed)
Cardiac Individual Treatment Plan  Patient Details  Name: Cynthia Kirby MRN: [id number] Date of Birth: 1971-04-09 Referring Provider:     CARDIAC REHAB PHASE II ORIENTATION from 02/06/2017 in [facility] CARDIAC REHAB  Referring Provider  Armanda Magic, MD.      Initial Encounter Date:    CARDIAC REHAB PHASE II ORIENTATION from 02/06/2017 in [facility] CARDIAC REHAB  Date  02/06/17  Referring Provider  Armanda Magic, MD.      Visit Diagnosis: 11/12/16 NSTEMI (non-ST elevated myocardial infarction) (HCC)  11/13/16 Status post coronary artery stent placement  Patient's Home Medications on Admission:  Current Outpatient Prescriptions:  .  amLODipine (NORVASC) 10 MG tablet, Take 1 tablet (10 mg total) by mouth daily., Disp: 90 tablet, Rfl: 3 .  aspirin 81 MG chewable tablet, Chew 1 tablet (81 mg total) by mouth daily., Disp: 30 tablet, Rfl: 11 .  fluticasone (FLONASE) 50 MCG/ACT nasal spray, Place 2 sprays into both nostrils daily., Disp: , Rfl:  .  linagliptin (TRADJENTA) 5 MG TABS tablet, Take 1 tablet (5 mg total) by mouth daily., Disp: 30 tablet, Rfl: 5 .  losartan (COZAAR) 100 MG tablet, Take 1 tablet (100 mg total) by mouth daily., Disp: 90 tablet, Rfl: 3 .  metFORMIN (GLUCOPHAGE) 1000 MG tablet, Take 1,000 mg by mouth daily. , Disp: , Rfl:  .  metoprolol succinate (TOPROL-XL) 25 MG 24 hr tablet, Take 50 mg by mouth daily., Disp: , Rfl:  .  nitroGLYCERIN (NITROSTAT) 0.4 MG SL tablet, Place 1 tablet (0.4 mg total) under the tongue every 5 (five) minutes x 3 doses as needed for chest pain., Disp: 25 tablet, Rfl: 3 .  ondansetron (ZOFRAN) 8 MG tablet, Take 1 tablet by mouth every 8 (eight) hours as needed., Disp: , Rfl: 0 .  pantoprazole (PROTONIX) 40 MG tablet, Take 1 tablet by mouth 2 (two) times daily as needed. , Disp: , Rfl: 0 .  rosuvastatin (CRESTOR) 10 MG tablet, Take 1 tablet (10 mg total) by mouth daily., Disp: 90 tablet, Rfl: 3 .  saxagliptin  HCl (ONGLYZA) 2.5 MG TABS tablet, Take 5 mg by mouth daily., Disp: , Rfl:  .  Sodium Chloride-Sodium Bicarb (NETI POT SINUS WASH NA), Place 1 each into the nose as needed (for sinus)., Disp: , Rfl:  .  spironolactone (ALDACTONE) 25 MG tablet, Take 1 tablet (25 mg total) by mouth daily., Disp: 30 tablet, Rfl: 0  Past Medical History: Past Medical History:  Diagnosis Date  . Allergic rhinitis   . Anemia   . Anxiety   . Arthritis    "left ankle; knees" (11/13/2016)  . CAD in native artery 11/12/2016   A. NSTEMI with LHC 11/13/16 RCA 50%, Circ 40%, 2nd OM 40%, 2nd diag 70%, Mid LAD 95%- DES placed. EF normal.  . Depression    "hx; not now" (11/13/2016)  . GERD (gastroesophageal reflux disease)   . Headache    "weekly" (11/13/2016)  . Hyperlipidemia   . Hypertension   . Hypothyroidism   . Migraine    "weekly" (11/13/2016)  . Sleep apnea    "CPAP RX'd; never used" (11/13/2016)  . Type II diabetes mellitus (HCC)     Tobacco Use: History  Smoking Status  . Passive Smoke Exposure - Never Smoker  . Types: Cigarettes  Smokeless Tobacco  . Never Used    Comment: friends and family.     Labs: Recent Review Flowsheet Data    Labs for ITP Cardiac  and Pulmonary Rehab Latest Ref Rng & Units 11/13/2016   Cholestrol 0 - 200 mg/dL 756(E)   LDLCALC 0 - 99 mg/dL 332(R)   HDL >51 mg/dL 88(C)   Trlycerides <166 mg/dL 063(K)   Hemoglobin Z6W 4.8 - 5.6 % 10.5(H)      Capillary Blood Glucose: Lab Results  Component Value Date   GLUCAP 195 (H) 11/14/2016   GLUCAP 168 (H) 11/14/2016   GLUCAP 219 (H) 11/13/2016   GLUCAP 158 (H) 11/13/2016   GLUCAP 193 (H) 11/13/2016     Exercise Target Goals: Date: 02/06/17  Exercise Program Goal: Individual exercise prescription set with THRR, safety & activity barriers. Participant demonstrates ability to understand and report RPE using BORG scale, to self-measure pulse accurately, and to acknowledge the importance of the exercise  prescription.  Exercise Prescription Goal: Starting with aerobic activity 30 plus minutes a day, 3 days per week for initial exercise prescription. Provide home exercise prescription and guidelines that participant acknowledges understanding prior to discharge.  Activity Barriers & Risk Stratification:     Activity Barriers & Cardiac Risk Stratification - 02/06/17 1093      Activity Barriers & Cardiac Risk Stratification   Activity Barriers Muscular Weakness;Deconditioning;Back Problems;Other (comment)   Comments R knee pain and L ankle discomfort   Cardiac Risk Stratification High      6 Minute Walk:     6 Minute Walk    Row Name 02/06/17 1638         6 Minute Walk   Phase Initial     Distance 1572 feet     Walk Time 6 minutes     # of Rest Breaks 0     MPH 2.98     METS 4.82     RPE 11     VO2 Peak 16.87     Symptoms No     Resting HR 92 bpm     Resting BP 132/80     Max Ex. HR 134 bpm     Max Ex. BP 162/78     2 Minute Post BP 140/80        Oxygen Initial Assessment:   Oxygen Re-Evaluation:   Oxygen Discharge (Final Oxygen Re-Evaluation):   Initial Exercise Prescription:     Initial Exercise Prescription - 02/06/17 1600      Date of Initial Exercise RX and Referring Provider   Date 02/06/17   Referring Provider Armanda Magic, MD.     Treadmill   MPH 2.2   Grade 1   Minutes 10   METs 2.99     Bike   Level 1   Minutes 10   METs 2.85     NuStep   Level 3   SPM 85   Minutes 10   METs 2.5     Prescription Details   Frequency (times per week) 3   Duration Progress to 30 minutes of continuous aerobic without signs/symptoms of physical distress     Intensity   THRR 40-80% of Max Heartrate 70-141   Ratings of Perceived Exertion 11-13   Perceived Dyspnea 0-4     Progression   Progression Continue to progress workloads to maintain intensity without signs/symptoms of physical distress.     Resistance Training   Training Prescription  Yes   Weight 2lbs   Reps 10-15      Perform Capillary Blood Glucose checks as needed.  Exercise Prescription Changes:   Exercise Comments:   Exercise Goals and Review:  Exercise Goals    Row Name 02/06/17 0924             Exercise Goals   Increase Physical Activity Yes       Intervention Provide advice, education, support and counseling about physical activity/exercise needs.;Develop an individualized exercise prescription for aerobic and resistive training based on initial evaluation findings, risk stratification, comorbidities and participant's personal goals.       Expected Outcomes Achievement of increased cardiorespiratory fitness and enhanced flexibility, muscular endurance and strength shown through measurements of functional capacity and personal statement of participant.       Increase Strength and Stamina Yes  improve breathing capacity.       Intervention Provide advice, education, support and counseling about physical activity/exercise needs.;Develop an individualized exercise prescription for aerobic and resistive training based on initial evaluation findings, risk stratification, comorbidities and participant's personal goals.       Expected Outcomes Achievement of increased cardiorespiratory fitness and enhanced flexibility, muscular endurance and strength shown through measurements of functional capacity and personal statement of participant.          Exercise Goals Re-Evaluation :    Discharge Exercise Prescription (Final Exercise Prescription Changes):   Nutrition:  Target Goals: Understanding of nutrition guidelines, daily intake of sodium 1500mg , cholesterol 200mg , calories 30% from fat and 7% or less from saturated fats, daily to have 5 or more servings of fruits and vegetables.  Biometrics:     Pre Biometrics - 02/06/17 1642      Pre Biometrics   Height 5' 4.75" (1.645 m)   Weight 226 lb 13.7 oz (102.9 kg)   Waist Circumference 42 inches    Hip Circumference 50 inches   Waist to Hip Ratio 0.84 %   BMI (Calculated) 38.1   Triceps Skinfold 42 mm   % Body Fat 47.2 %   Grip Strength 37 kg   Flexibility 12.5 in   Single Leg Stand 8 seconds       Nutrition Therapy Plan and Nutrition Goals:     Nutrition Therapy & Goals - 02/06/17 1450      Nutrition Therapy   Diet Carb Modified, Therapeutic Lifestyle changes     Personal Nutrition Goals   Nutrition Goal Pt to identify food quantities necessary to achieve weight loss of 6-24 lb (2.7-10.9 kg) at graduation from cardiac rehab. Initial goal wt of 199 lb desired.    Personal Goal #2 Pt to improve snack choices and portion sizes consumed.   Personal Goal #3 Improved glycemic control as evidenced by an improvement in A1c from 10.5 to less than 7.0     Intervention Plan   Intervention Prescribe, educate and counsel regarding individualized specific dietary modifications aiming towards targeted core components such as weight, hypertension, lipid management, diabetes, heart failure and other comorbidities.   Expected Outcomes Short Term Goal: Understand basic principles of dietary content, such as calories, fat, sodium, cholesterol and nutrients.;Long Term Goal: Adherence to prescribed nutrition plan.      Nutrition Discharge: Nutrition Scores:     Nutrition Assessments - 02/06/17 1452      MEDFICTS Scores   Pre Score 24      Nutrition Goals Re-Evaluation:   Nutrition Goals Re-Evaluation:   Nutrition Goals Discharge (Final Nutrition Goals Re-Evaluation):   Psychosocial: Target Goals: Acknowledge presence or absence of significant depression and/or stress, maximize coping skills, provide positive support system. Participant is able to verbalize types and ability to use techniques and skills needed for reducing stress  and depression.  Initial Review & Psychosocial Screening:     Initial Psych Review & Screening - 02/06/17 1700      Initial Review   Current  issues with Current Anxiety/Panic;Current Stress Concerns   Comments recent illness has effected her self confidence.  Pt is earger to learn more about a heart healthy lifestyle     Family Dynamics   Good Support System? Yes     Barriers   Psychosocial barriers to participate in program The patient should benefit from training in stress management and relaxation.     Screening Interventions   Interventions Encouraged to exercise      Quality of Life Scores:     Quality of Life - 02/06/17 1644      Quality of Life Scores   Health/Function Pre 14.3 %   Socioeconomic Pre 23.57 %   Psych/Spiritual Pre 21.64 %   Family Pre 24 %   GLOBAL Pre 19 %      PHQ-9: Recent Review Flowsheet Data    There is no flowsheet data to display.     Interpretation of Total Score  Total Score Depression Severity:  1-4 = Minimal depression, 5-9 = Mild depression, 10-14 = Moderate depression, 15-19 = Moderately severe depression, 20-27 = Severe depression   Psychosocial Evaluation and Intervention:   Psychosocial Re-Evaluation:   Psychosocial Discharge (Final Psychosocial Re-Evaluation):   Vocational Rehabilitation: Provide vocational rehab assistance to qualifying candidates.   Vocational Rehab Evaluation & Intervention:     Vocational Rehab - 02/06/17 1704      Initial Vocational Rehab Evaluation & Intervention   Assessment shows need for Vocational Rehabilitation No      Education: Education Goals: Education classes will be provided on a weekly basis, covering required topics. Participant will state understanding/return demonstration of topics presented.  Learning Barriers/Preferences:     Learning Barriers/Preferences - 02/06/17 1610      Learning Barriers/Preferences   Learning Barriers Sight   Learning Preferences Skilled Demonstration;Verbal Instruction;Written Material;Video;Pictoral      Education Topics: Count Your Pulse:  -Group instruction provided by  verbal instruction, demonstration, patient participation and written materials to support subject.  Instructors address importance of being able to find your pulse and how to count your pulse when at home without a heart monitor.  Patients get hands on experience counting their pulse with staff help and individually.   Heart Attack, Angina, and Risk Factor Modification:  -Group instruction provided by verbal instruction, video, and written materials to support subject.  Instructors address signs and symptoms of angina and heart attacks.    Also discuss risk factors for heart disease and how to make changes to improve heart health risk factors.   Functional Fitness:  -Group instruction provided by verbal instruction, demonstration, patient participation, and written materials to support subject.  Instructors address safety measures for doing things around the house.  Discuss how to get up and down off the floor, how to pick things up properly, how to safely get out of a chair without assistance, and balance training.   Meditation and Mindfulness:  -Group instruction provided by verbal instruction, patient participation, and written materials to support subject.  Instructor addresses importance of mindfulness and meditation practice to help reduce stress and improve awareness.  Instructor also leads participants through a meditation exercise.    Stretching for Flexibility and Mobility:  -Group instruction provided by verbal instruction, patient participation, and written materials to support subject.  Instructors lead participants through series of  stretches that are designed to increase flexibility thus improving mobility.  These stretches are additional exercise for major muscle groups that are typically performed during regular warm up and cool down.   Hands Only CPR:  -Group verbal, video, and participation provides a basic overview of AHA guidelines for community CPR. Role-play of emergencies  allow participants the opportunity to practice calling for help and chest compression technique with discussion of AED use.   Hypertension: -Group verbal and written instruction that provides a basic overview of hypertension including the most recent diagnostic guidelines, risk factor reduction with self-care instructions and medication management.    Nutrition I class: Heart Healthy Eating:  -Group instruction provided by PowerPoint slides, verbal discussion, and written materials to support subject matter. The instructor gives an explanation and review of the Therapeutic Lifestyle Changes diet recommendations, which includes a discussion on lipid goals, dietary fat, sodium, fiber, plant stanol/sterol esters, sugar, and the components of a well-balanced, healthy diet.   Nutrition II class: Lifestyle Skills:  -Group instruction provided by PowerPoint slides, verbal discussion, and written materials to support subject matter. The instructor gives an explanation and review of label reading, grocery shopping for heart health, heart healthy recipe modifications, and ways to make healthier choices when eating out.   Diabetes Question & Answer:  -Group instruction provided by PowerPoint slides, verbal discussion, and written materials to support subject matter. The instructor gives an explanation and review of diabetes co-morbidities, pre- and post-prandial blood glucose goals, pre-exercise blood glucose goals, signs, symptoms, and treatment of hypoglycemia and hyperglycemia, and foot care basics.   Diabetes Blitz:  -Group instruction provided by PowerPoint slides, verbal discussion, and written materials to support subject matter. The instructor gives an explanation and review of the physiology behind type 1 and type 2 diabetes, diabetes medications and rational behind using different medications, pre- and post-prandial blood glucose recommendations and Hemoglobin A1c goals, diabetes diet, and  exercise including blood glucose guidelines for exercising safely.    Portion Distortion:  -Group instruction provided by PowerPoint slides, verbal discussion, written materials, and food models to support subject matter. The instructor gives an explanation of serving size versus portion size, changes in portions sizes over the last 20 years, and what consists of a serving from each food group.   Stress Management:  -Group instruction provided by verbal instruction, video, and written materials to support subject matter.  Instructors review role of stress in heart disease and how to cope with stress positively.     Exercising on Your Own:  -Group instruction provided by verbal instruction, power point, and written materials to support subject.  Instructors discuss benefits of exercise, components of exercise, frequency and intensity of exercise, and end points for exercise.  Also discuss use of nitroglycerin and activating EMS.  Review options of places to exercise outside of rehab.  Review guidelines for sex with heart disease.   Cardiac Drugs I:  -Group instruction provided by verbal instruction and written materials to support subject.  Instructor reviews cardiac drug classes: antiplatelets, anticoagulants, beta blockers, and statins.  Instructor discusses reasons, side effects, and lifestyle considerations for each drug class.   Cardiac Drugs II:  -Group instruction provided by verbal instruction and written materials to support subject.  Instructor reviews cardiac drug classes: angiotensin converting enzyme inhibitors (ACE-I), angiotensin II receptor blockers (ARBs), nitrates, and calcium channel blockers.  Instructor discusses reasons, side effects, and lifestyle considerations for each drug class.   Anatomy and Physiology of the Circulatory System:  Group verbal and written instruction and models provide basic cardiac anatomy and physiology, with the coronary electrical and arterial  systems. Review of: AMI, Angina, Valve disease, Heart Failure, Peripheral Artery Disease, Cardiac Arrhythmia, Pacemakers, and the ICD.   Other Education:  -Group or individual verbal, written, or video instructions that support the educational goals of the cardiac rehab program.   Knowledge Questionnaire Score:     Knowledge Questionnaire Score - 02/06/17 1644      Knowledge Questionnaire Score   Pre Score 20/28      Core Components/Risk Factors/Patient Goals at Admission:     Personal Goals and Risk Factors at Admission - 02/06/17 0925      Core Components/Risk Factors/Patient Goals on Admission    Weight Management Yes;Weight Maintenance;Weight Loss;Obesity   Intervention Weight Management: Develop a combined nutrition and exercise program designed to reach desired caloric intake, while maintaining appropriate intake of nutrient and fiber, sodium and fats, and appropriate energy expenditure required for the weight goal.;Weight Management: Provide education and appropriate resources to help participant work on and attain dietary goals.;Weight Management/Obesity: Establish reasonable short term and long term weight goals.;Obesity: Provide education and appropriate resources to help participant work on and attain dietary goals.   Admit Weight 226 lb 13.7 oz (102.9 kg)   Goal Weight: Short Term 216 lb (98 kg)   Goal Weight: Long Term 199 lb (90.3 kg)   Expected Outcomes Short Term: Continue to assess and modify interventions until short term weight is achieved;Long Term: Adherence to nutrition and physical activity/exercise program aimed toward attainment of established weight goal;Weight Maintenance: Understanding of the daily nutrition guidelines, which includes 25-35% calories from fat, 7% or less cal from saturated fats, less than 200mg  cholesterol, less than 1.5gm of sodium, & 5 or more servings of fruits and vegetables daily;Weight Loss: Understanding of general recommendations for a  balanced deficit meal plan, which promotes 1-2 lb weight loss per week and includes a negative energy balance of [phone] kcal/d;Understanding recommendations for meals to include 15-35% energy as protein, 25-35% energy from fat, 35-60% energy from carbohydrates, less than 200mg  of dietary cholesterol, 20-35 gm of total fiber daily;Understanding of distribution of calorie intake throughout the day with the consumption of 4-5 meals/snacks   Diabetes Yes   Intervention Provide education about signs/symptoms and action to take for hypo/hyperglycemia.;Provide education about proper nutrition, including hydration, and aerobic/resistive exercise prescription along with prescribed medications to achieve blood glucose in normal ranges: Fasting glucose 65-99 mg/dL   Expected Outcomes Long Term: Attainment of HbA1C < 7%.;Short Term: Participant verbalizes understanding of the signs/symptoms and immediate care of hyper/hypoglycemia, proper foot care and importance of medication, aerobic/resistive exercise and nutrition plan for blood glucose control.   Stress Yes   Intervention Offer individual and/or small group education and counseling on adjustment to heart disease, stress management and health-related lifestyle change. Teach and support self-help strategies.;Refer participants experiencing significant psychosocial distress to appropriate mental health specialists for further evaluation and treatment. When possible, include family members and significant others in education/counseling sessions.   Expected Outcomes Short Term: Participant demonstrates changes in health-related behavior, relaxation and other stress management skills, ability to obtain effective social support, and compliance with psychotropic medications if prescribed.;Long Term: Emotional wellbeing is indicated by absence of clinically significant psychosocial distress or social isolation.      Core Components/Risk Factors/Patient Goals Review:     Core Components/Risk Factors/Patient Goals at Discharge (Final Review):    ITP Comments:     ITP  Comments    Row Name 02/06/17 0920           ITP Comments Medical Director, Dr. Armanda Magic          Comments: Patient attended orientation from 0900 to 1100 to review rules and guidelines for program. Completed 6 minute walk test, Intitial ITP, and exercise prescription.  VSS. Telemetry-SR with sinus arrythmias.  Asymptomatic. Brief psychosocial assessment shows high level of stress and anxiety related to recent unexpected cardiac related incident.  Pt remains hopeful that participating in cardiac rehab will help build her confidence.  Pt is looking forward to participating in cardiac rehab. Alanson Aly, BSN Cardiac and Emergency planning/management officer

## 2017-02-06 NOTE — Progress Notes (Signed)
Cynthia Kirby 46 y.o. female DOB: 08-30-1970 MRN: [id number]      Nutrition Note  1. 11/12/16 NSTEMI (non-ST elevated myocardial infarction) (HCC)   2. 11/13/16 Status post coronary artery stent placement    Past Medical History:  Diagnosis Date  . Allergic rhinitis   . Anemia   . Anxiety   . Arthritis    "left ankle; knees" (11/13/2016)  . CAD in native artery 11/12/2016   A. NSTEMI with LHC 11/13/16 RCA 50%, Circ 40%, 2nd OM 40%, 2nd diag 70%, Mid LAD 95%- DES placed. EF normal.  . Depression    "hx; not now" (11/13/2016)  . GERD (gastroesophageal reflux disease)   . Headache    "weekly" (11/13/2016)  . Hyperlipidemia   . Hypertension   . Hypothyroidism   . Migraine    "weekly" (11/13/2016)  . Sleep apnea    "CPAP RX'd; never used" (11/13/2016)  . Type II diabetes mellitus (HCC)    Meds reviewed. Tradjenta, Metformin, Onglyza noted  HT: Ht Readings from Last 1 Encounters:  01/29/17 5\' 5"  (1.651 m)    WT: Wt Readings from Last 3 Encounters:  01/29/17 228 lb 6.4 oz (103.6 kg)  01/02/17 224 lb (101.6 kg)  12/12/16 224 lb (101.6 kg)     BMI 38.1   Current tobacco use? No  Labs:  Lipid Panel     Component Value Date/Time   CHOL 208 (H) 11/13/2016 0627   TRIG 244 (H) 11/13/2016 0627   HDL 39 (L) 11/13/2016 0627   CHOLHDL 5.3 11/13/2016 0627   VLDL 49 (H) 11/13/2016 0627   LDLCALC 120 (H) 11/13/2016 [id number]    Lab Results  Component Value Date   HGBA1C 10.5 (H) 11/13/2016   CBG (last 3)  No results for input(s): GLUCAP in the last 72 hours.  Nutrition Note Spoke with pt. Nutrition plan and goals reviewed with pt. Pt is following Step 2 of the Therapeutic Lifestyle Changes diet. Pt is a lacto-ovo-pescatarian and is working toward becoming vegan. Pt reports she is mostly pescatarian and only occasionally eats eggs/cheese. Pt wants to lose wt. Pt reports her highest wt was 275 lb "years ago." Pt is diabetic. Last A1c indicates blood glucose poorly controlled. Pt eats out  frequently. Pt expressed understanding of the information reviewed. Pt aware of nutrition education classes offered and plans on attending nutrition classes.  Nutrition Diagnosis ? Food-and nutrition-related knowledge deficit related to lack of exposure to information as related to diagnosis of: ? CVD ? DM ? Obesity related to excessive energy intake as evidenced by a BMI of 38.1  Nutrition Intervention ? Pt's individual nutrition plan and goals reviewed with pt.  Nutrition Goal(s):  ? Pt to identify food quantities necessary to achieve weight loss of 6-24 lb (2.7-10.9 kg) at graduation from cardiac rehab. Initial goal wt of 199 lb desired.  ? Pt to improve snack choices and portion sizes consumed. ? Improved glycemic control as evidenced by an improvement in A1c from 10.5 to less than 7.0  Plan:  Pt to attend nutrition classes ? Nutrition I ? Nutrition II ? Portion Distortion ? Diabetes Blitz ? Diabetes Q & A Will provide client-centered nutrition education as part of interdisciplinary care.   Monitor and evaluate progress toward nutrition goal with team.  [name], M.Ed, RD, LDN, CDE 02/06/2017 2:32 PM

## 2017-02-07 ENCOUNTER — Encounter (HOSPITAL_COMMUNITY): Payer: BLUE CROSS/BLUE SHIELD

## 2017-02-07 ENCOUNTER — Telehealth: Payer: Self-pay

## 2017-02-07 DIAGNOSIS — E785 Hyperlipidemia, unspecified: Secondary | ICD-10-CM

## 2017-02-07 NOTE — Telephone Encounter (Signed)
Informed patient of results and verbal understanding expressed.  Patient has not been taking her statin every night as it sometimes makes her nausous. Encouraged her to take daily on a full stomach. Repeat FLP and ALT scheduled 10/15. She will call if nausea is a continued problem. Patient agrees with treatment plan.

## 2017-02-07 NOTE — Telephone Encounter (Signed)
-----   Message from [name], MD sent at 02/06/2017  6:29 PM EDT ----- Increase Crestor to 20mg  daily and repeat FLP and ALT in 6 weeks

## 2017-02-09 ENCOUNTER — Telehealth: Payer: Self-pay | Admitting: Pulmonary Disease

## 2017-02-09 NOTE — Telephone Encounter (Signed)
ATC pt-unable to leave message due to mailbox being full. Will call back.

## 2017-02-09 NOTE — Telephone Encounter (Signed)
Left another message for patient to call back 

## 2017-02-09 NOTE — Telephone Encounter (Signed)
ATC- unable to leave vm due to mailbox being full Will call back 

## 2017-02-09 NOTE — Telephone Encounter (Signed)
Patient returned call, CB is [phone]

## 2017-02-09 NOTE — Telephone Encounter (Signed)
Patient is returning phone call  [phone]. Patient request to leave detailed message on voicemail.

## 2017-02-12 ENCOUNTER — Encounter (HOSPITAL_COMMUNITY)
Admission: RE | Admit: 2017-02-12 | Discharge: 2017-02-12 | Disposition: A | Discharge status: Home / Self Care | Payer: BLUE CROSS/BLUE SHIELD | Source: Ambulatory Visit | Attending: Cardiology | Admitting: Cardiology

## 2017-02-12 DIAGNOSIS — I252 Old myocardial infarction: Secondary | ICD-10-CM | POA: Diagnosis not present

## 2017-02-12 DIAGNOSIS — I214 Non-ST elevation (NSTEMI) myocardial infarction: Secondary | ICD-10-CM

## 2017-02-12 DIAGNOSIS — Z955 Presence of coronary angioplasty implant and graft: Secondary | ICD-10-CM

## 2017-02-12 LAB — GLUCOSE, CAPILLARY
GLUCOSE-CAPILLARY: 258 mg/dL — AB (ref 65–99)
Glucose-Capillary: 241 mg/dL — ABNORMAL HIGH (ref 65–99)

## 2017-02-12 NOTE — Telephone Encounter (Signed)
Spoke with pt. She is aware of JN's response. Nothing further was needed. 

## 2017-02-12 NOTE — Progress Notes (Signed)
Daily Session Note  Patient Details  Name: Caelen Higinbotham MRN: [id number] Date of Birth: 1971-06-15 Referring Provider:     CARDIAC REHAB PHASE II ORIENTATION from 02/06/2017 in [place]  Referring Provider  Fransico Him, MD.      Encounter Date: 02/12/2017  Check In:     Session Check In - 02/12/17 1415      Check-In   Location [facility]   Staff Present Andi Hence, RN, Marga Melnick, RN, [facility] diVincenzo, MS, ACSM RCEP, Exercise Physiologist;Olinty Celesta Aver, MS, ACSM CEP, Exercise Physiologist;Other   Supervising physician immediately available to respond to emergencies [facility] immediately available   Physician(s) Dr. Wendee Beavers   Medication changes reported     No   Fall or balance concerns reported    No   Tobacco Cessation No Change   Warm-up and Cool-down Performed as group-led instruction   Resistance Training Performed Yes   VAD Patient? No     Pain Assessment   Currently in Pain? No/denies      Capillary Blood Glucose: Results for orders placed or performed during the hospital encounter of 02/12/17 (from the past 24 hour(s))  Glucose, capillary     Status: Abnormal   Collection Time: 02/12/17  1:41 PM  Result Value Ref Range   Glucose-Capillary 241 (H) 65 - 99 mg/dL  Glucose, capillary     Status: Abnormal   Collection Time: 02/12/17  2:33 PM  Result Value Ref Range   Glucose-Capillary 258 (H) 65 - 99 mg/dL      History  Smoking Status  . Passive Smoke Exposure - Never Smoker  . Types: Cigarettes  Smokeless Tobacco  . Never Used    Comment: friends and family.     Goals Met:  Exercise tolerated well  Goals Unmet:  Not Applicable  Comments: Pt started cardiac rehab today.  Pt tolerated light exercise without difficulty. VSS, telemetry-sinus rhythm, asymptomatic.  Medication list reconciled. Pt denies barriers to medicaiton compliance.  PSYCHOSOCIAL ASSESSMENT:  PHQ-6 . Pt exhibits  positive coping skills, hopeful outlook with supportive family. No psychosocial needs identified at this time, no psychosocial interventions necessary.    Pt enjoys music. 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  Pt goals for cardiac rehab are weight loss and increased strength/stamina.     Pt oriented to exercise equipment and routine.    Understanding verbalized.   Dr. Fransico Him is Medical Director for Cardiac Rehab at [facility].

## 2017-02-12 NOTE — Telephone Encounter (Signed)
Not sure what their walk was done with a forehead pulse oximetry probe and I don't see documentation of saturation with walk either. So she should still plan on doing a walk for me please.

## 2017-02-12 NOTE — Telephone Encounter (Signed)
Called and spoke with pt. Pt states she had a SMW at pulm rehab on 02/06/17. Pt is scheduled for SMW on 02/13/17 at our office. Pt is wanting to know if she should have her SMW tomorrow. SMW that was preformed on 8/14 can be found under "notes" tab.   JN please advise. Thanks.

## 2017-02-13 ENCOUNTER — Ambulatory Visit (INDEPENDENT_AMBULATORY_CARE_PROVIDER_SITE_OTHER): Payer: BLUE CROSS/BLUE SHIELD | Admitting: *Deleted

## 2017-02-13 DIAGNOSIS — R0609 Other forms of dyspnea: Secondary | ICD-10-CM | POA: Diagnosis not present

## 2017-02-13 NOTE — Progress Notes (Signed)
SIX MIN WALK 02/13/2017  Medications no medications taken todays  Supplimental Oxygen during Test? (L/min) No  Laps 8  Partial Lap (in Meters) 24  Baseline BP (sitting) 138/88  Baseline Heartrate 84  Baseline Dyspnea (Borg Scale) 3  Baseline Fatigue (Borg Scale) 4  Baseline SPO2 100  BP (sitting) 160/100  Heartrate 111  Dyspnea (Borg Scale) 4  Fatigue (Borg Scale) 4  SPO2 100  BP (sitting) 156/92  Heartrate 90  SPO2 100  Interpretation (No Data)  Distance Completed 408  Tech Comments: test performed with forehead probe. pt completed test at moderate pace with complaints of chest discomfort with deep breathing during test. per pt discomfort subsided within post. no desats.

## 2017-02-14 ENCOUNTER — Encounter (HOSPITAL_COMMUNITY)
Admission: RE | Admit: 2017-02-14 | Discharge: 2017-02-14 | Disposition: A | Discharge status: Home / Self Care | Payer: BLUE CROSS/BLUE SHIELD | Source: Ambulatory Visit | Attending: Cardiology | Admitting: Cardiology

## 2017-02-14 DIAGNOSIS — I214 Non-ST elevation (NSTEMI) myocardial infarction: Secondary | ICD-10-CM

## 2017-02-14 DIAGNOSIS — Z955 Presence of coronary angioplasty implant and graft: Secondary | ICD-10-CM

## 2017-02-14 DIAGNOSIS — I252 Old myocardial infarction: Secondary | ICD-10-CM | POA: Diagnosis not present

## 2017-02-14 LAB — GLUCOSE, CAPILLARY: GLUCOSE-CAPILLARY: 246 mg/dL — AB (ref 65–99)

## 2017-02-19 ENCOUNTER — Encounter (HOSPITAL_COMMUNITY)
Admission: RE | Admit: 2017-02-19 | Discharge: 2017-02-19 | Disposition: A | Discharge status: Home / Self Care | Payer: BLUE CROSS/BLUE SHIELD | Source: Ambulatory Visit | Attending: Cardiology | Admitting: Cardiology

## 2017-02-19 DIAGNOSIS — Z955 Presence of coronary angioplasty implant and graft: Secondary | ICD-10-CM

## 2017-02-19 DIAGNOSIS — I252 Old myocardial infarction: Secondary | ICD-10-CM | POA: Diagnosis not present

## 2017-02-19 DIAGNOSIS — I214 Non-ST elevation (NSTEMI) myocardial infarction: Secondary | ICD-10-CM

## 2017-02-19 LAB — GLUCOSE, CAPILLARY: GLUCOSE-CAPILLARY: 190 mg/dL — AB (ref 65–99)

## 2017-02-21 ENCOUNTER — Encounter (HOSPITAL_COMMUNITY)
Admission: RE | Admit: 2017-02-21 | Discharge: 2017-02-21 | Disposition: A | Discharge status: Home / Self Care | Payer: BLUE CROSS/BLUE SHIELD | Source: Ambulatory Visit | Attending: Cardiology | Admitting: Cardiology

## 2017-02-21 DIAGNOSIS — I214 Non-ST elevation (NSTEMI) myocardial infarction: Secondary | ICD-10-CM

## 2017-02-21 DIAGNOSIS — I252 Old myocardial infarction: Secondary | ICD-10-CM | POA: Diagnosis not present

## 2017-02-21 DIAGNOSIS — Z955 Presence of coronary angioplasty implant and graft: Secondary | ICD-10-CM

## 2017-02-21 LAB — GLUCOSE, CAPILLARY: GLUCOSE-CAPILLARY: 284 mg/dL — AB (ref 65–99)

## 2017-02-28 ENCOUNTER — Encounter (HOSPITAL_COMMUNITY): Payer: BLUE CROSS/BLUE SHIELD

## 2017-02-28 ENCOUNTER — Other Ambulatory Visit: Payer: Self-pay | Admitting: Cardiology

## 2017-03-02 ENCOUNTER — Ambulatory Visit: Payer: BLUE CROSS/BLUE SHIELD | Admitting: Pulmonary Disease

## 2017-03-05 ENCOUNTER — Encounter (HOSPITAL_COMMUNITY)
Admission: RE | Admit: 2017-03-05 | Discharge: 2017-03-05 | Disposition: A | Discharge status: Home / Self Care | Payer: BLUE CROSS/BLUE SHIELD | Source: Ambulatory Visit | Attending: Cardiology | Admitting: Cardiology

## 2017-03-05 DIAGNOSIS — Z7982 Long term (current) use of aspirin: Secondary | ICD-10-CM | POA: Diagnosis not present

## 2017-03-05 DIAGNOSIS — I252 Old myocardial infarction: Secondary | ICD-10-CM | POA: Insufficient documentation

## 2017-03-05 DIAGNOSIS — F419 Anxiety disorder, unspecified: Secondary | ICD-10-CM | POA: Insufficient documentation

## 2017-03-05 DIAGNOSIS — E119 Type 2 diabetes mellitus without complications: Secondary | ICD-10-CM | POA: Diagnosis not present

## 2017-03-05 DIAGNOSIS — Z79899 Other long term (current) drug therapy: Secondary | ICD-10-CM | POA: Insufficient documentation

## 2017-03-05 DIAGNOSIS — K219 Gastro-esophageal reflux disease without esophagitis: Secondary | ICD-10-CM | POA: Diagnosis not present

## 2017-03-05 DIAGNOSIS — I2510 Atherosclerotic heart disease of native coronary artery without angina pectoris: Secondary | ICD-10-CM | POA: Diagnosis not present

## 2017-03-05 DIAGNOSIS — Z7984 Long term (current) use of oral hypoglycemic drugs: Secondary | ICD-10-CM | POA: Diagnosis not present

## 2017-03-05 DIAGNOSIS — Z955 Presence of coronary angioplasty implant and graft: Secondary | ICD-10-CM | POA: Insufficient documentation

## 2017-03-05 DIAGNOSIS — E785 Hyperlipidemia, unspecified: Secondary | ICD-10-CM | POA: Insufficient documentation

## 2017-03-05 DIAGNOSIS — I10 Essential (primary) hypertension: Secondary | ICD-10-CM | POA: Diagnosis not present

## 2017-03-05 DIAGNOSIS — I214 Non-ST elevation (NSTEMI) myocardial infarction: Secondary | ICD-10-CM

## 2017-03-05 DIAGNOSIS — M1990 Unspecified osteoarthritis, unspecified site: Secondary | ICD-10-CM | POA: Insufficient documentation

## 2017-03-05 DIAGNOSIS — G4730 Sleep apnea, unspecified: Secondary | ICD-10-CM | POA: Insufficient documentation

## 2017-03-05 DIAGNOSIS — Z7951 Long term (current) use of inhaled steroids: Secondary | ICD-10-CM | POA: Insufficient documentation

## 2017-03-05 DIAGNOSIS — E039 Hypothyroidism, unspecified: Secondary | ICD-10-CM | POA: Insufficient documentation

## 2017-03-05 LAB — GLUCOSE, CAPILLARY: GLUCOSE-CAPILLARY: 260 mg/dL — AB (ref 65–99)

## 2017-03-05 NOTE — Progress Notes (Signed)
Cynthia Kirby 46 y.o. female DOB: 1971-01-07 MRN: [id number]      Nutrition Note Dx: NSTEMI, s/p DES LAD Note Spoke with pt. Nutrition plan and survey reviewed with pt. Pt is a lacto-ovo-pescatarian following Step 2 of the Therapeutic Lifestyle Changes diet. Pt wants to lose wt and is diabetic. Last A1c indicates blood glucose poorly controlled. Per discussion, pt checks CBG's "at least once a day." Barriers to wt loss and DM control include pt is an emotional eater and eats out frequently. Ways to manage emotional eating discussed (e.g. Mindful Eating meditatiton). Pt expressed understanding of the information reviewed. Pt aware of nutrition education classes offered.  Nutrition Diagnosis ? Food-and nutrition-related knowledge deficit related to lack of exposure to information as related to diagnosis of: ? CVD ? DM ? Obesity related to excessive energy intake as evidenced by a BMI of 38.1  Nutrition Intervention ? Pt's individual nutrition plan and goals reviewed with pt. ? Handouts given for:  ? Nutrition I ? Nutrition II ? Diabetes Blitz  Nutrition Goal(s):  ? Pt to identify food quantities necessary to achieve weight loss of 6-24 lb (2.7-10.9 kg) at graduation from cardiac rehab. Initial goal wt of 199 lb desired.  ? Pt to improve snack choices and portion sizes consumed. ? Improved glycemic control as evidenced by an improvement in A1c from 10.5 to less than 7.0  Plan:  Pt to attend nutrition classes ? Nutrition I ? Nutrition II ? Portion Distortion ? Diabetes Blitz ? Diabetes Q & A Will provide client-centered nutrition education as part of interdisciplinary care.   Monitor and evaluate progress toward nutrition goal with team.  Mickle PlumbEdna Amylynn Fano, M.Ed, RD, LDN, CDE 03/05/2017 2:24 PM

## 2017-03-07 ENCOUNTER — Encounter (HOSPITAL_COMMUNITY)
Admission: RE | Admit: 2017-03-07 | Discharge: 2017-03-07 | Disposition: A | Discharge status: Home / Self Care | Payer: BLUE CROSS/BLUE SHIELD | Source: Ambulatory Visit | Attending: Cardiology | Admitting: Cardiology

## 2017-03-07 DIAGNOSIS — I214 Non-ST elevation (NSTEMI) myocardial infarction: Secondary | ICD-10-CM

## 2017-03-07 DIAGNOSIS — I252 Old myocardial infarction: Secondary | ICD-10-CM | POA: Diagnosis not present

## 2017-03-07 DIAGNOSIS — Z955 Presence of coronary angioplasty implant and graft: Secondary | ICD-10-CM

## 2017-03-07 LAB — GLUCOSE, CAPILLARY: Glucose-Capillary: 220 mg/dL — ABNORMAL HIGH (ref 65–99)

## 2017-03-07 NOTE — Progress Notes (Signed)
Reviewed home exercise with pt today.  Pt plans to walk for exercise, goal or target to get 5,000-7,000 steps per day.Reviewed THR, pulse, RPE, sign and symptoms, NTG use, and when to call 911 or MD.  Also discussed weather considerations and indoor options.  Pt voiced understanding.    Warrick ParisianAmber Khamani Daniely, MS, ACSM [employer] CorporationCEP

## 2017-03-07 NOTE — Progress Notes (Signed)
Cardiac Individual Treatment Plan  Patient Details  Name: Cynthia Kirby MRN: [id number] Date of Birth: 1970/07/10 Referring Provider:     CARDIAC REHAB PHASE II ORIENTATION from 02/06/2017 in [facility] CARDIAC REHAB  Referring Provider  Armanda Magic, MD.      Initial Encounter Date:    CARDIAC REHAB PHASE II ORIENTATION from 02/06/2017 in [facility] CARDIAC REHAB  Date  02/06/17  Referring Provider  Armanda Magic, MD.      Visit Diagnosis: 11/12/16 NSTEMI (non-ST elevated myocardial infarction) (HCC)  11/13/16 Status post coronary artery stent placement  Patient's Home Medications on Admission:  Current Outpatient Prescriptions:  .  amLODipine (NORVASC) 10 MG tablet, Take 1 tablet (10 mg total) by mouth daily., Disp: 90 tablet, Rfl: 3 .  aspirin 81 MG chewable tablet, Chew 1 tablet (81 mg total) by mouth daily., Disp: 30 tablet, Rfl: 11 .  fluticasone (FLONASE) 50 MCG/ACT nasal spray, Place 2 sprays into both nostrils daily., Disp: , Rfl:  .  linagliptin (TRADJENTA) 5 MG TABS tablet, Take 1 tablet (5 mg total) by mouth daily. (Patient not taking: Reported on 02/12/2017), Disp: 30 tablet, Rfl: 5 .  losartan (COZAAR) 100 MG tablet, Take 1 tablet (100 mg total) by mouth daily., Disp: 90 tablet, Rfl: 3 .  metFORMIN (GLUCOPHAGE) 1000 MG tablet, Take 1,000 mg by mouth 2 (two) times daily with a meal. , Disp: , Rfl:  .  metoprolol succinate (TOPROL-XL) 50 MG 24 hr tablet, Take 50 mg by mouth daily. Take with or immediately following a meal., Disp: , Rfl:  .  nitroGLYCERIN (NITROSTAT) 0.4 MG SL tablet, Place 1 tablet (0.4 mg total) under the tongue every 5 (five) minutes x 3 doses as needed for chest pain., Disp: 25 tablet, Rfl: 3 .  ondansetron (ZOFRAN) 8 MG tablet, Take 1 tablet by mouth every 8 (eight) hours as needed., Disp: , Rfl: 0 .  pantoprazole (PROTONIX) 40 MG tablet, Take 1 tablet by mouth 2 (two) times daily as needed. , Disp: , Rfl: 0 .   rosuvastatin (CRESTOR) 10 MG tablet, Take 1 tablet (10 mg total) by mouth daily., Disp: 90 tablet, Rfl: 3 .  saxagliptin HCl (ONGLYZA) 2.5 MG TABS tablet, Take 5 mg by mouth daily., Disp: , Rfl:  .  Sodium Chloride-Sodium Bicarb (NETI POT SINUS WASH NA), Place 1 each into the nose as needed (for sinus)., Disp: , Rfl:  .  spironolactone (ALDACTONE) 25 MG tablet, Take 1 tablet (25 mg total) by mouth daily., Disp: 90 tablet, Rfl: 3  Past Medical History: Past Medical History:  Diagnosis Date  . Allergic rhinitis   . Anemia   . Anxiety   . Arthritis    "left ankle; knees" (11/13/2016)  . CAD in native artery 11/12/2016   A. NSTEMI with LHC 11/13/16 RCA 50%, Circ 40%, 2nd OM 40%, 2nd diag 70%, Mid LAD 95%- DES placed. EF normal.  . Depression    "hx; not now" (11/13/2016)  . GERD (gastroesophageal reflux disease)   . Headache    "weekly" (11/13/2016)  . Hyperlipidemia   . Hypertension   . Hypothyroidism   . Migraine    "weekly" (11/13/2016)  . Sleep apnea    "CPAP RX'd; never used" (11/13/2016)  . Type II diabetes mellitus (HCC)     Tobacco Use: History  Smoking Status  . Passive Smoke Exposure - Never Smoker  . Types: Cigarettes  Smokeless Tobacco  . Never Used    Comment:  friends and family.     Labs: Recent Review Flowsheet Data    Labs for ITP Cardiac and Pulmonary Rehab Latest Ref Rng & Units 11/13/2016 02/06/2017   Cholestrol 100 - 199 mg/dL 696(E) 952   LDLCALC 0 - 99 mg/dL 841(L) 85   HDL >24 mg/dL 40(N) 02(V)   Trlycerides 0 - 149 mg/dL 253(G) 644(I)   Hemoglobin A1c 4.8 - 5.6 % 10.5(H) -      Capillary Blood Glucose: Lab Results  Component Value Date   GLUCAP 220 (H) 03/07/2017   GLUCAP 260 (H) 03/05/2017   GLUCAP 284 (H) 02/21/2017   GLUCAP 190 (H) 02/19/2017   GLUCAP 246 (H) 02/14/2017     Exercise Target Goals:    Exercise Program Goal: Individual exercise prescription set with THRR, safety & activity barriers. Participant demonstrates ability to  understand and report RPE using BORG scale, to self-measure pulse accurately, and to acknowledge the importance of the exercise prescription.  Exercise Prescription Goal: Starting with aerobic activity 30 plus minutes a day, 3 days per week for initial exercise prescription. Provide home exercise prescription and guidelines that participant acknowledges understanding prior to discharge.  Activity Barriers & Risk Stratification:     Activity Barriers & Cardiac Risk Stratification - 02/06/17 3474      Activity Barriers & Cardiac Risk Stratification   Activity Barriers Muscular Weakness;Deconditioning;Back Problems;Other (comment)   Comments R knee pain and L ankle discomfort   Cardiac Risk Stratification High      6 Minute Walk:     6 Minute Walk    Row Name 02/06/17 1638         6 Minute Walk   Phase Initial     Distance 1572 feet     Walk Time 6 minutes     # of Rest Breaks 0     MPH 2.98     METS 4.82     RPE 11     VO2 Peak 16.87     Symptoms No     Resting HR 92 bpm     Resting BP 132/80     Max Ex. HR 134 bpm     Max Ex. BP 162/78     2 Minute Post BP 140/80        Oxygen Initial Assessment:   Oxygen Re-Evaluation:   Oxygen Discharge (Final Oxygen Re-Evaluation):   Initial Exercise Prescription:     Initial Exercise Prescription - 02/06/17 1600      Date of Initial Exercise RX and Referring Provider   Date 02/06/17   Referring Provider Armanda Magic, MD.     Treadmill   MPH 2.2   Grade 1   Minutes 10   METs 2.99     Bike   Level 1   Minutes 10   METs 2.85     NuStep   Level 3   SPM 85   Minutes 10   METs 2.5     Prescription Details   Frequency (times per week) 3   Duration Progress to 30 minutes of continuous aerobic without signs/symptoms of physical distress     Intensity   THRR 40-80% of Max Heartrate 70-141   Ratings of Perceived Exertion 11-13   Perceived Dyspnea 0-4     Progression   Progression Continue to progress  workloads to maintain intensity without signs/symptoms of physical distress.     Resistance Training   Training Prescription Yes   Weight 2lbs   Reps 10-15  Perform Capillary Blood Glucose checks as needed.  Exercise Prescription Changes:     Exercise Prescription Changes    Row Name 02/21/17 1600 03/05/17 1700           Response to Exercise   Blood Pressure (Admit) 146/82 132/86      Blood Pressure (Exercise) 154/100 160/80      Blood Pressure (Exit) 126/80 134/80      Heart Rate (Admit) 95 bpm 93 bpm      Heart Rate (Exercise) 121 bpm 117 bpm      Heart Rate (Exit) 94 bpm 96 bpm      Rating of Perceived Exertion (Exercise) 12 13      Symptoms none none      Comments pt had a late start to exercise  -      Duration Continue with 30 min of aerobic exercise without signs/symptoms of physical distress. Continue with 30 min of aerobic exercise without signs/symptoms of physical distress.      Intensity THRR unchanged THRR unchanged        Progression   Progression Continue to progress workloads to maintain intensity without signs/symptoms of physical distress. Continue to progress workloads to maintain intensity without signs/symptoms of physical distress.      Average METs 2.9 2.6        Resistance Training   Training Prescription Yes Yes      Weight 2lbs 3lbs      Reps 10-15 10-15      Time 10 Minutes 10 Minutes        Treadmill   MPH 2.2 2.2      Grade 1 2      Minutes 15 15      METs 2.99 3.29        NuStep   Level  - 3      SPM  - 75      Minutes  - 15      METs  - 2         Exercise Comments:     Exercise Comments    Row Name 03/06/17 1509           Exercise Comments Reviewed METs and goals. Pt is tolerating exercise well; will continue to monitor pt's progress and activity levels.          Exercise Goals and Review:     Exercise Goals    Row Name 02/06/17 0924             Exercise Goals   Increase Physical Activity Yes        Intervention Provide advice, education, support and counseling about physical activity/exercise needs.;Develop an individualized exercise prescription for aerobic and resistive training based on initial evaluation findings, risk stratification, comorbidities and participant's personal goals.       Expected Outcomes Achievement of increased cardiorespiratory fitness and enhanced flexibility, muscular endurance and strength shown through measurements of functional capacity and personal statement of participant.       Increase Strength and Stamina Yes  improve breathing capacity.       Intervention Provide advice, education, support and counseling about physical activity/exercise needs.;Develop an individualized exercise prescription for aerobic and resistive training based on initial evaluation findings, risk stratification, comorbidities and participant's personal goals.       Expected Outcomes Achievement of increased cardiorespiratory fitness and enhanced flexibility, muscular endurance and strength shown through measurements of functional capacity and personal statement of participant.  Exercise Goals Re-Evaluation :     Exercise Goals Re-Evaluation    Row Name 03/06/17 1507 03/07/17 1441           Exercise Goal Re-Evaluation   Exercise Goals Review Increase Physical Activity;Able to understand and use rate of perceived exertion (RPE) scale;Knowledge and understanding of Target Heart Rate Range (THRR);Understanding of Exercise Prescription;Increase Strength and Stamina;Able to check pulse independently Increase Physical Activity;Able to understand and use rate of perceived exertion (RPE) scale;Knowledge and understanding of Target Heart Rate Range (THRR);Increase Strength and Stamina;Able to check pulse independently;Understanding of Exercise Prescription      Comments Pt is tolerating WL increases fairly well; pt is currently walking 2.2/2 on treadmill Reviewed home exercise with pt  today.  Pt plans to walk for exercise, goal or target to get 5,000-7,000 steps per day.Reviewed THR, pulse, RPE, sign and symptoms, NTG use, and when to call 911 or MD.  Also discussed weather considerations and indoor options.  Pt voiced understanding.      Expected Outcomes Pt will continue to improve in cardiorespiratory fitness Pt will continue to improve in cardiorespiratory fitness          Discharge Exercise Prescription (Final Exercise Prescription Changes):     Exercise Prescription Changes - 03/05/17 1700      Response to Exercise   Blood Pressure (Admit) 132/86   Blood Pressure (Exercise) 160/80   Blood Pressure (Exit) 134/80   Heart Rate (Admit) 93 bpm   Heart Rate (Exercise) 117 bpm   Heart Rate (Exit) 96 bpm   Rating of Perceived Exertion (Exercise) 13   Symptoms none   Duration Continue with 30 min of aerobic exercise without signs/symptoms of physical distress.   Intensity THRR unchanged     Progression   Progression Continue to progress workloads to maintain intensity without signs/symptoms of physical distress.   Average METs 2.6     Resistance Training   Training Prescription Yes   Weight 3lbs   Reps 10-15   Time 10 Minutes     Treadmill   MPH 2.2   Grade 2   Minutes 15   METs 3.29     NuStep   Level 3   SPM 75   Minutes 15   METs 2      Nutrition:  Target Goals: Understanding of nutrition guidelines, daily intake of sodium 1500mg , cholesterol 200mg , calories 30% from fat and 7% or less from saturated fats, daily to have 5 or more servings of fruits and vegetables.  Biometrics:     Pre Biometrics - 02/06/17 1642      Pre Biometrics   Height 5' 4.75" (1.645 m)   Weight 226 lb 13.7 oz (102.9 kg)   Waist Circumference 42 inches   Hip Circumference 50 inches   Waist to Hip Ratio 0.84 %   BMI (Calculated) 38.1   Triceps Skinfold 42 mm   % Body Fat 47.2 %   Grip Strength 37 kg   Flexibility 12.5 in   Single Leg Stand 8 seconds        Nutrition Therapy Plan and Nutrition Goals:     Nutrition Therapy & Goals - 02/06/17 1450      Nutrition Therapy   Diet Carb Modified, Therapeutic Lifestyle changes     Personal Nutrition Goals   Nutrition Goal Pt to identify food quantities necessary to achieve weight loss of 6-24 lb (2.7-10.9 kg) at graduation from cardiac rehab. Initial goal wt of 199 lb desired.  Personal Goal #2 Pt to improve snack choices and portion sizes consumed.   Personal Goal #3 Improved glycemic control as evidenced by an improvement in A1c from 10.5 to less than 7.0     Intervention Plan   Intervention Prescribe, educate and counsel regarding individualized specific dietary modifications aiming towards targeted core components such as weight, hypertension, lipid management, diabetes, heart failure and other comorbidities.   Expected Outcomes Short Term Goal: Understand basic principles of dietary content, such as calories, fat, sodium, cholesterol and nutrients.;Long Term Goal: Adherence to prescribed nutrition plan.      Nutrition Discharge: Nutrition Scores:     Nutrition Assessments - 02/06/17 1452      MEDFICTS Scores   Pre Score 24      Nutrition Goals Re-Evaluation:   Nutrition Goals Re-Evaluation:   Nutrition Goals Discharge (Final Nutrition Goals Re-Evaluation):   Psychosocial: Target Goals: Acknowledge presence or absence of significant depression and/or stress, maximize coping skills, provide positive support system. Participant is able to verbalize types and ability to use techniques and skills needed for reducing stress and depression.  Initial Review & Psychosocial Screening:     Initial Psych Review & Screening - 02/06/17 1700      Initial Review   Current issues with Current Anxiety/Panic;Current Stress Concerns   Comments recent illness has effected her self confidence.  Pt is earger to learn more about a heart healthy lifestyle     Family Dynamics   Good  Support System? Yes     Barriers   Psychosocial barriers to participate in program The patient should benefit from training in stress management and relaxation.     Screening Interventions   Interventions Encouraged to exercise      Quality of Life Scores:     Quality of Life - 02/14/17 1409      Quality of Life Scores   Health/Function Pre --  pt concerned about recent cardiac event.  pt has anxiety driven dyspnea, which improves with relaxation.    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 pt verbalizes her belief in relaxation/spiritual activies for overall wellness.    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     GLOBAL Pre --  pt offered emotional support and reassurance.  pt does some anxiety about class time.  pt offered 2:45class time as possible alternative.  pt would like to keep 1:15, utilizing 2:45PRN      PHQ-9: Recent Review Flowsheet Data    Dep[facility]   Decreased Interest 1   Down, Depressed, Hopeless 1   PHQ - 2 Score 2   Altered sleeping 1   Tired, decreased energy 1   Change in appetite 1   Feeling bad or failure about yourself  1   Trouble concentrating 0   Moving slowly or fidgety/restless 0   Suicidal thoughts 0   PHQ-9 Score 6   Difficult doing work/chores Somewhat difficult     Interpretation of Total Score  Total Score Depression Severity:  1-4 = Minimal depression, 5-9 = Mild depression, 10-14 = Moderate depression, 15-19 = Moderately severe depression, 20-27 = Severe depression   Psychosocial Evaluation and Intervention:     Psychosocial Evaluation - 03/07/17 1708      Psychosocial Evaluation & Interventions   Interventions Stress management education;Relaxation education   Comments pt exhibits health related anxiety,especially from life changes that occurred as result of recent cardiac event.  Expected Outcomes pt will exhibit improved  outlook with good coping skills.    Continue Psychosocial Services  Follow up required by staff      Psychosocial Re-Evaluation:     Psychosocial Re-Evaluation    Row Name 03/07/17 1709             Psychosocial Re-Evaluation   Current issues with Current Stress Concerns       Comments pt with situational stress and health related anxiety.  pt displays improved mood and insight during CR sessions, pt has positive interactions with staff and peers. pt work conflict causes tardiness to CR sessions.        Expected Outcomes pt will exhibit positive outlook with good coping skills.        Interventions Stress management education;Relaxation education;Encouraged to attend Cardiac Rehabilitation for the exercise       Continue Psychosocial Services  No Follow up required          Psychosocial Discharge (Final Psychosocial Re-Evaluation):     Psychosocial Re-Evaluation - 03/07/17 1709      Psychosocial Re-Evaluation   Current issues with Current Stress Concerns   Comments pt with situational stress and health related anxiety.  pt displays improved mood and insight during CR sessions, pt has positive interactions with staff and peers. pt work conflict causes tardiness to CR sessions.    Expected Outcomes pt will exhibit positive outlook with good coping skills.    Interventions Stress management education;Relaxation education;Encouraged to attend Cardiac Rehabilitation for the exercise   Continue Psychosocial Services  No Follow up required      Vocational Rehabilitation: Provide vocational rehab assistance to qualifying candidates.   Vocational Rehab Evaluation & Intervention:     Vocational Rehab - 02/06/17 1704      Initial Vocational Rehab Evaluation & Intervention   Assessment shows need for Vocational Rehabilitation No      Education: Education Goals: Education classes will be provided on a weekly basis, covering required topics. Participant will state  understanding/return demonstration of topics presented.  Learning Barriers/Preferences:     Learning Barriers/Preferences - 02/06/17 1610      Learning Barriers/Preferences   Learning Barriers Sight   Learning Preferences Skilled Demonstration;Verbal Instruction;Written Material;Video;Pictoral      Education Topics: Count Your Pulse:  -Group instruction provided by verbal instruction, demonstration, patient participation and written materials to support subject.  Instructors address importance of being able to find your pulse and how to count your pulse when at home without a heart monitor.  Patients get hands on experience counting their pulse with staff help and individually.   Heart Attack, Angina, and Risk Factor Modification:  -Group instruction provided by verbal instruction, video, and written materials to support subject.  Instructors address signs and symptoms of angina and heart attacks.    Also discuss risk factors for heart disease and how to make changes to improve heart health risk factors.   Functional Fitness:  -Group instruction provided by verbal instruction, demonstration, patient participation, and written materials to support subject.  Instructors address safety measures for doing things around the house.  Discuss how to get up and down off the floor, how to pick things up properly, how to safely get out of a chair without assistance, and balance training.   Meditation and Mindfulness:  -Group instruction provided by verbal instruction, patient participation, and written materials to support subject.  Instructor addresses importance of mindfulness and meditation practice to help reduce stress and improve awareness.  Instructor also leads participants through a meditation exercise.    Stretching for Flexibility and Mobility:  -Group instruction provided by verbal instruction, patient participation, and written materials to support subject.  Instructors lead  participants through series of stretches that are designed to increase flexibility thus improving mobility.  These stretches are additional exercise for major muscle groups that are typically performed during regular warm up and cool down.   Hands Only CPR:  -Group verbal, video, and participation provides a basic overview of AHA guidelines for community CPR. Role-play of emergencies allow participants the opportunity to practice calling for help and chest compression technique with discussion of AED use.   Hypertension: -Group verbal and written instruction that provides a basic overview of hypertension including the most recent diagnostic guidelines, risk factor reduction with self-care instructions and medication management.    Nutrition I class: Heart Healthy Eating:  -Group instruction provided by PowerPoint slides, verbal discussion, and written materials to support subject matter. The instructor gives an explanation and review of the Therapeutic Lifestyle Changes diet recommendations, which includes a discussion on lipid goals, dietary fat, sodium, fiber, plant stanol/sterol esters, sugar, and the components of a well-balanced, healthy diet.   CARDIAC REHAB PHASE II EXERCISE from 03/05/2017 in [facility] CARDIAC REHAB  Date  03/05/17  Educator  RD  Instruction Review Code  Not applicable      Nutrition II class: Lifestyle Skills:  -Group instruction provided by PowerPoint slides, verbal discussion, and written materials to support subject matter. The instructor gives an explanation and review of label reading, grocery shopping for heart health, heart healthy recipe modifications, and ways to make healthier choices when eating out.   CARDIAC REHAB PHASE II EXERCISE from 03/05/2017 in [facility] CARDIAC REHAB  Date  03/05/17  Educator  RD  Instruction Review Code  Not applicable      Diabetes Question & Answer:  -Group instruction provided by  PowerPoint slides, verbal discussion, and written materials to support subject matter. The instructor gives an explanation and review of diabetes co-morbidities, pre- and post-prandial blood glucose goals, pre-exercise blood glucose goals, signs, symptoms, and treatment of hypoglycemia and hyperglycemia, and foot care basics.   Diabetes Blitz:  -Group instruction provided by PowerPoint slides, verbal discussion, and written materials to support subject matter. The instructor gives an explanation and review of the physiology behind type 1 and type 2 diabetes, diabetes medications and rational behind using different medications, pre- and post-prandial blood glucose recommendations and Hemoglobin A1c goals, diabetes diet, and exercise including blood glucose guidelines for exercising safely.    CARDIAC REHAB PHASE II EXERCISE from 03/05/2017 in [facility] CARDIAC REHAB  Date  03/05/17  Educator  RD  Instruction Review Code  Not applicable      Portion Distortion:  -Group instruction provided by PowerPoint slides, verbal discussion, written materials, and food models to support subject matter. The instructor gives an explanation of serving size versus portion size, changes in portions sizes over the last 20 years, and what consists of a serving from each food group.   Stress Management:  -Group instruction provided by verbal instruction, video, and written materials to support subject matter.  Instructors review role of stress in heart disease and how to cope with stress positively.     Exercising on Your Own:  -Group instruction provided by verbal instruction, power point, and written materials to support subject.  Instructors discuss benefits of exercise, components of exercise, frequency and  intensity of exercise, and end points for exercise.  Also discuss use of nitroglycerin and activating EMS.  Review options of places to exercise outside of rehab.  Review guidelines for sex  with heart disease.   Cardiac Drugs I:  -Group instruction provided by verbal instruction and written materials to support subject.  Instructor reviews cardiac drug classes: antiplatelets, anticoagulants, beta blockers, and statins.  Instructor discusses reasons, side effects, and lifestyle considerations for each drug class.   Cardiac Drugs II:  -Group instruction provided by verbal instruction and written materials to support subject.  Instructor reviews cardiac drug classes: angiotensin converting enzyme inhibitors (ACE-I), angiotensin II receptor blockers (ARBs), nitrates, and calcium channel blockers.  Instructor discusses reasons, side effects, and lifestyle considerations for each drug class.   Anatomy and Physiology of the Circulatory System:  Group verbal and written instruction and models provide basic cardiac anatomy and physiology, with the coronary electrical and arterial systems. Review of: AMI, Angina, Valve disease, Heart Failure, Peripheral Artery Disease, Cardiac Arrhythmia, Pacemakers, and the ICD.   Other Education:  -Group or individual verbal, written, or video instructions that support the educational goals of the cardiac rehab program.   Knowledge Questionnaire Score:     Knowledge Questionnaire Score - 02/06/17 1644      Knowledge Questionnaire Score   Pre Score 20/28      Core Components/Risk Factors/Patient Goals at Admission:     Personal Goals and Risk Factors at Admission - 02/06/17 0925      Core Components/Risk Factors/Patient Goals on Admission    Weight Management Yes;Weight Maintenance;Weight Loss;Obesity   Intervention Weight Management: Develop a combined nutrition and exercise program designed to reach desired caloric intake, while maintaining appropriate intake of nutrient and fiber, sodium and fats, and appropriate energy expenditure required for the weight goal.;Weight Management: Provide education and appropriate resources to help  participant work on and attain dietary goals.;Weight Management/Obesity: Establish reasonable short term and long term weight goals.;Obesity: Provide education and appropriate resources to help participant work on and attain dietary goals.   Admit Weight 226 lb 13.7 oz (102.9 kg)   Goal Weight: Short Term 216 lb (98 kg)   Goal Weight: Long Term 199 lb (90.3 kg)   Expected Outcomes Short Term: Continue to assess and modify interventions until short term weight is achieved;Long Term: Adherence to nutrition and physical activity/exercise program aimed toward attainment of established weight goal;Weight Maintenance: Understanding of the daily nutrition guidelines, which includes 25-35% calories from fat, 7% or less cal from saturated fats, less than  cholesterol, less than 1.5gm of sodium, & 5 or more servings of fruits and vegetables daily;Weight Loss: Understanding of general recommendations for a balanced deficit meal plan, which promotes 1-2 lb weight loss per week and includes a negative energy balance of [phone] kcal/d;Understanding recommendations for meals to include 15-35% energy as protein, 25-35% energy from fat, 35-60% energy from carbohydrates, less than  of dietary cholesterol, 20-35 gm of total fiber daily;Understanding of distribution of calorie intake throughout the day with the consumption of 4-5 meals/snacks   Diabetes Yes   Intervention Provide education about signs/symptoms and action to take for hypo/hyperglycemia.;Provide education about proper nutrition, including hydration, and aerobic/resistive exercise prescription along with prescribed medications to achieve blood glucose in normal ranges: Fasting glucose 65-99 mg/dL   Expected Outcomes Long Term: Attainment of HbA1C < 7%.;Short Term: Participant verbalizes understanding of the signs/symptoms and immediate care of hyper/hypoglycemia, proper foot care and importance of medication, aerobic/resistive exercise and nutrition  plan  for blood glucose control.   Stress Yes   Intervention Offer individual and/or small group education and counseling on adjustment to heart disease, stress management and health-related lifestyle change. Teach and support self-help strategies.;Refer participants experiencing significant psychosocial distress to appropriate mental health specialists for further evaluation and treatment. When possible, include family members and significant others in education/counseling sessions.   Expected Outcomes Short Term: Participant demonstrates changes in health-related behavior, relaxation and other stress management skills, ability to obtain effective social support, and compliance with psychotropic medications if prescribed.;Long Term: Emotional wellbeing is indicated by absence of clinically significant psychosocial distress or social isolation.      Core Components/Risk Factors/Patient Goals Review:      Goals and Risk Factor Review    Row Name 03/07/17 1704             Core Components/Risk Factors/Patient Goals Review   Personal Goals Review Weight Management/Obesity;Diabetes;Other       Review pt with CAD RF demonstrates willingness to participate in CR exercise, nutrition and education opportunities. pt has work barriers that distract her ability to participate in CR sessions.        Expected Outcomes pt will participate in CR exercise, nutrition, diabetes control and lifestyle modifications to decrease overall RF.            Core Components/Risk Factors/Patient Goals at Discharge (Final Review):      Goals and Risk Factor Review - 03/07/17 1704      Core Components/Risk Factors/Patient Goals Review   Personal Goals Review Weight Management/Obesity;Diabetes;Other   Review pt with CAD RF demonstrates willingness to participate in CR exercise, nutrition and education opportunities. pt has work barriers that distract her ability to participate in CR sessions.    Expected Outcomes pt will  participate in CR exercise, nutrition, diabetes control and lifestyle modifications to decrease overall RF.        ITP Comments:     ITP Comments    Row Name 02/06/17 0920 03/07/17 1703         ITP Comments Medical Director, Dr. Armanda Magic Medical Director, Dr. Armanda Magic         Comments: Pt is making expected progress toward personal goals after completing 5 sessions. Recommend continued exercise and life style modification education including  stress management and relaxation techniques to decrease cardiac risk profile.

## 2017-03-12 ENCOUNTER — Encounter (HOSPITAL_COMMUNITY): Payer: BLUE CROSS/BLUE SHIELD

## 2017-03-14 ENCOUNTER — Telehealth (HOSPITAL_COMMUNITY): Payer: Self-pay | Admitting: Cardiac Rehabilitation

## 2017-03-14 ENCOUNTER — Encounter (HOSPITAL_COMMUNITY)
Admission: RE | Admit: 2017-03-14 | Discharge: 2017-03-14 | Disposition: A | Discharge status: Home / Self Care | Payer: BLUE CROSS/BLUE SHIELD | Source: Ambulatory Visit | Attending: Cardiology | Admitting: Cardiology

## 2017-03-14 VITALS — Wt 225.3 lb

## 2017-03-14 DIAGNOSIS — I252 Old myocardial infarction: Secondary | ICD-10-CM | POA: Diagnosis not present

## 2017-03-14 DIAGNOSIS — Z955 Presence of coronary angioplasty implant and graft: Secondary | ICD-10-CM

## 2017-03-14 DIAGNOSIS — I214 Non-ST elevation (NSTEMI) myocardial infarction: Secondary | ICD-10-CM

## 2017-03-14 NOTE — Telephone Encounter (Signed)
Pt arrived at cardiac rehab requesting transfer of care to [facility] outpatient cardiac rehab.  LM for [facility].  Request for transfer faxed.  Pt informed [facility] will contact her for enrollment scheduling. Pt verbalized understanding. Pt would like to continue care in [place] until transfer occurs.

## 2017-03-19 ENCOUNTER — Encounter (HOSPITAL_COMMUNITY)
Admission: RE | Admit: 2017-03-19 | Discharge: 2017-03-19 | Disposition: A | Discharge status: Home / Self Care | Payer: BLUE CROSS/BLUE SHIELD | Source: Ambulatory Visit | Attending: Cardiology | Admitting: Cardiology

## 2017-03-21 ENCOUNTER — Encounter (HOSPITAL_COMMUNITY): Payer: BLUE CROSS/BLUE SHIELD

## 2017-03-26 ENCOUNTER — Encounter (HOSPITAL_COMMUNITY): Payer: BLUE CROSS/BLUE SHIELD

## 2017-03-28 ENCOUNTER — Encounter (HOSPITAL_COMMUNITY): Payer: BLUE CROSS/BLUE SHIELD

## 2017-03-28 NOTE — Progress Notes (Signed)
Discharge Progress Report  Patient Details  Name: Cynthia Kirby MRN: [id number] Date of Birth: Sep 16, 1970 Referring Provider:     CARDIAC REHAB PHASE II ORIENTATION from 02/06/2017 in [facility]  Referring Provider  Armanda Magic, MD.       Number of Visits: 8   Reason for Discharge:  Early Exit:  Personal:  Pt transferred to [facility] .  Smoking History:  History  Smoking Status  . Passive Smoke Exposure - Never Smoker  . Types: Cigarettes  Smokeless Tobacco  . Never Used    Comment: friends and family.     Diagnosis:  11/12/16 NSTEMI (non-ST elevated myocardial infarction) (HCC)  11/13/16 Status post coronary artery stent placement  ADL UCSD:   Initial Exercise Prescription:     Initial Exercise Prescription - 02/06/17 1600      Date of Initial Exercise RX and Referring Provider   Date 02/06/17   Referring Provider Armanda Magic, MD.     Treadmill   MPH 2.2   Grade 1   Minutes 10   METs 2.99     Bike   Level 1   Minutes 10   METs 2.85     NuStep   Level 3   SPM 85   Minutes 10   METs 2.5     Prescription Details   Frequency (times per week) 3   Duration Progress to 30 minutes of continuous aerobic without signs/symptoms of physical distress     Intensity   THRR 40-80% of Max Heartrate 70-141   Ratings of Perceived Exertion 11-13   Perceived Dyspnea 0-4     Progression   Progression Continue to progress workloads to maintain intensity without signs/symptoms of physical distress.     Resistance Training   Training Prescription Yes   Weight 2lbs   Reps 10-15      Discharge Exercise Prescription (Final Exercise Prescription Changes):     Exercise Prescription Changes - 03/14/17 1150      Response to Exercise   Blood Pressure (Admit) 140/60   Blood Pressure (Exercise) 138/88   Blood Pressure (Exit) 134/84   Heart Rate (Admit) 92 bpm   Heart Rate (Exercise) 122 bpm   Heart Rate  (Exit) 91 bpm   Rating of Perceived Exertion (Exercise) 13   Symptoms none   Duration Continue with 30 min of aerobic exercise without signs/symptoms of physical distress.   Intensity THRR unchanged     Progression   Progression Continue to progress workloads to maintain intensity without signs/symptoms of physical distress.   Average METs 3.5     Resistance Training   Training Prescription Yes   Weight 3lbs   Reps 10-15   Time 10 Minutes     Treadmill   MPH 2.5   Grade 3   Minutes 15   METs 3.95     NuStep   Level 3   SPM 80   Minutes 15   METs 3.1     Home Exercise Plan   Plans to continue exercise at Home (comment)  walking   Frequency Add 3 additional days to program exercise sessions.   Initial Home Exercises Provided 03/07/17      Functional Capacity:     6 Minute Walk    Row Name 02/06/17 1638         6 Minute Walk   Phase Initial     Distance 1572 feet     Walk Time 6  minutes     # of Rest Breaks 0     MPH 2.98     METS 4.82     RPE 11     VO2 Peak 16.87     Symptoms No     Resting HR 92 bpm     Resting BP 132/80     Max Ex. HR 134 bpm     Max Ex. BP 162/78     2 Minute Post BP 140/80        Psychological, QOL, Others - Outcomes: PHQ 2/9: Depression screen PHQ 2/9 02/12/2017  Decreased Interest 1  Down, Depressed, Hopeless 1  PHQ - 2 Score 2  Altered sleeping 1  Tired, decreased energy 1  Change in appetite 1  Feeling bad or failure about yourself  1  Trouble concentrating 0  Moving slowly or fidgety/restless 0  Suicidal thoughts 0  PHQ-9 Score 6  Difficult doing work/chores Somewhat difficult    Quality of Life:     Quality of Life - 02/14/17 1409      Quality of Life Scores   Health/Function Pre --  pt concerned about recent cardiac event.  pt has anxiety driven dyspnea, which improves with relaxation.    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 pt verbalizes her belief in  relaxation/spiritual activies for overall wellness.    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     GLOBAL Pre --  pt offered emotional support and reassurance.  pt does some anxiety about class time.  pt offered 2:45class time as possible alternative.  pt would like to keep 1:15, utilizing 2:45PRN      Personal Goals: Goals established at orientation with interventions provided to work toward goal.     Personal Goals and Risk Factors at Admission - 02/06/17 0925      Core Components/Risk Factors/Patient Goals on Admission    Weight Management Yes;Weight Maintenance;Weight Loss;Obesity   Intervention Weight Management: Develop a combined nutrition and exercise program designed to reach desired caloric intake, while maintaining appropriate intake of nutrient and fiber, sodium and fats, and appropriate energy expenditure required for the weight goal.;Weight Management: Provide education and appropriate resources to help participant work on and attain dietary goals.;Weight Management/Obesity: Establish reasonable short term and long term weight goals.;Obesity: Provide education and appropriate resources to help participant work on and attain dietary goals.   Admit Weight 226 lb 13.7 oz (102.9 kg)   Goal Weight: Short Term 216 lb (98 kg)   Goal Weight: Long Term 199 lb (90.3 kg)   Expected Outcomes Short Term: Continue to assess and modify interventions until short term weight is achieved;Long Term: Adherence to nutrition and physical activity/exercise program aimed toward attainment of established weight goal;Weight Maintenance: Understanding of the daily nutrition guidelines, which includes 25-35% calories from fat, 7% or less cal from saturated fats, less than  cholesterol, less than 1.5gm of sodium, & 5 or more servings of fruits and vegetables daily;Weight Loss: Understanding of general recommendations for a balanced deficit meal plan, which promotes 1-2  lb weight loss per week and includes a negative energy balance of [phone] kcal/d;Understanding recommendations for meals to include 15-35% energy as protein, 25-35% energy from fat, 35-60% energy from carbohydrates, less than  of dietary cholesterol, 20-35 gm of total fiber daily;Understanding of distribution of calorie intake throughout the day with the consumption of 4-5 meals/snacks   Diabetes Yes   Intervention Provide  education about signs/symptoms and action to take for hypo/hyperglycemia.;Provide education about proper nutrition, including hydration, and aerobic/resistive exercise prescription along with prescribed medications to achieve blood glucose in normal ranges: Fasting glucose 65-99 mg/dL   Expected Outcomes Long Term: Attainment of HbA1C < 7%.;Short Term: Participant verbalizes understanding of the signs/symptoms and immediate care of hyper/hypoglycemia, proper foot care and importance of medication, aerobic/resistive exercise and nutrition plan for blood glucose control.   Stress Yes   Intervention Offer individual and/or small group education and counseling on adjustment to heart disease, stress management and health-related lifestyle change. Teach and support self-help strategies.;Refer participants experiencing significant psychosocial distress to appropriate mental health specialists for further evaluation and treatment. When possible, include family members and significant others in education/counseling sessions.   Expected Outcomes Short Term: Participant demonstrates changes in health-related behavior, relaxation and other stress management skills, ability to obtain effective social support, and compliance with psychotropic medications if prescribed.;Long Term: Emotional wellbeing is indicated by absence of clinically significant psychosocial distress or social isolation.       Personal Goals Discharge:     Goals and Risk Factor Review    Row Name 03/07/17 1704 03/20/17  0733           Core Components/Risk Factors/Patient Goals Review   Personal Goals Review Weight Management/Obesity;Diabetes;Other Weight Management/Obesity;Diabetes;Other      Review pt with CAD RF demonstrates willingness to participate in CR exercise, nutrition and education opportunities. pt has work barriers that distract her ability to participate in CR sessions.  pt has requested transfer to [facility]. pt scheduled to begin 03/21/2017.        Expected Outcomes pt will participate in CR exercise, nutrition, diabetes control and lifestyle modifications to decrease overall RF.   pt will participate in CR exercise, nutrition, diabetes control and lifestyle modifications to decrease overall RF.           Exercise Goals and Review:     Exercise Goals    Row Name 02/06/17 0924             Exercise Goals   Increase Physical Activity Yes       Intervention Provide advice, education, support and counseling about physical activity/exercise needs.;Develop an individualized exercise prescription for aerobic and resistive training based on initial evaluation findings, risk stratification, comorbidities and participant's personal goals.       Expected Outcomes Achievement of increased cardiorespiratory fitness and enhanced flexibility, muscular endurance and strength shown through measurements of functional capacity and personal statement of participant.       Increase Strength and Stamina Yes  improve breathing capacity.       Intervention Provide advice, education, support and counseling about physical activity/exercise needs.;Develop an individualized exercise prescription for aerobic and resistive training based on initial evaluation findings, risk stratification, comorbidities and participant's personal goals.       Expected Outcomes Achievement of increased cardiorespiratory fitness and enhanced flexibility, muscular endurance and strength shown through measurements of  functional capacity and personal statement of participant.          Nutrition & Weight - Outcomes:     Pre Biometrics - 02/06/17 1642      Pre Biometrics   Height 5' 4.75" (1.645 m)   Weight 226 lb 13.7 oz (102.9 kg)   Waist Circumference 42 inches   Hip Circumference 50 inches   Waist to Hip Ratio 0.84 %   BMI (Calculated) 38.1   Triceps Skinfold 42 mm   %  Body Fat 47.2 %   Grip Strength 37 kg   Flexibility 12.5 in   Single Leg Stand 8 seconds         Post Biometrics - 03/27/17 1154       Post  Biometrics   Weight 225 lb 5 oz (102.2 kg)      Nutrition:     Nutrition Therapy & Goals - 02/06/17 1450      Nutrition Therapy   Diet Carb Modified, Therapeutic Lifestyle changes     Personal Nutrition Goals   Nutrition Goal Pt to identify food quantities necessary to achieve weight loss of 6-24 lb (2.7-10.9 kg) at graduation from cardiac rehab. Initial goal wt of 199 lb desired.    Personal Goal #2 Pt to improve snack choices and portion sizes consumed.   Personal Goal #3 Improved glycemic control as evidenced by an improvement in A1c from 10.5 to less than 7.0     Intervention Plan   Intervention Prescribe, educate and counsel regarding individualized specific dietary modifications aiming towards targeted core components such as weight, hypertension, lipid management, diabetes, heart failure and other comorbidities.   Expected Outcomes Short Term Goal: Understand basic principles of dietary content, such as calories, fat, sodium, cholesterol and nutrients.;Long Term Goal: Adherence to prescribed nutrition plan.      Nutrition Discharge:     Nutrition Assessments - 02/06/17 1452      MEDFICTS Scores   Pre Score 24      Education Questionnaire Score:     Knowledge Questionnaire Score - 02/06/17 1644      Knowledge Questionnaire Score   Pre Score 20/28      Goals reviewed with patient; copy given to patient.

## 2017-04-02 ENCOUNTER — Encounter (HOSPITAL_COMMUNITY): Payer: BLUE CROSS/BLUE SHIELD

## 2017-04-03 ENCOUNTER — Ambulatory Visit (INDEPENDENT_AMBULATORY_CARE_PROVIDER_SITE_OTHER): Payer: BLUE CROSS/BLUE SHIELD | Admitting: Pulmonary Disease

## 2017-04-03 ENCOUNTER — Encounter: Payer: Self-pay | Admitting: Pulmonary Disease

## 2017-04-03 VITALS — BP 130/84 | HR 81 | Ht 66.0 in | Wt 230.0 lb

## 2017-04-03 DIAGNOSIS — Z9989 Dependence on other enabling machines and devices: Secondary | ICD-10-CM | POA: Diagnosis not present

## 2017-04-03 DIAGNOSIS — K219 Gastro-esophageal reflux disease without esophagitis: Secondary | ICD-10-CM

## 2017-04-03 DIAGNOSIS — R0600 Dyspnea, unspecified: Secondary | ICD-10-CM | POA: Diagnosis not present

## 2017-04-03 DIAGNOSIS — J309 Allergic rhinitis, unspecified: Secondary | ICD-10-CM | POA: Diagnosis not present

## 2017-04-03 DIAGNOSIS — G4733 Obstructive sleep apnea (adult) (pediatric): Secondary | ICD-10-CM

## 2017-04-03 LAB — PULMONARY FUNCTION TEST
DL/VA % pred: 101 %
DL/VA: 5.1 ml/min/mmHg/L
DLCO COR % PRED: 78 %
DLCO UNC: 20.66 ml/min/mmHg
DLCO cor: 21.13 ml/min/mmHg
DLCO unc % pred: 76 %
FEF 25-75 POST: 3.71 L/s
FEF 25-75 Pre: 3.66 L/sec
FEF2575-%Change-Post: 1 %
FEF2575-%PRED-PRE: 118 %
FEF2575-%Pred-Post: 120 %
FEV1-%Change-Post: 0 %
FEV1-%PRED-POST: 85 %
FEV1-%PRED-PRE: 86 %
FEV1-POST: 2.67 L
FEV1-Pre: 2.69 L
FEV1FVC-%Change-Post: 2 %
FEV1FVC-%Pred-Pre: 108 %
FEV6-%CHANGE-POST: -2 %
FEV6-%PRED-PRE: 80 %
FEV6-%Pred-Post: 78 %
FEV6-POST: 2.98 L
FEV6-PRE: 3.06 L
FEV6FVC-%PRED-POST: 102 %
FEV6FVC-%PRED-PRE: 102 %
FVC-%CHANGE-POST: -2 %
FVC-%PRED-POST: 76 %
FVC-%PRED-PRE: 78 %
FVC-POST: 2.98 L
FVC-Pre: 3.06 L
POST FEV6/FVC RATIO: 100 %
PRE FEV1/FVC RATIO: 88 %
Post FEV1/FVC ratio: 90 %
Pre FEV6/FVC Ratio: 100 %
RV % pred: 80 %
RV: 1.42 L
TLC % PRED: 80 %
TLC: 4.29 L

## 2017-04-03 MED ORDER — RANITIDINE HCL 150 MG PO TABS: 150.0000 mg | ORAL_TABLET | Freq: Two times a day (BID) | ORAL | 3 refills | Status: DC

## 2017-04-03 MED ORDER — MONTELUKAST SODIUM 10 MG PO TABS: 10.0000 mg | ORAL_TABLET | Freq: Every day | ORAL | 3 refills | Status: DC

## 2017-04-03 NOTE — Patient Instructions (Addendum)
   We are starting you on Montelukast (Singulair) to help with your allergies and breathing. If you don't feel like it is helping after 2 weeks then you can stop taking it.  Start taking your Pantoprazole (Protonix) in the morning 30 minutes before breakfast - on an empty stomach.  I want you to start taking the Ranitidine (Zantac) at night before bed that we are sending to your pharmacy today.  We will follow-up with you after your sleep study to see how things are going.  Please call or e-mail if you have any questions or concerns.  TESTS ORDERED: 1. Split night sleep study

## 2017-04-03 NOTE — Addendum Note (Signed)
Addended by: Etheleen Mayhew C on: 04/03/2017 04:20 PM   Modules accepted: Orders

## 2017-04-03 NOTE — Progress Notes (Signed)
Subjective:    Patient ID: Cynthia [facility], female    DOB: March 06, 1971, 46 y.o.   MRN: [id number]  C.C.:  Follow-up for Dyspnea, GERD, Chronic Allergic Rhinitis, & OSA.  HPI Dyspnea: Suspected underlying asthma. Started after previous myocardial infarction. No significant desaturation during previous 6 minute walk test. Her pulmonary function testing today is completely normal. She reports her dyspnea is "off and on". She does feel her dyspnea is affected by her abdomen & what she feels is her hiatal hernia. She reports intermittent coughing & wheezing.   GERD: Prescribed Protonix but is using it only intermittently. Esophagogram/barium swallow with small hiatal hernia but no obvious reflux. She is having reflux 5 times weekly. She does have a morning brash water taste and refluxes into her throat.   Chronic allergic rhinitis: Some evidence of maxillary sinus inflammation. No significant serum antigens identified. Patient on nasal saline rinse and Flonase at last appointment. She does have increased sinus congestion & drainage.   OSA: Patient previously tested but lost significant weight since then. She is not currently on CPAP therapy. She feel her anxiety affects her sleep. She doesn't feel well rested in the morning. Wakes up with a headache every day. She does nap frequently.   Review of Systems She reports some mild palpitations. No new chest pain or tightness. No fever or chills. Does have some abdominal discomfort in her upper abdomen & nausea.  Allergies  Allergen Reactions  . Penicillins Anaphylaxis and Itching  . Dapagliflozin     Other reaction(s): Other Genital mycotic infections  . Lisinopril     Other reaction(s): COUGH    Current Outpatient Prescriptions on File Prior to Visit  Medication Sig Dispense Refill  . amLODipine (NORVASC) 10 MG tablet Take 1 tablet (10 mg total) by mouth daily. 90 tablet 3  . aspirin 81 MG chewable tablet Chew 1 tablet (81 mg total) by mouth daily.  30 tablet 11  . fluticasone (FLONASE) 50 MCG/ACT nasal spray Place 2 sprays into both nostrils daily.    Marland Kitchen losartan (COZAAR) 100 MG tablet Take 1 tablet (100 mg total) by mouth daily. 90 tablet 3  . metFORMIN (GLUCOPHAGE) 1000 MG tablet Take 1,000 mg by mouth 2 (two) times daily with a meal.     . metoprolol succinate (TOPROL-XL) 50 MG 24 hr tablet Take 50 mg by mouth daily. Take with or immediately following a meal.    . nitroGLYCERIN (NITROSTAT) 0.4 MG SL tablet Place 1 tablet (0.4 mg total) under the tongue every 5 (five) minutes x 3 doses as needed for chest pain. 25 tablet 3  . pantoprazole (PROTONIX) 40 MG tablet Take 1 tablet by mouth 2 (two) times daily as needed.   0  . saxagliptin HCl (ONGLYZA) 2.5 MG TABS tablet Take 5 mg by mouth daily.    Marland Kitchen spironolactone (ALDACTONE) 25 MG tablet Take 1 tablet (25 mg total) by mouth daily. 90 tablet 3  . rosuvastatin (CRESTOR) 10 MG tablet Take 1 tablet (10 mg total) by mouth daily. 90 tablet 3   No current facility-administered medications on file prior to visit.     Past Medical History:  Diagnosis Date  . Allergic rhinitis   . Anemia   . Anxiety   . Arthritis    "left ankle; knees" (11/13/2016)  . CAD in native artery 11/12/2016   A. NSTEMI with LHC 11/13/16 RCA 50%, Circ 40%, 2nd OM 40%, 2nd diag 70%, Mid LAD 95%- DES placed. EF normal.  .  Depression    "hx; not now" (11/13/2016)  . GERD (gastroesophageal reflux disease)   . Headache    "weekly" (11/13/2016)  . Hyperlipidemia   . Hypertension   . Hypothyroidism   . Migraine    "weekly" (11/13/2016)  . Sleep apnea    "CPAP RX'd; never used" (11/13/2016)  . Type II diabetes mellitus (HCC)     Past Surgical History:  Procedure Laterality Date  . CORONARY ANGIOPLASTY WITH STENT PLACEMENT  11/13/2016  . CORONARY STENT INTERVENTION N/A 11/13/2016   Procedure: Coronary Stent Intervention;  Surgeon: Corky Crafts, MD;  Location: [facility] INVASIVE CV LAB;  Service: Cardiovascular;   Laterality: N/A;  . INDUCED ABORTION    . LEFT HEART CATH AND CORONARY ANGIOGRAPHY N/A 11/13/2016   Procedure: Left Heart Cath and Coronary Angiography;  Surgeon: Corky Crafts, MD;  Location: [facility] INVASIVE CV LAB;  Service: Cardiovascular;  Laterality: N/A;  . TONSILLECTOMY AND ADENOIDECTOMY  2011    Family History  Problem Relation Age of Onset  . Hypertension Mother   . Diabetes Mother   . Hypertension Father   . Diabetes Father        borderline  . Prostate cancer Father   . Healthy Sister   . Healthy Brother   . Lung disease Neg Hx     Social History   Social History  . Marital status: Single    Spouse name: N/A  . Number of children: N/A  . Years of education: N/A   Social History Main Topics  . Smoking status: Passive Smoke Exposure - Never Smoker    Types: Cigarettes  . Smokeless tobacco: Never Used     Comment: friends and family.   . Alcohol use Yes     Comment: 11/13/2016 "might have a couple drinks/year"  . Drug use: No  . Sexual activity: Yes    Birth control/ protection: None   Other Topics Concern  . None   Social History Narrative   [facility] (01/02/17):   Originally from [place]. She moved to [facility] in 2013. She is a Scientist, research (life sciences). Her parents retired to [employer]. 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 She reports remote exposure to a cockatiel & parakeets. She has questionable recent exposure to mold. No hot tub exposure. Enjoys writing music.       Objective:   Physical Exam BP 130/84 (BP Location: Left Arm, Cuff Size: Normal)   Pulse 81   Ht  (1.676 m)   Wt 230 lb (104.3 kg)   SpO2 97%   BMI 37.12 kg/m   General:  Awake. Alert. Central obesity.  Integument: Warm. Dry. No rash on exposed skin. No bruising on exposed skin. Tattoo noted on neck. Extremities:  No cyanosis or clubbing.  HEENT:  Mild to moderate bilateral nasal turbinate swelling with erythematous mucosa. No oral ulcers. Moist mucous  membranes. Cardiovascular:  Regular rate. No edema. Regular rhythm.  Pulmonary:  Clear bilaterally to auscultation. Normal work of breathing on room air. Abdomen: Soft. Normal bowel sounds. Protuberant. Musculoskeletal:  Normal bulk and tone. No joint deformity or effusion appreciated. Neurological:  Cranial nerves 2-12 grossly in tact. No meningismus. Moving all 4 extremities equally.   PFT 04/03/17: FVC 3.06 L (78%) FEV1 2.69 L (86%) FEV1/FVC 0.88 FEF 25-75 3.66 L (118%) negative bronchodilator response TLC 4.29 L (80%) RV 80% ERV 20% DLCO corrected 78%  02/13/17:  Walked 408 meters / Baseline  Sat 100% on RA / Nadir Sat 100% on RA (had chest discomfort during test that resolved w/ rest)  IMAGING ESOPHAGRAM/BARIUM SWALLOW 01/09/17 (per radiologist):  Negative esophagram aside from a small gastric hiatal hernia, significance of which is doubtful.  MAXILLOFACIAL CT LTD W/O 01/08/17 (per radiologist):  Mild mucosal edema maxillary sinus bilaterally  CXR PA/LAT 11/24/16 (previously reviewed by me):  No parenchymal mass or opacity appreciated. No pleural effusion. Heart normal in size & mediastinum normal in contour.  CT CHEST W/ CONTRAST 03/27/15 (previously reviewed by me):  No parenchymal nodule, mass, or opacity appreciated. Normal airways. No pleural effusion or thickening. No pericardial effusion. No pathologic mediastinal adenopathy. Small hiatal hernia noted. Hepatic steatosis present on limited upper abdominal windows. Also some scoliosis is present.  CARDIAC TTE (11/27/16):  LV normal in size with moderate LVH. EF 60-65% with grade 1 diastolic dysfunction. LA & RA normal in size. RV normal in size and function. No aortic stenosis or regurgitation. Aortic root normal in size. No mitral stenosis or regurgitation. No pulmonic regurgitation. Trivial tricuspid regurgitation. No pericardial effusion.  LHC (11/13/16):  Mid RCA lesion, 50 %stenosed. Diffuse stenosis.  Mid Cx lesion, 40  %stenosed. Diffuse stenosis.  Ost 2nd Mrg to 2nd Mrg lesion, 40 %stenosed.  Ost 2nd Diag to 2nd Diag lesion, 70 %stenosed.  The left ventricular systolic function is normal.  LV end diastolic pressure is normal.  The left ventricular ejection fraction is 55-65% by visual estimate.  There is no aortic valve stenosis.  Mid LAD lesion, 95 %stenosed. A STENT SYNERGY DES 3X32 drug eluting stent was successfully placed, postdilated to 3.6 mm.  Post intervention, there is a 0% residual stenosis.  LABS 01/02/17 IgE:  13 RAST Panel:  Negative   12/01/16  CBC: 8.5 / 11.0 / 32.8 / 462 Eos:  3    Assessment & Plan:  46 y.o. female with dyspnea that is likely multifactorial in etiology. I do question a component of asthma given her seasonal/environmental allergies. CT imaging of the sinuses does show some inflammation of the maxillary sinuses; however, I could not identify any specific allergens/antigens with serum testing. She does continue to have ongoing reflux which is likely contributing to some of her symptomatology. Additionally, with her underlying sleep apnea this could be adding to her constellation of symptoms. The severity of her sleep apnea is unclear given the remoteness of her testing and weight fluctuations since then. As such, we will need to reassess with split-night sleep study testing. I instructed the patient to contact my office if she had questions or concerns before her next appointment.  1. Dyspnea: Likely multifactorial. Deferring inhaler medications at this time. 2. GERD: Patient instructed to use Protonix in the morning before breakfast. Starting Zantac 150 mg by mouth daily at bedtime. 3. Chronic allergic rhinitis: Starting patient on Singulair 10 mg by mouth daily at bedtime. 4. OSA: Checking split-night sleep study. 5. Health maintenance: Patient declines immunizations. 6. Follow-up: Return to clinic in 3 months or sooner if needed.  Donna Christen Jamison Neighbor,  M.D. [facility] Pager:  [phone] After 3pm or if no response, call [phone] 2:31 PM 04/03/17

## 2017-04-03 NOTE — Progress Notes (Signed)
PFT done Today. 

## 2017-04-04 ENCOUNTER — Encounter (HOSPITAL_COMMUNITY): Payer: BLUE CROSS/BLUE SHIELD

## 2017-04-09 ENCOUNTER — Encounter (HOSPITAL_COMMUNITY): Payer: BLUE CROSS/BLUE SHIELD

## 2017-04-09 ENCOUNTER — Other Ambulatory Visit: Payer: BLUE CROSS/BLUE SHIELD

## 2017-04-11 ENCOUNTER — Encounter (HOSPITAL_COMMUNITY): Payer: BLUE CROSS/BLUE SHIELD

## 2017-04-16 ENCOUNTER — Encounter (HOSPITAL_COMMUNITY): Payer: BLUE CROSS/BLUE SHIELD

## 2017-04-18 ENCOUNTER — Encounter (HOSPITAL_COMMUNITY): Payer: BLUE CROSS/BLUE SHIELD

## 2017-04-23 ENCOUNTER — Encounter (HOSPITAL_COMMUNITY): Payer: BLUE CROSS/BLUE SHIELD

## 2017-04-25 ENCOUNTER — Encounter (HOSPITAL_COMMUNITY): Payer: BLUE CROSS/BLUE SHIELD

## 2017-04-28 ENCOUNTER — Encounter (HOSPITAL_BASED_OUTPATIENT_CLINIC_OR_DEPARTMENT_OTHER): Payer: BLUE CROSS/BLUE SHIELD

## 2017-04-30 ENCOUNTER — Encounter (HOSPITAL_COMMUNITY): Payer: BLUE CROSS/BLUE SHIELD

## 2017-04-30 DIAGNOSIS — G4733 Obstructive sleep apnea (adult) (pediatric): Secondary | ICD-10-CM | POA: Diagnosis not present

## 2017-05-01 DIAGNOSIS — G4733 Obstructive sleep apnea (adult) (pediatric): Secondary | ICD-10-CM | POA: Diagnosis not present

## 2017-05-02 ENCOUNTER — Encounter (HOSPITAL_COMMUNITY): Payer: BLUE CROSS/BLUE SHIELD

## 2017-05-04 ENCOUNTER — Telehealth (HOSPITAL_COMMUNITY): Payer: Self-pay

## 2017-05-04 ENCOUNTER — Other Ambulatory Visit: Payer: Self-pay | Admitting: *Deleted

## 2017-05-04 DIAGNOSIS — Z9989 Dependence on other enabling machines and devices: Principal | ICD-10-CM

## 2017-05-04 DIAGNOSIS — G4733 Obstructive sleep apnea (adult) (pediatric): Secondary | ICD-10-CM

## 2017-05-04 NOTE — Telephone Encounter (Signed)
Patient called back to return [facility]. Patient did not attend any rehab sessions in [place]. Scheduled orientation on 05/29/17 at 8:45am. Patient will attend the 1:15pm exc class.

## 2017-05-07 ENCOUNTER — Encounter (HOSPITAL_COMMUNITY): Payer: BLUE CROSS/BLUE SHIELD

## 2017-05-09 ENCOUNTER — Encounter (HOSPITAL_COMMUNITY): Payer: BLUE CROSS/BLUE SHIELD

## 2017-05-11 ENCOUNTER — Encounter: Payer: Self-pay | Admitting: Cardiology

## 2017-05-14 ENCOUNTER — Encounter (HOSPITAL_COMMUNITY): Payer: BLUE CROSS/BLUE SHIELD

## 2017-05-16 ENCOUNTER — Encounter (HOSPITAL_COMMUNITY): Payer: BLUE CROSS/BLUE SHIELD

## 2017-05-21 ENCOUNTER — Encounter (HOSPITAL_COMMUNITY): Payer: BLUE CROSS/BLUE SHIELD

## 2017-05-23 ENCOUNTER — Encounter (HOSPITAL_COMMUNITY): Payer: BLUE CROSS/BLUE SHIELD

## 2017-05-25 ENCOUNTER — Telehealth (HOSPITAL_COMMUNITY): Payer: Self-pay | Admitting: Pharmacist

## 2017-05-25 NOTE — Telephone Encounter (Signed)
Cardiac Rehab Medication Review by a Pharmacist  Does the patient  feel that his/her medications are working for him/her?  yes  Has the patient been experiencing any side effects to the medications prescribed?  Yes (described below)  Does the patient measure his/her own blood pressure or blood glucose at home?  Yes; blood sugar runs anywhere between 200 and 300  Does the patient have any problems obtaining medications due to transportation or finances?   no  Understanding of regimen: good Understanding of indications: good Potential of compliance: fair   Pharmacist comments: Ms. Cynthia Kirby is a pleasant 46 y/o F who returned my phone call about cardiac rehab for her medication history. She reports having significant fatigue with her medications and reports feeling like she might pass out. This dizziness/fatigue also occurs on occasion while sitting or lying down. I recommended that she follow up with her physician, mention it at cardiac rehab, and to used caution while standing up. She denies any falls.    Al CorpusLindsey Cecil Vandyke, PharmD PGY1 Pharmacy Resident Email: [email or web address]

## 2017-05-28 ENCOUNTER — Encounter (HOSPITAL_COMMUNITY): Payer: BLUE CROSS/BLUE SHIELD

## 2017-05-29 ENCOUNTER — Encounter (HOSPITAL_COMMUNITY): Payer: Self-pay

## 2017-05-29 ENCOUNTER — Telehealth: Payer: Self-pay | Admitting: *Deleted

## 2017-05-29 ENCOUNTER — Encounter (HOSPITAL_COMMUNITY)
Admission: RE | Admit: 2017-05-29 | Discharge: 2017-05-29 | Disposition: A | Discharge status: Home / Self Care | Payer: BLUE CROSS/BLUE SHIELD | Source: Ambulatory Visit | Attending: Cardiology | Admitting: Cardiology

## 2017-05-29 VITALS — BP 130/94 | HR 99 | Ht 65.0 in | Wt 231.9 lb

## 2017-05-29 DIAGNOSIS — G4730 Sleep apnea, unspecified: Secondary | ICD-10-CM | POA: Diagnosis not present

## 2017-05-29 DIAGNOSIS — I252 Old myocardial infarction: Secondary | ICD-10-CM | POA: Insufficient documentation

## 2017-05-29 DIAGNOSIS — I2510 Atherosclerotic heart disease of native coronary artery without angina pectoris: Secondary | ICD-10-CM | POA: Diagnosis not present

## 2017-05-29 DIAGNOSIS — E119 Type 2 diabetes mellitus without complications: Secondary | ICD-10-CM | POA: Diagnosis not present

## 2017-05-29 DIAGNOSIS — Z7982 Long term (current) use of aspirin: Secondary | ICD-10-CM | POA: Insufficient documentation

## 2017-05-29 DIAGNOSIS — I10 Essential (primary) hypertension: Secondary | ICD-10-CM | POA: Diagnosis not present

## 2017-05-29 DIAGNOSIS — I214 Non-ST elevation (NSTEMI) myocardial infarction: Secondary | ICD-10-CM

## 2017-05-29 DIAGNOSIS — M1990 Unspecified osteoarthritis, unspecified site: Secondary | ICD-10-CM | POA: Insufficient documentation

## 2017-05-29 DIAGNOSIS — F419 Anxiety disorder, unspecified: Secondary | ICD-10-CM | POA: Diagnosis not present

## 2017-05-29 DIAGNOSIS — E785 Hyperlipidemia, unspecified: Secondary | ICD-10-CM | POA: Diagnosis not present

## 2017-05-29 DIAGNOSIS — Z79899 Other long term (current) drug therapy: Secondary | ICD-10-CM | POA: Diagnosis not present

## 2017-05-29 DIAGNOSIS — E039 Hypothyroidism, unspecified: Secondary | ICD-10-CM | POA: Diagnosis not present

## 2017-05-29 DIAGNOSIS — Z955 Presence of coronary angioplasty implant and graft: Secondary | ICD-10-CM | POA: Insufficient documentation

## 2017-05-29 DIAGNOSIS — Z7951 Long term (current) use of inhaled steroids: Secondary | ICD-10-CM | POA: Diagnosis not present

## 2017-05-29 DIAGNOSIS — Z7984 Long term (current) use of oral hypoglycemic drugs: Secondary | ICD-10-CM | POA: Diagnosis not present

## 2017-05-29 DIAGNOSIS — K219 Gastro-esophageal reflux disease without esophagitis: Secondary | ICD-10-CM | POA: Diagnosis not present

## 2017-05-29 NOTE — Progress Notes (Signed)
Cardiac Individual Treatment Plan  Patient Details  Name: Cynthia Kirby MRN: [id number] Date of Birth: 20-Mar-1971 Referring Provider:     CARDIAC REHAB PHASE II ORIENTATION from 05/29/2017 in [facility] CARDIAC REHAB  Referring Provider  Armanda Magic, MD.      Initial Encounter Date:    CARDIAC REHAB PHASE II ORIENTATION from 05/29/2017 in [facility] CARDIAC REHAB  Date  05/29/17  Referring Provider  Armanda Magic, MD.      Visit Diagnosis: 11/12/16 NSTEMI (non-ST elevated myocardial infarction) (HCC)  11/13/16 Status post coronary artery stent placement  Patient's Home Medications on Admission:  Current Outpatient Medications:  .  amLODipine (NORVASC) 10 MG tablet, Take 1 tablet (10 mg total) by mouth daily., Disp: 90 tablet, Rfl: 3 .  Ascorbic Acid (VITAMIN C) 100 MG tablet, Take 100 mg by mouth daily., Disp: , Rfl:  .  aspirin 81 MG chewable tablet, Chew 1 tablet (81 mg total) by mouth daily., Disp: 30 tablet, Rfl: 11 .  cyanocobalamin 500 MCG tablet, Take 500 mcg by mouth daily., Disp: , Rfl:  .  Echinacea 125 MG CAPS, Take by mouth., Disp: , Rfl:  .  fluticasone (FLONASE) 50 MCG/ACT nasal spray, Place 2 sprays into both nostrils daily., Disp: , Rfl:  .  insulin glargine (LANTUS) 100 UNIT/ML injection, Inject 14 Units into the skin at bedtime., Disp: , Rfl:  .  losartan (COZAAR) 100 MG tablet, Take 1 tablet (100 mg total) by mouth daily., Disp: 90 tablet, Rfl: 3 .  metFORMIN (GLUCOPHAGE) 1000 MG tablet, Take 1,000 mg by mouth 2 (two) times daily with a meal. , Disp: , Rfl:  .  metoprolol succinate (TOPROL-XL) 50 MG 24 hr tablet, Take 50 mg by mouth daily. Take with or immediately following a meal., Disp: , Rfl:  .  montelukast (SINGULAIR) 10 MG tablet, Take 1 tablet (10 mg total) by mouth at bedtime., Disp: 30 tablet, Rfl: 3 .  Multiple Vitamin (MULTIVITAMIN) tablet, Take 1 tablet by mouth daily., Disp: , Rfl:  .  nitroGLYCERIN (NITROSTAT) 0.4 MG  SL tablet, Place 1 tablet (0.4 mg total) under the tongue every 5 (five) minutes x 3 doses as needed for chest pain., Disp: 25 tablet, Rfl: 3 .  ondansetron (ZOFRAN) 8 MG tablet, Take by mouth every 8 (eight) hours as needed for nausea or vomiting., Disp: , Rfl:  .  pantoprazole (PROTONIX) 40 MG tablet, Take 1 tablet by mouth 2 (two) times daily as needed. , Disp: , Rfl: 0 .  ranitidine (ZANTAC) 150 MG tablet, Take 1 tablet (150 mg total) by mouth 2 (two) times daily., Disp: 30 tablet, Rfl: 3 .  rosuvastatin (CRESTOR) 10 MG tablet, Take 1 tablet (10 mg total) by mouth daily., Disp: 90 tablet, Rfl: 3 .  saxagliptin HCl (ONGLYZA) 2.5 MG TABS tablet, Take 5 mg by mouth daily., Disp: , Rfl:  .  spironolactone (ALDACTONE) 25 MG tablet, Take 1 tablet (25 mg total) by mouth daily., Disp: 90 tablet, Rfl: 3 .  zinc sulfate 220 (50 Zn) MG capsule, Take 220 mg by mouth daily., Disp: , Rfl:   Past Medical History: Past Medical History:  Diagnosis Date  . Allergic rhinitis   . Anemia   . Anxiety   . Arthritis    "left ankle; knees" (11/13/2016)  . CAD in native artery 11/12/2016   A. NSTEMI with LHC 11/13/16 RCA 50%, Circ 40%, 2nd OM 40%, 2nd diag 70%, Mid LAD 95%- DES placed.  EF normal.  . Depression    "hx; not now" (11/13/2016)  . GERD (gastroesophageal reflux disease)   . Headache    "weekly" (11/13/2016)  . Hyperlipidemia   . Hypertension   . Hypothyroidism   . Migraine    "weekly" (11/13/2016)  . Sleep apnea    "CPAP RX'd; never used" (11/13/2016)  . Type II diabetes mellitus (HCC)     Tobacco Use: Social History   Tobacco Use  Smoking Status Passive Smoke Exposure - Never Smoker  Smokeless Tobacco Never Used  Tobacco Comment   friends and family.     Labs: Recent Review Flowsheet Data    Labs for ITP Cardiac and Pulmonary Rehab Latest Ref Rng & Units 11/13/2016 02/06/2017   Cholestrol 100 - 199 mg/dL 161(W) 960   LDLCALC 0 - 99 mg/dL 454(U) 85   HDL >98 mg/dL 11(B) 14(N)    Trlycerides 0 - 149 mg/dL 829(F) 621(H)   Hemoglobin A1c 4.8 - 5.6 % 10.5(H) -      Capillary Blood Glucose: Lab Results  Component Value Date   GLUCAP 220 (H) 03/07/2017   GLUCAP 260 (H) 03/05/2017   GLUCAP 284 (H) 02/21/2017   GLUCAP 190 (H) 02/19/2017   GLUCAP 246 (H) 02/14/2017     Exercise Target Goals: Date: 05/29/17  Exercise Program Goal: Individual exercise prescription set with THRR, safety & activity barriers. Participant demonstrates ability to understand and report RPE using BORG scale, to self-measure pulse accurately, and to acknowledge the importance of the exercise prescription.  Exercise Prescription Goal: Starting with aerobic activity 30 plus minutes a day, 3 days per week for initial exercise prescription. Provide home exercise prescription and guidelines that participant acknowledges understanding prior to discharge.  Activity Barriers & Risk Stratification: Activity Barriers & Cardiac Risk Stratification - 05/29/17 1132      Activity Barriers & Cardiac Risk Stratification   Activity Barriers  Back Problems    Comments  Bilateral knee pain, left ankle, right hip, lower back pain    Cardiac Risk Stratification  High       6 Minute Walk: 6 Minute Walk    Row Name 02/06/17 1638 05/29/17 1130       6 Minute Walk   Phase  Initial  Initial    Distance  1572 feet  1400 feet    Walk Time  6 minutes  6 minutes    # of Rest Breaks  0  0    MPH  2.98  2.65    METS  4.82  4.39    RPE  11  13    VO2 Peak  16.87  15.36    Symptoms  No  No    Resting HR  92 bpm  99 bpm    Resting BP  132/80  130/94    Resting Oxygen Saturation   -  99 %    Max Ex. HR  134 bpm  132 bpm    Max Ex. BP  162/78  154/94    2 Minute Post BP  140/80  120/80       Oxygen Initial Assessment:   Oxygen Re-Evaluation:   Oxygen Discharge (Final Oxygen Re-Evaluation):   Initial Exercise Prescription: Initial Exercise Prescription - 05/29/17 1100      Date of Initial  Exercise RX and Referring Provider   Date  05/29/17    Referring Provider  Armanda Magic, MD.      Treadmill   MPH  2.2  Grade  1    Minutes  15    METs  2.99      Recumbant Bike   Level  3    Minutes  10    METs  2.9      NuStep   Level  3    SPM  85    Minutes  15    METs  2.9      Prescription Details   Frequency (times per week)  3    Duration  Progress to 30 minutes of continuous aerobic without signs/symptoms of physical distress      Intensity   THRR 40-80% of Max Heartrate  70-141    Ratings of Perceived Exertion  11-13    Perceived Dyspnea  0-4      Progression   Progression  Continue progressive overload as per policy without signs/symptoms or physical distress.      Resistance Training   Training Prescription  Yes    Weight  3lbs    Reps  10-15       Perform Capillary Blood Glucose checks as needed.  Exercise Prescription Changes:  Exercise Prescription Changes    Row Name 02/21/17 1600 03/05/17 1700 03/14/17 1150         Response to Exercise   Blood Pressure (Admit)  146/82  132/86  140/60     Blood Pressure (Exercise)  154/100  160/80  138/88     Blood Pressure (Exit)  126/80  134/80  134/84     Heart Rate (Admit)  95 bpm  93 bpm  92 bpm     Heart Rate (Exercise)  121 bpm  117 bpm  122 bpm     Heart Rate (Exit)  94 bpm  96 bpm  91 bpm     Rating of Perceived Exertion (Exercise)  12  13  13      Symptoms  none  none  none     Comments  pt had a late start to exercise  -  -     Duration  Continue with 30 min of aerobic exercise without signs/symptoms of physical distress.  Continue with 30 min of aerobic exercise without signs/symptoms of physical distress.  Continue with 30 min of aerobic exercise without signs/symptoms of physical distress.     Intensity  THRR unchanged  THRR unchanged  THRR unchanged       Progression   Progression  Continue to progress workloads to maintain intensity without signs/symptoms of physical distress.  Continue  to progress workloads to maintain intensity without signs/symptoms of physical distress.  Continue to progress workloads to maintain intensity without signs/symptoms of physical distress.     Average METs  2.9  2.6  3.5       Resistance Training   Training Prescription  Yes  Yes  Yes     Weight  2lbs  3lbs  3lbs     Reps  10-15  10-15  10-15     Time  10 Minutes  10 Minutes  10 Minutes       Treadmill   MPH  2.2  2.2  2.5     Grade  1  2  3      Minutes  15  15  15      METs  2.99  3.29  3.95       NuStep   Level  -  3  3     SPM  -  75  80  Minutes  -  15  15     METs  -  2  3.1       Home Exercise Plan   Plans to continue exercise at  -  -  Home (comment) walking     Frequency  -  -  Add 3 additional days to program exercise sessions.     Initial Home Exercises Provided  -  -  03/07/17        Exercise Comments:  Exercise Comments    Row Name 03/06/17 1509 03/27/17 1144         Exercise Comments  Reviewed METs and goals. Pt is tolerating exercise well; will continue to monitor pt's progress and activity levels.  Pt completed 8 sessions of cardiac rehab at [facility], pt requested a transfer to [facility] [place] due to being closer to home. Pt will continue care and exercise program at [facility] cardiac [facility],m.         Exercise Goals and Review:  Exercise Goals    Row Name 02/06/17 8119 05/29/17 0943           Exercise Goals   Increase Physical Activity  Yes  Yes      Intervention  Provide advice, education, support and counseling about physical activity/exercise needs.;Develop an individualized exercise prescription for aerobic and resistive training based on initial evaluation findings, risk stratification, comorbidities and participant's personal goals.  Provide advice, education, support and counseling about physical activity/exercise needs.;Develop an individualized exercise prescription for aerobic and resistive training based on initial  evaluation findings, risk stratification, comorbidities and participant's personal goals.      Expected Outcomes  Achievement of increased cardiorespiratory fitness and enhanced flexibility, muscular endurance and strength shown through measurements of functional capacity and personal statement of participant.  Achievement of increased cardiorespiratory fitness and enhanced flexibility, muscular endurance and strength shown through measurements of functional capacity and personal statement of participant.      Increase Strength and Stamina  Yes improve breathing capacity.  Yes improve cardiovascular fitness and stamina. Develop exercise routine      Intervention  Provide advice, education, support and counseling about physical activity/exercise needs.;Develop an individualized exercise prescription for aerobic and resistive training based on initial evaluation findings, risk stratification, comorbidities and participant's personal goals.  Provide advice, education, support and counseling about physical activity/exercise needs.;Develop an individualized exercise prescription for aerobic and resistive training based on initial evaluation findings, risk stratification, comorbidities and participant's personal goals.      Expected Outcomes  Achievement of increased cardiorespiratory fitness and enhanced flexibility, muscular endurance and strength shown through measurements of functional capacity and personal statement of participant.  Achievement of increased cardiorespiratory fitness and enhanced flexibility, muscular endurance and strength shown through measurements of functional capacity and personal statement of participant.      Able to understand and use rate of perceived exertion (RPE) scale  -  Yes      Intervention  -  Provide education and explanation on how to use RPE scale      Expected Outcomes  -  Short Term: Able to use RPE daily in rehab to express subjective intensity level;Long Term:  Able to  use RPE to guide intensity level when exercising independently      Knowledge and understanding of Target Heart Rate Range (THRR)  -  Yes      Intervention  -  Provide education and explanation of THRR including how the numbers were  predicted and where they are located for reference      Expected Outcomes  -  Short Term: Able to state/look up THRR;Long Term: Able to use THRR to govern intensity when exercising independently;Short Term: Able to use daily as guideline for intensity in rehab      Able to check pulse independently  -  Yes      Intervention  -  Provide education and demonstration on how to check pulse in carotid and radial arteries.;Review the importance of being able to check your own pulse for safety during independent exercise      Expected Outcomes  -  Short Term: Able to explain why pulse checking is important during independent exercise;Long Term: Able to check pulse independently and accurately      Understanding of Exercise Prescription  -  Yes      Intervention  -  Provide education, explanation, and written materials on patient's individual exercise prescription      Expected Outcomes  -  Short Term: Able to explain program exercise prescription;Long Term: Able to explain home exercise prescription to exercise independently         Exercise Goals Re-Evaluation : Exercise Goals Re-Evaluation    Row Name 03/06/17 1507 03/07/17 1441           Exercise Goal Re-Evaluation   Exercise Goals Review  Increase Physical Activity;Able to understand and use rate of perceived exertion (RPE) scale;Knowledge and understanding of Target Heart Rate Range (THRR);Understanding of Exercise Prescription;Increase Strength and Stamina;Able to check pulse independently  Increase Physical Activity;Able to understand and use rate of perceived exertion (RPE) scale;Knowledge and understanding of Target Heart Rate Range (THRR);Increase Strength and Stamina;Able to check pulse independently;Understanding  of Exercise Prescription      Comments  Pt is tolerating WL increases fairly well; pt is currently walking 2.2/2 on treadmill  Reviewed home exercise with pt today.  Pt plans to walk for exercise, goal or target to get 5,000-7,000 steps per day.Reviewed THR, pulse, RPE, sign and symptoms, NTG use, and when to call 911 or MD.  Also discussed weather considerations and indoor options.  Pt voiced understanding.      Expected Outcomes  Pt will continue to improve in cardiorespiratory fitness  Pt will continue to improve in cardiorespiratory fitness          Discharge Exercise Prescription (Final Exercise Prescription Changes): Exercise Prescription Changes - 03/14/17 1150      Response to Exercise   Blood Pressure (Admit)  140/60    Blood Pressure (Exercise)  138/88    Blood Pressure (Exit)  134/84    Heart Rate (Admit)  92 bpm    Heart Rate (Exercise)  122 bpm    Heart Rate (Exit)  91 bpm    Rating of Perceived Exertion (Exercise)  13    Symptoms  none    Duration  Continue with 30 min of aerobic exercise without signs/symptoms of physical distress.    Intensity  THRR unchanged      Progression   Progression  Continue to progress workloads to maintain intensity without signs/symptoms of physical distress.    Average METs  3.5      Resistance Training   Training Prescription  Yes    Weight  3lbs    Reps  10-15    Time  10 Minutes      Treadmill   MPH  2.5    Grade  3    Minutes  15  METs  3.95      NuStep   Level  3    SPM  80    Minutes  15    METs  3.1      Home Exercise Plan   Plans to continue exercise at  Home (comment) walking    Frequency  Add 3 additional days to program exercise sessions.    Initial Home Exercises Provided  03/07/17       Nutrition:  Target Goals: Understanding of nutrition guidelines, daily intake of sodium 1500mg , cholesterol 200mg , calories 30% from fat and 7% or less from saturated fats, daily to have 5 or more servings of fruits and  vegetables.  Biometrics: Pre Biometrics - 05/29/17 1141      Pre Biometrics   Height  5\' 5"  (1.651 m)    Weight  231 lb 14.8 oz (105.2 kg)    Waist Circumference  42 inches    Hip Circumference  50 inches    Waist to Hip Ratio  0.84 %    BMI (Calculated)  38.59    Triceps Skinfold  47 mm    % Body Fat  48.1 %    Grip Strength  35 kg    Flexibility  11.5 in    Single Leg Stand  12.09 seconds      Post Biometrics - 03/27/17 1154       Post  Biometrics   Weight  225 lb 5 oz (102.2 kg)       Nutrition Therapy Plan and Nutrition Goals: Nutrition Therapy & Goals - 02/06/17 1450      Nutrition Therapy   Diet  Carb Modified, Therapeutic Lifestyle changes      Personal Nutrition Goals   Nutrition Goal  Pt to identify food quantities necessary to achieve weight loss of 6-24 lb (2.7-10.9 kg) at graduation from cardiac rehab. Initial goal wt of 199 lb desired.     Personal Goal #2  Pt to improve snack choices and portion sizes consumed.    Personal Goal #3  Improved glycemic control as evidenced by an improvement in A1c from 10.5 to less than 7.0      Intervention Plan   Intervention  Prescribe, educate and counsel regarding individualized specific dietary modifications aiming towards targeted core components such as weight, hypertension, lipid management, diabetes, heart failure and other comorbidities.    Expected Outcomes  Short Term Goal: Understand basic principles of dietary content, such as calories, fat, sodium, cholesterol and nutrients.;Long Term Goal: Adherence to prescribed nutrition plan.       Nutrition Discharge: Nutrition Scores: Nutrition Assessments - 02/06/17 1452      MEDFICTS Scores   Pre Score  24       Nutrition Goals Re-Evaluation:   Nutrition Goals Re-Evaluation:   Nutrition Goals Discharge (Final Nutrition Goals Re-Evaluation):   Psychosocial: Target Goals: Acknowledge presence or absence of significant depression and/or stress, maximize  coping skills, provide positive support system. Participant is able to verbalize types and ability to use techniques and skills needed for reducing stress and depression.  Initial Review & Psychosocial Screening: Initial Psych Review & Screening - 05/29/17 1243      Initial Review   Comments  recent illness has effected her self confidence.  Pt is earger to learn more about a heart healthy lifestyle      Family Dynamics   Good Support System?  Yes      Barriers   Psychosocial barriers to participate in program  The patient should benefit from training in stress management and relaxation.      Screening Interventions   Interventions  Provide feedback about the scores to participant;Encouraged to exercise       Quality of Life Scores: Quality of Life - 05/29/17 1145      Quality of Life Scores   Health/Function Pre  25 %    Socioeconomic Pre  30 %    Psych/Spiritual Pre  27.43 %    Family Pre  25.5 %    GLOBAL Pre  26.64 %       PHQ-9: Recent Review Flowsheet Data    Depression screen [facility] 2/9 02/12/2017   Decreased Interest 1   Down, Depressed, Hopeless 1   PHQ - 2 Score 2   Altered sleeping 1   Tired, decreased energy 1   Change in appetite 1   Feeling bad or failure about yourself  1   Trouble concentrating 0   Moving slowly or fidgety/restless 0   Suicidal thoughts 0   PHQ-9 Score 6   Difficult doing work/chores Somewhat difficult     Interpretation of Total Score  Total Score Depression Severity:  1-4 = Minimal depression, 5-9 = Mild depression, 10-14 = Moderate depression, 15-19 = Moderately severe depression, 20-27 = Severe depression   Psychosocial Evaluation and Intervention: Psychosocial Evaluation - 03/20/17 0734      Discharge Psychosocial Assessment & Intervention   Comments  pt transferring to [facility].,        Psychosocial Re-Evaluation: Psychosocial Re-Evaluation    Row Name 03/07/17 1709             Psychosocial  Re-Evaluation   Current issues with  Current Stress Concerns       Comments  pt with situational stress and health related anxiety.  pt displays improved mood and insight during CR sessions, pt has positive interactions with staff and peers. pt work conflict causes tardiness to CR sessions.        Expected Outcomes  pt will exhibit positive outlook with good coping skills.        Interventions  Stress management education;Relaxation education;Encouraged to attend Cardiac Rehabilitation for the exercise       Continue Psychosocial Services   No Follow up required          Psychosocial Discharge (Final Psychosocial Re-Evaluation): Psychosocial Re-Evaluation - 03/07/17 1709      Psychosocial Re-Evaluation   Current issues with  Current Stress Concerns    Comments  pt with situational stress and health related anxiety.  pt displays improved mood and insight during CR sessions, pt has positive interactions with staff and peers. pt work conflict causes tardiness to CR sessions.     Expected Outcomes  pt will exhibit positive outlook with good coping skills.     Interventions  Stress management education;Relaxation education;Encouraged to attend Cardiac Rehabilitation for the exercise    Continue Psychosocial Services   No Follow up required       Vocational Rehabilitation: Provide vocational rehab assistance to qualifying candidates.   Vocational Rehab Evaluation & Intervention: Vocational Rehab - 05/29/17 1242      Initial Vocational Rehab Evaluation & Intervention   Assessment shows need for Vocational Rehabilitation  No       Education: Education Goals: Education classes will be provided on a weekly basis, covering required topics. Participant will state understanding/return demonstration of topics presented.  Learning Barriers/Preferences: Learning Barriers/Preferences - 05/29/17 1151  Learning Barriers/Preferences   Printmaker        Education Topics: Count Your Pulse:  -Group instruction provided by verbal instruction, demonstration, patient participation and written materials to support subject.  Instructors address importance of being able to find your pulse and how to count your pulse when at home without a heart monitor.  Patients get hands on experience counting their pulse with staff help and individually.   Heart Attack, Angina, and Risk Factor Modification:  -Group instruction provided by verbal instruction, video, and written materials to support subject.  Instructors address signs and symptoms of angina and heart attacks.    Also discuss risk factors for heart disease and how to make changes to improve heart health risk factors.   Functional Fitness:  -Group instruction provided by verbal instruction, demonstration, patient participation, and written materials to support subject.  Instructors address safety measures for doing things around the house.  Discuss how to get up and down off the floor, how to pick things up properly, how to safely get out of a chair without assistance, and balance training.   Meditation and Mindfulness:  -Group instruction provided by verbal instruction, patient participation, and written materials to support subject.  Instructor addresses importance of mindfulness and meditation practice to help reduce stress and improve awareness.  Instructor also leads participants through a meditation exercise.    Stretching for Flexibility and Mobility:  -Group instruction provided by verbal instruction, patient participation, and written materials to support subject.  Instructors lead participants through series of stretches that are designed to increase flexibility thus improving mobility.  These stretches are additional exercise for major muscle groups that are typically performed during regular warm up and cool down.   Hands Only CPR:  -Group verbal, video, and participation provides  a basic overview of AHA guidelines for community CPR. Role-play of emergencies allow participants the opportunity to practice calling for help and chest compression technique with discussion of AED use.   Hypertension: -Group verbal and written instruction that provides a basic overview of hypertension including the most recent diagnostic guidelines, risk factor reduction with self-care instructions and medication management.    Nutrition I class: Heart Healthy Eating:  -Group instruction provided by PowerPoint slides, verbal discussion, and written materials to support subject matter. The instructor gives an explanation and review of the Therapeutic Lifestyle Changes diet recommendations, which includes a discussion on lipid goals, dietary fat, sodium, fiber, plant stanol/sterol esters, sugar, and the components of a well-balanced, healthy diet.   CARDIAC REHAB PHASE II EXERCISE from 03/05/2017 in [facility] CARDIAC REHAB  Date  03/05/17  Educator  RD  Instruction Review Code  Not applicable      Nutrition II class: Lifestyle Skills:  -Group instruction provided by PowerPoint slides, verbal discussion, and written materials to support subject matter. The instructor gives an explanation and review of label reading, grocery shopping for heart health, heart healthy recipe modifications, and ways to make healthier choices when eating out.   CARDIAC REHAB PHASE II EXERCISE from 03/05/2017 in [facility] CARDIAC REHAB  Date  03/05/17  Educator  RD  Instruction Review Code  Not applicable      Diabetes Question & Answer:  -Group instruction provided by PowerPoint slides, verbal discussion, and written materials to support subject matter. The instructor gives an explanation and review of diabetes co-morbidities, pre- and post-prandial blood glucose goals, pre-exercise blood glucose goals, signs,  symptoms, and treatment of hypoglycemia and hyperglycemia, and foot  care basics.   Diabetes Blitz:  -Group instruction provided by PowerPoint slides, verbal discussion, and written materials to support subject matter. The instructor gives an explanation and review of the physiology behind type 1 and type 2 diabetes, diabetes medications and rational behind using different medications, pre- and post-prandial blood glucose recommendations and Hemoglobin A1c goals, diabetes diet, and exercise including blood glucose guidelines for exercising safely.    CARDIAC REHAB PHASE II EXERCISE from 03/05/2017 in [facility] CARDIAC REHAB  Date  03/05/17  Educator  RD  Instruction Review Code  Not applicable      Portion Distortion:  -Group instruction provided by PowerPoint slides, verbal discussion, written materials, and food models to support subject matter. The instructor gives an explanation of serving size versus portion size, changes in portions sizes over the last 20 years, and what consists of a serving from each food group.   Stress Management:  -Group instruction provided by verbal instruction, video, and written materials to support subject matter.  Instructors review role of stress in heart disease and how to cope with stress positively.     Exercising on Your Own:  -Group instruction provided by verbal instruction, power point, and written materials to support subject.  Instructors discuss benefits of exercise, components of exercise, frequency and intensity of exercise, and end points for exercise.  Also discuss use of nitroglycerin and activating EMS.  Review options of places to exercise outside of rehab.  Review guidelines for sex with heart disease.   Cardiac Drugs I:  -Group instruction provided by verbal instruction and written materials to support subject.  Instructor reviews cardiac drug classes: antiplatelets, anticoagulants, beta blockers, and statins.  Instructor discusses reasons, side effects, and lifestyle considerations for  each drug class.   Cardiac Drugs II:  -Group instruction provided by verbal instruction and written materials to support subject.  Instructor reviews cardiac drug classes: angiotensin converting enzyme inhibitors (ACE-I), angiotensin II receptor blockers (ARBs), nitrates, and calcium channel blockers.  Instructor discusses reasons, side effects, and lifestyle considerations for each drug class.   Anatomy and Physiology of the Circulatory System:  Group verbal and written instruction and models provide basic cardiac anatomy and physiology, with the coronary electrical and arterial systems. Review of: AMI, Angina, Valve disease, Heart Failure, Peripheral Artery Disease, Cardiac Arrhythmia, Pacemakers, and the ICD.   Other Education:  -Group or individual verbal, written, or video instructions that support the educational goals of the cardiac rehab program.   Knowledge Questionnaire Score: Knowledge Questionnaire Score - 05/29/17 1144      Knowledge Questionnaire Score   Pre Score  22/28       Core Components/Risk Factors/Patient Goals at Admission: Personal Goals and Risk Factors at Admission - 05/29/17 1136      Core Components/Risk Factors/Patient Goals on Admission    Weight Management  Obesity;Weight Loss;Yes    Intervention  Weight Management/Obesity: Establish reasonable short term and long term weight goals.;Obesity: Provide education and appropriate resources to help participant work on and attain dietary goals.    Admit Weight  231 lb 14.8 oz (105.2 kg)    Goal Weight: Short Term  225 lb (102.1 kg)    Goal Weight: Long Term  200 lb (90.7 kg)    Expected Outcomes  Short Term: Continue to assess and modify interventions until short term weight is achieved;Long Term: Adherence to nutrition and physical activity/exercise program aimed toward attainment of established  weight goal;Weight Loss: Understanding of general recommendations for a balanced deficit meal plan, which promotes  1-2 lb weight loss per week and includes a negative energy balance of [phone] kcal/d    Diabetes  Yes    Intervention  Provide education about signs/symptoms and action to take for hypo/hyperglycemia.;Provide education about proper nutrition, including hydration, and aerobic/resistive exercise prescription along with prescribed medications to achieve blood glucose in normal ranges: Fasting glucose 65-99 mg/dL    Expected Outcomes  Long Term: Attainment of HbA1C < 7%.;Short Term: Participant verbalizes understanding of the signs/symptoms and immediate care of hyper/hypoglycemia, proper foot care and importance of medication, aerobic/resistive exercise and nutrition plan for blood glucose control.    Hypertension  Yes    Intervention  Provide education on lifestyle modifcations including regular physical activity/exercise, weight management, moderate sodium restriction and increased consumption of fresh fruit, vegetables, and low fat dairy, alcohol moderation, and smoking cessation.;Monitor prescription use compliance.    Expected Outcomes  Short Term: Continued assessment and intervention until BP is < 140/30mm HG in hypertensive participants. < 130/81mm HG in hypertensive participants with diabetes, heart failure or chronic kidney disease.;Long Term: Maintenance of blood pressure at goal levels.    Lipids  Yes    Intervention  Provide education and support for participant on nutrition & aerobic/resistive exercise along with prescribed medications to achieve LDL 70mg , HDL >40mg .    Expected Outcomes  Short Term: Participant states understanding of desired cholesterol values and is compliant with medications prescribed. Participant is following exercise prescription and nutrition guidelines.;Long Term: Cholesterol controlled with medications as prescribed, with individualized exercise RX and with personalized nutrition plan. Value goals: LDL < 70mg , HDL > 40 mg.    Stress  Yes    Intervention  Offer  individual and/or small group education and counseling on adjustment to heart disease, stress management and health-related lifestyle change. Teach and support self-help strategies.;Refer participants experiencing significant psychosocial distress to appropriate mental health specialists for further evaluation and treatment. When possible, include family members and significant others in education/counseling sessions.    Expected Outcomes  Short Term: Participant demonstrates changes in health-related behavior, relaxation and other stress management skills, ability to obtain effective social support, and compliance with psychotropic medications if prescribed.;Long Term: Emotional wellbeing is indicated by absence of clinically significant psychosocial distress or social isolation.       Core Components/Risk Factors/Patient Goals Review:  Goals and Risk Factor Review    Row Name 03/07/17 1704 03/20/17 0733           Core Components/Risk Factors/Patient Goals Review   Personal Goals Review  Weight Management/Obesity;Diabetes;Other  Weight Management/Obesity;Diabetes;Other      Review  pt with CAD RF demonstrates willingness to participate in CR exercise, nutrition and education opportunities. pt has work barriers that distract her ability to participate in CR sessions.   pt has requested transfer to [facility] Cardiac Rehab. pt scheduled to begin 03/21/2017.        Expected Outcomes  pt will participate in CR exercise, nutrition, diabetes control and lifestyle modifications to decrease overall RF.    pt will participate in CR exercise, nutrition, diabetes control and lifestyle modifications to decrease overall RF.           Core Components/Risk Factors/Patient Goals at Discharge (Final Review):  Goals and Risk Factor Review - 03/20/17 0733      Core Components/Risk Factors/Patient Goals Review   Personal Goals Review  Weight Management/Obesity;Diabetes;Other    Review  pt  has requested transfer  to [facility]. pt scheduled to begin 03/21/2017.      Expected Outcomes  pt will participate in CR exercise, nutrition, diabetes control and lifestyle modifications to decrease overall RF.         ITP Comments: ITP Comments    Row Name 02/06/17 0920 03/07/17 1703 05/29/17 1610       ITP Comments  Medical Director, Dr. Armanda Magic  Medical Director, Dr. Armanda Magic  Medical Director, Dr. Armanda Magic        Comments: Patient attended orientation from 0912 to 1108 to review rules and guidelines for program. Completed 6 minute walk test, Intitial ITP, and exercise prescription.  Telemetry-Sinus Rhythm, Sinus Tach .  Asymptomatic.Wrenn's entry blood pressure was 130/94 with a resting heart rate of 99. Blood pressure during walk test was noted at 154/94. With a maximum heart rate of 132. Echo did not take her blood pressure medications but took them during orientation today. Cordelia Pen RN at Dr Norris Cross office called and notified. Exit blood pressure 120/80 with a heart rate of 93.Will continue to monitor the patient throughout  the program.Maria Harlon Flor, RN,BSN 05/29/2017 12:50 PM

## 2017-05-29 NOTE — Telephone Encounter (Signed)
Called to inform office that patient came in today for cardiac rehab w/ elevated diastolic BP.   Resting BP was 130/94, HR 99 and pt had not taken her morning medications.  During walk test BP was 154/94. They have made her take her morning medications and will continue to monitor patient. Will not forward this message, but simply add to patient's chart in case BP does not improve.  Cardiac rehab will call back if issue remains/worsens.

## 2017-05-30 ENCOUNTER — Telehealth: Payer: Self-pay | Admitting: Cardiology

## 2017-05-30 ENCOUNTER — Encounter (HOSPITAL_COMMUNITY): Payer: BLUE CROSS/BLUE SHIELD

## 2017-05-30 NOTE — Telephone Encounter (Signed)
CANCEL/ERROR °

## 2017-06-04 ENCOUNTER — Encounter (HOSPITAL_COMMUNITY): Payer: BLUE CROSS/BLUE SHIELD

## 2017-06-04 ENCOUNTER — Encounter (HOSPITAL_COMMUNITY): Payer: Self-pay

## 2017-06-06 ENCOUNTER — Encounter (HOSPITAL_COMMUNITY): Payer: BLUE CROSS/BLUE SHIELD

## 2017-06-07 ENCOUNTER — Ambulatory Visit: Payer: BLUE CROSS/BLUE SHIELD | Admitting: Cardiology

## 2017-06-08 ENCOUNTER — Encounter (HOSPITAL_COMMUNITY): Payer: BLUE CROSS/BLUE SHIELD

## 2017-06-11 ENCOUNTER — Encounter (HOSPITAL_COMMUNITY): Payer: BLUE CROSS/BLUE SHIELD

## 2017-06-13 ENCOUNTER — Encounter (HOSPITAL_COMMUNITY): Payer: BLUE CROSS/BLUE SHIELD

## 2017-06-15 ENCOUNTER — Encounter (HOSPITAL_COMMUNITY): Payer: BLUE CROSS/BLUE SHIELD

## 2017-06-18 ENCOUNTER — Encounter (HOSPITAL_COMMUNITY): Payer: BLUE CROSS/BLUE SHIELD

## 2017-06-20 ENCOUNTER — Encounter (HOSPITAL_COMMUNITY): Payer: BLUE CROSS/BLUE SHIELD

## 2017-06-20 ENCOUNTER — Telehealth (HOSPITAL_COMMUNITY): Payer: Self-pay | Admitting: Cardiac Rehabilitation

## 2017-06-20 NOTE — Telephone Encounter (Signed)
pc to pt to assess reason for continue absence from cardiac rehab. Pt states she has been busy with work and personal phone calls. Pt also states [facility], however she has not followed up with MD. Pt with continued dizziness and reported "4 hours of amnesia".   Pt advised to notify PCP for further evaluation. Pt verbalized understanding.  Message sent to PCP for clearance to return to cardiac rehab.  Deveron FurlongJoann Rion, RN, BSN Cardiac Pulmonary Rehab 06/20/17  5:07 PM

## 2017-06-22 ENCOUNTER — Encounter (HOSPITAL_COMMUNITY)
Admission: RE | Admit: 2017-06-22 | Discharge: 2017-06-22 | Disposition: A | Discharge status: Home / Self Care | Payer: BLUE CROSS/BLUE SHIELD | Source: Ambulatory Visit | Attending: Cardiology | Admitting: Cardiology

## 2017-06-22 ENCOUNTER — Encounter: Payer: Self-pay | Admitting: Physician Assistant

## 2017-06-22 DIAGNOSIS — I214 Non-ST elevation (NSTEMI) myocardial infarction: Secondary | ICD-10-CM

## 2017-06-22 DIAGNOSIS — Z955 Presence of coronary angioplasty implant and graft: Secondary | ICD-10-CM

## 2017-06-22 NOTE — Progress Notes (Signed)
Cardiac Individual Treatment Plan  Patient Details  Name: Cynthia Kirby MRN: [id number] Date of Birth: [birth year] Referring Provider:     CARDIAC REHAB PHASE II ORIENTATION from 05/29/2017 in [facility] CARDIAC REHAB  Referring Provider  Armanda Magic, MD.      Initial Encounter Date:    CARDIAC REHAB PHASE II ORIENTATION from 05/29/2017 in [facility] CARDIAC REHAB  Date  05/29/17  Referring Provider  Armanda Magic, MD.      Visit Diagnosis: 11/12/16 NSTEMI (non-ST elevated myocardial infarction) (HCC)  11/13/16 Status post coronary artery stent placement  Patient's Home Medications on Admission:  Current Outpatient Medications:  .  amLODipine (NORVASC) 10 MG tablet, Take 1 tablet (10 mg total) by mouth daily., Disp: 90 tablet, Rfl: 3 .  Ascorbic Acid (VITAMIN C) 100 MG tablet, Take 100 mg by mouth daily., Disp: , Rfl:  .  aspirin 81 MG chewable tablet, Chew 1 tablet (81 mg total) by mouth daily., Disp: 30 tablet, Rfl: 11 .  cyanocobalamin 500 MCG tablet, Take 500 mcg by mouth daily., Disp: , Rfl:  .  Echinacea 125 MG CAPS, Take by mouth., Disp: , Rfl:  .  fluticasone (FLONASE) 50 MCG/ACT nasal spray, Place 2 sprays into both nostrils daily., Disp: , Rfl:  .  insulin glargine (LANTUS) 100 UNIT/ML injection, Inject 14 Units into the skin at bedtime., Disp: , Rfl:  .  losartan (COZAAR) 100 MG tablet, Take 1 tablet (100 mg total) by mouth daily., Disp: 90 tablet, Rfl: 3 .  metFORMIN (GLUCOPHAGE) 1000 MG tablet, Take 1,000 mg by mouth 2 (two) times daily with a meal. , Disp: , Rfl:  .  metoprolol succinate (TOPROL-XL) 50 MG 24 hr tablet, Take 50 mg by mouth daily. Take with or immediately following a meal., Disp: , Rfl:  .  montelukast (SINGULAIR) 10 MG tablet, Take 1 tablet (10 mg total) by mouth at bedtime., Disp: 30 tablet, Rfl: 3 .  Multiple Vitamin (MULTIVITAMIN) tablet, Take 1 tablet by mouth daily., Disp: , Rfl:  .  nitroGLYCERIN (NITROSTAT) 0.4 MG  SL tablet, Place 1 tablet (0.4 mg total) under the tongue every 5 (five) minutes x 3 doses as needed for chest pain., Disp: 25 tablet, Rfl: 3 .  ondansetron (ZOFRAN) 8 MG tablet, Take by mouth every 8 (eight) hours as needed for nausea or vomiting., Disp: , Rfl:  .  pantoprazole (PROTONIX) 40 MG tablet, Take 1 tablet by mouth 2 (two) times daily as needed. , Disp: , Rfl: 0 .  ranitidine (ZANTAC) 150 MG tablet, Take 1 tablet (150 mg total) by mouth 2 (two) times daily., Disp: 30 tablet, Rfl: 3 .  rosuvastatin (CRESTOR) 10 MG tablet, Take 1 tablet (10 mg total) by mouth daily., Disp: 90 tablet, Rfl: 3 .  saxagliptin HCl (ONGLYZA) 2.5 MG TABS tablet, Take 5 mg by mouth daily., Disp: , Rfl:  .  spironolactone (ALDACTONE) 25 MG tablet, Take 1 tablet (25 mg total) by mouth daily., Disp: 90 tablet, Rfl: 3 .  zinc sulfate 220 (50 Zn) MG capsule, Take 220 mg by mouth daily., Disp: , Rfl:   Past Medical History: Past Medical History:  Diagnosis Date  . Allergic rhinitis   . Anemia   . Anxiety   . Arthritis    "left ankle; knees" (11/13/2016)  . CAD in native artery 11/12/2016   A. NSTEMI with LHC 11/13/16 RCA 50%, Circ 40%, 2nd OM 40%, 2nd diag 70%, Mid LAD 95%- DES placed.  EF normal.  . Depression    "hx; not now" (11/13/2016)  . GERD (gastroesophageal reflux disease)   . Headache    "weekly" (11/13/2016)  . Hyperlipidemia   . Hypertension   . Hypothyroidism   . Migraine    "weekly" (11/13/2016)  . Sleep apnea    "CPAP RX'd; never used" (11/13/2016)  . Type II diabetes mellitus (HCC)     Tobacco Use: Social History   Tobacco Use  Smoking Status Passive Smoke Exposure - Never Smoker  Smokeless Tobacco Never Used  Tobacco Comment   friends and family.     Labs: Recent Review Flowsheet Data    Labs for ITP Cardiac and Pulmonary Rehab Latest Ref Rng & Units 11/13/2016 02/06/2017   Cholestrol 100 - 199 mg/dL 865(H) 846   LDLCALC 0 - 99 mg/dL 962(X) 85   HDL >52 mg/dL 84(X) 32(G)    Trlycerides 0 - 149 mg/dL 401(U) 272(Z)   Hemoglobin A1c 4.8 - 5.6 % 10.5(H) -      Capillary Blood Glucose: Lab Results  Component Value Date   GLUCAP 220 (H) 03/07/2017   GLUCAP 260 (H) 03/05/2017   GLUCAP 284 (H) 02/21/2017   GLUCAP 190 (H) 02/19/2017   GLUCAP 246 (H) 02/14/2017     Exercise Target Goals:    Exercise Program Goal: Individual exercise prescription set with THRR, safety & activity barriers. Participant demonstrates ability to understand and report RPE using BORG scale, to self-measure pulse accurately, and to acknowledge the importance of the exercise prescription.  Exercise Prescription Goal: Starting with aerobic activity 30 plus minutes a day, 3 days per week for initial exercise prescription. Provide home exercise prescription and guidelines that participant acknowledges understanding prior to discharge.  Activity Barriers & Risk Stratification: Activity Barriers & Cardiac Risk Stratification - 05/29/17 1132      Activity Barriers & Cardiac Risk Stratification   Activity Barriers  Back Problems    Comments  Bilateral knee pain, left ankle, right hip, lower back pain    Cardiac Risk Stratification  High       6 Minute Walk: 6 Minute Walk    Row Name 05/29/17 1130         6 Minute Walk   Phase  Initial     Distance  1400 feet     Walk Time  6 minutes     # of Rest Breaks  0     MPH  2.65     METS  4.39     RPE  13     VO2 Peak  15.36     Symptoms  No     Resting HR  99 bpm     Resting BP  130/94     Resting Oxygen Saturation   99 %     Max Ex. HR  132 bpm     Max Ex. BP  154/94     2 Minute Post BP  120/80        Oxygen Initial Assessment:   Oxygen Re-Evaluation:   Oxygen Discharge (Final Oxygen Re-Evaluation):   Initial Exercise Prescription: Initial Exercise Prescription - 05/29/17 1100      Date of Initial Exercise RX and Referring Provider   Date  05/29/17    Referring Provider  Armanda Magic, MD.      Treadmill    MPH  2.2    Grade  1    Minutes  15    METs  2.99  Recumbant Bike   Level  3    Minutes  10    METs  2.9      NuStep   Level  3    SPM  85    Minutes  15    METs  2.9      Prescription Details   Frequency (times per week)  3    Duration  Progress to 30 minutes of continuous aerobic without signs/symptoms of physical distress      Intensity   THRR 40-80% of Max Heartrate  70-141    Ratings of Perceived Exertion  11-13    Perceived Dyspnea  0-4      Progression   Progression  Continue progressive overload as per policy without signs/symptoms or physical distress.      Resistance Training   Training Prescription  Yes    Weight  3lbs    Reps  10-15       Perform Capillary Blood Glucose checks as needed.  Exercise Prescription Changes:   Exercise Comments:   Exercise Goals and Review:  Exercise Goals    Row Name 05/29/17 0943             Exercise Goals   Increase Physical Activity  Yes       Intervention  Provide advice, education, support and counseling about physical activity/exercise needs.;Develop an individualized exercise prescription for aerobic and resistive training based on initial evaluation findings, risk stratification, comorbidities and participant's personal goals.       Expected Outcomes  Achievement of increased cardiorespiratory fitness and enhanced flexibility, muscular endurance and strength shown through measurements of functional capacity and personal statement of participant.       Increase Strength and Stamina  Yes improve cardiovascular fitness and stamina. Develop exercise routine       Intervention  Provide advice, education, support and counseling about physical activity/exercise needs.;Develop an individualized exercise prescription for aerobic and resistive training based on initial evaluation findings, risk stratification, comorbidities and participant's personal goals.       Expected Outcomes  Achievement of increased  cardiorespiratory fitness and enhanced flexibility, muscular endurance and strength shown through measurements of functional capacity and personal statement of participant.       Able to understand and use rate of perceived exertion (RPE) scale  Yes       Intervention  Provide education and explanation on how to use RPE scale       Expected Outcomes  Short Term: Able to use RPE daily in rehab to express subjective intensity level;Long Term:  Able to use RPE to guide intensity level when exercising independently       Knowledge and understanding of Target Heart Rate Range (THRR)  Yes       Intervention  Provide education and explanation of THRR including how the numbers were predicted and where they are located for reference       Expected Outcomes  Short Term: Able to state/look up THRR;Long Term: Able to use THRR to govern intensity when exercising independently;Short Term: Able to use daily as guideline for intensity in rehab       Able to check pulse independently  Yes       Intervention  Provide education and demonstration on how to check pulse in carotid and radial arteries.;Review the importance of being able to check your own pulse for safety during independent exercise       Expected Outcomes  Short Term: Able to explain why pulse checking  is important during independent exercise;Long Term: Able to check pulse independently and accurately       Understanding of Exercise Prescription  Yes       Intervention  Provide education, explanation, and written materials on patient's individual exercise prescription       Expected Outcomes  Short Term: Able to explain program exercise prescription;Long Term: Able to explain home exercise prescription to exercise independently          Exercise Goals Re-Evaluation :    Discharge Exercise Prescription (Final Exercise Prescription Changes):   Nutrition:  Target Goals: Understanding of nutrition guidelines, daily intake of sodium 1500mg ,  cholesterol 200mg , calories 30% from fat and 7% or less from saturated fats, daily to have 5 or more servings of fruits and vegetables.  Biometrics: Pre Biometrics - 05/29/17 1141      Pre Biometrics   Height  5\' 5"  (1.651 m)    Weight  231 lb 14.8 oz (105.2 kg)    Waist Circumference  42 inches    Hip Circumference  50 inches    Waist to Hip Ratio  0.84 %    BMI (Calculated)  38.59    Triceps Skinfold  47 mm    % Body Fat  48.1 %    Grip Strength  35 kg    Flexibility  11.5 in    Single Leg Stand  12.09 seconds        Nutrition Therapy Plan and Nutrition Goals:   Nutrition Discharge: Nutrition Scores:   Nutrition Goals Re-Evaluation:   Nutrition Goals Re-Evaluation:   Nutrition Goals Discharge (Final Nutrition Goals Re-Evaluation):   Psychosocial: Target Goals: Acknowledge presence or absence of significant depression and/or stress, maximize coping skills, provide positive support system. Participant is able to verbalize types and ability to use techniques and skills needed for reducing stress and depression.  Initial Review & Psychosocial Screening: Initial Psych Review & Screening - 05/29/17 1243      Initial Review   Comments  recent illness has effected her self confidence.  Pt is earger to learn more about a heart healthy lifestyle      Family Dynamics   Good Support System?  Yes      Barriers   Psychosocial barriers to participate in program  The patient should benefit from training in stress management and relaxation.      Screening Interventions   Interventions  Provide feedback about the scores to participant;Encouraged to exercise       Quality of Life Scores: Quality of Life - 05/29/17 1145      Quality of Life Scores   Health/Function Pre  25 %    Socioeconomic Pre  30 %    Psych/Spiritual Pre  27.43 %    Family Pre  25.5 %    GLOBAL Pre  26.64 %       PHQ-9: Recent Review Flowsheet Data    Depression screen [facility] 02/12/2017    Decreased Interest 1   Down, Depressed, Hopeless 1   PHQ - 2 Score 2   Altered sleeping 1   Tired, decreased energy 1   Change in appetite 1   Feeling bad or failure about yourself  1   Trouble concentrating 0   Moving slowly or fidgety/restless 0   Suicidal thoughts 0   PHQ-9 Score 6   Difficult doing work/chores Somewhat difficult     Interpretation of Total Score  Total Score Depression Severity:  1-4 = Minimal depression, 5-9 =  Mild depression, 10-14 = Moderate depression, 15-19 = Moderately severe depression, 20-27 = Severe depression   Psychosocial Evaluation and Intervention:   Psychosocial Re-Evaluation:   Psychosocial Discharge (Final Psychosocial Re-Evaluation):   Vocational Rehabilitation: Provide vocational rehab assistance to qualifying candidates.   Vocational Rehab Evaluation & Intervention: Vocational Rehab - 05/29/17 1242      Initial Vocational Rehab Evaluation & Intervention   Assessment shows need for Vocational Rehabilitation  No       Education: Education Goals: Education classes will be provided on a weekly basis, covering required topics. Participant will state understanding/return demonstration of topics presented.  Learning Barriers/Preferences: Learning Barriers/Preferences - 05/29/17 1151      Learning Barriers/Preferences   Learning Barriers  Sight    Learning Preferences  Audio       Education Topics: Count Your Pulse:  -Group instruction provided by verbal instruction, demonstration, patient participation and written materials to support subject.  Instructors address importance of being able to find your pulse and how to count your pulse when at home without a heart monitor.  Patients get hands on experience counting their pulse with staff help and individually.   Heart Attack, Angina, and Risk Factor Modification:  -Group instruction provided by verbal instruction, video, and written materials to support subject.  Instructors  address signs and symptoms of angina and heart attacks.    Also discuss risk factors for heart disease and how to make changes to improve heart health risk factors.   Functional Fitness:  -Group instruction provided by verbal instruction, demonstration, patient participation, and written materials to support subject.  Instructors address safety measures for doing things around the house.  Discuss how to get up and down off the floor, how to pick things up properly, how to safely get out of a chair without assistance, and balance training.   Meditation and Mindfulness:  -Group instruction provided by verbal instruction, patient participation, and written materials to support subject.  Instructor addresses importance of mindfulness and meditation practice to help reduce stress and improve awareness.  Instructor also leads participants through a meditation exercise.    Stretching for Flexibility and Mobility:  -Group instruction provided by verbal instruction, patient participation, and written materials to support subject.  Instructors lead participants through series of stretches that are designed to increase flexibility thus improving mobility.  These stretches are additional exercise for major muscle groups that are typically performed during regular warm up and cool down.   Hands Only CPR:  -Group verbal, video, and participation provides a basic overview of AHA guidelines for community CPR. Role-play of emergencies allow participants the opportunity to practice calling for help and chest compression technique with discussion of AED use.   Hypertension: -Group verbal and written instruction that provides a basic overview of hypertension including the most recent diagnostic guidelines, risk factor reduction with self-care instructions and medication management.    Nutrition I class: Heart Healthy Eating:  -Group instruction provided by PowerPoint slides, verbal discussion, and written  materials to support subject matter. The instructor gives an explanation and review of the Therapeutic Lifestyle Changes diet recommendations, which includes a discussion on lipid goals, dietary fat, sodium, fiber, plant stanol/sterol esters, sugar, and the components of a well-balanced, healthy diet.   [facility]  Date  03/05/17  Educator  RD  Instruction Review Code  Not applicable      Nutrition II class: Lifestyle Skills:  -Group instruction  provided by PowerPoint slides, verbal discussion, and written materials to support subject matter. The instructor gives an explanation and review of label reading, grocery shopping for heart health, heart healthy recipe modifications, and ways to make healthier choices when eating out.   CARDIAC REHAB PHASE II EXERCISE from 03/05/2017 in [facility] CARDIAC REHAB  Date  03/05/17  Educator  RD  Instruction Review Code  Not applicable      Diabetes Question & Answer:  -Group instruction provided by PowerPoint slides, verbal discussion, and written materials to support subject matter. The instructor gives an explanation and review of diabetes co-morbidities, pre- and post-prandial blood glucose goals, pre-exercise blood glucose goals, signs, symptoms, and treatment of hypoglycemia and hyperglycemia, and foot care basics.   Diabetes Blitz:  -Group instruction provided by PowerPoint slides, verbal discussion, and written materials to support subject matter. The instructor gives an explanation and review of the physiology behind type 1 and type 2 diabetes, diabetes medications and rational behind using different medications, pre- and post-prandial blood glucose recommendations and Hemoglobin A1c goals, diabetes diet, and exercise including blood glucose guidelines for exercising safely.    CARDIAC REHAB PHASE II EXERCISE from 03/05/2017 in [facility] CARDIAC REHAB  Date  03/05/17  Educator  RD  Instruction Review Code  Not applicable      Portion Distortion:  -Group instruction provided by PowerPoint slides, verbal discussion, written materials, and food models to support subject matter. The instructor gives an explanation of serving size versus portion size, changes in portions sizes over the last 20 years, and what consists of a serving from each food group.   Stress Management:  -Group instruction provided by verbal instruction, video, and written materials to support subject matter.  Instructors review role of stress in heart disease and how to cope with stress positively.     Exercising on Your Own:  -Group instruction provided by verbal instruction, power point, and written materials to support subject.  Instructors discuss benefits of exercise, components of exercise, frequency and intensity of exercise, and end points for exercise.  Also discuss use of nitroglycerin and activating EMS.  Review options of places to exercise outside of rehab.  Review guidelines for sex with heart disease.   Cardiac Drugs I:  -Group instruction provided by verbal instruction and written materials to support subject.  Instructor reviews cardiac drug classes: antiplatelets, anticoagulants, beta blockers, and statins.  Instructor discusses reasons, side effects, and lifestyle considerations for each drug class.   Cardiac Drugs II:  -Group instruction provided by verbal instruction and written materials to support subject.  Instructor reviews cardiac drug classes: angiotensin converting enzyme inhibitors (ACE-I), angiotensin II receptor blockers (ARBs), nitrates, and calcium channel blockers.  Instructor discusses reasons, side effects, and lifestyle considerations for each drug class.   Anatomy and Physiology of the Circulatory System:  Group verbal and written instruction and models provide basic cardiac anatomy and physiology, with the  coronary electrical and arterial systems. Review of: AMI, Angina, Valve disease, Heart Failure, Peripheral Artery Disease, Cardiac Arrhythmia, Pacemakers, and the ICD.   Other Education:  -Group or individual verbal, written, or video instructions that support the educational goals of the cardiac rehab program.   Knowledge Questionnaire Score: Knowledge Questionnaire Score - 05/29/17 1144      Knowledge Questionnaire Score   Pre Score  22/28       Core Components/Risk Factors/Patient Goals at Admission: Personal Goals and Risk Factors at Admission - 05/29/17 1136  Core Components/Risk Factors/Patient Goals on Admission    Weight Management  Obesity;Weight Loss;Yes    Intervention  Weight Management/Obesity: Establish reasonable short term and long term weight goals.;Obesity: Provide education and appropriate resources to help participant work on and attain dietary goals.    Admit Weight  231 lb 14.8 oz (105.2 kg)    Goal Weight: Short Term  225 lb (102.1 kg)    Goal Weight: Long Term  200 lb (90.7 kg)    Expected Outcomes  Short Term: Continue to assess and modify interventions until short term weight is achieved;Long Term: Adherence to nutrition and physical activity/exercise program aimed toward attainment of established weight goal;Weight Loss: Understanding of general recommendations for a balanced deficit meal plan, which promotes 1-2 lb weight loss per week and includes a negative energy balance of [phone] kcal/d    Diabetes  Yes    Intervention  Provide education about signs/symptoms and action to take for hypo/hyperglycemia.;Provide education about proper nutrition, including hydration, and aerobic/resistive exercise prescription along with prescribed medications to achieve blood glucose in normal ranges: Fasting glucose 65-99 mg/dL    Expected Outcomes  Long Term: Attainment of HbA1C < 7%.;Short Term: Participant verbalizes understanding of the signs/symptoms and immediate  care of hyper/hypoglycemia, proper foot care and importance of medication, aerobic/resistive exercise and nutrition plan for blood glucose control.    Hypertension  Yes    Intervention  Provide education on lifestyle modifcations including regular physical activity/exercise, weight management, moderate sodium restriction and increased consumption of fresh fruit, vegetables, and low fat dairy, alcohol moderation, and smoking cessation.;Monitor prescription use compliance.    Expected Outcomes  Short Term: Continued assessment and intervention until BP is < 140/48mm HG in hypertensive participants. < 130/43mm HG in hypertensive participants with diabetes, heart failure or chronic kidney disease.;Long Term: Maintenance of blood pressure at goal levels.    Lipids  Yes    Intervention  Provide education and support for participant on nutrition & aerobic/resistive exercise along with prescribed medications to achieve LDL 70mg , HDL >40mg .    Expected Outcomes  Short Term: Participant states understanding of desired cholesterol values and is compliant with medications prescribed. Participant is following exercise prescription and nutrition guidelines.;Long Term: Cholesterol controlled with medications as prescribed, with individualized exercise RX and with personalized nutrition plan. Value goals: LDL < 70mg , HDL > 40 mg.    Stress  Yes    Intervention  Offer individual and/or small group education and counseling on adjustment to heart disease, stress management and health-related lifestyle change. Teach and support self-help strategies.;Refer participants experiencing significant psychosocial distress to appropriate mental health specialists for further evaluation and treatment. When possible, include family members and significant others in education/counseling sessions.    Expected Outcomes  Short Term: Participant demonstrates changes in health-related behavior, relaxation and other stress management skills,  ability to obtain effective social support, and compliance with psychotropic medications if prescribed.;Long Term: Emotional wellbeing is indicated by absence of clinically significant psychosocial distress or social isolation.       Core Components/Risk Factors/Patient Goals Review:    Core Components/Risk Factors/Patient Goals at Discharge (Final Review):    ITP Comments: ITP Comments    Row Name 05/29/17 0942 06/22/17 1154         ITP Comments  Medical Director, Dr. Armanda Magic  pt scheduled to begin group exercise classes. Multiple complicating factors have prevented her starting on scheduled start date including recent ER evaluation.  PCP clearance pending.  Comments:

## 2017-06-22 NOTE — Progress Notes (Addendum)
Cardiology Office Note    Date:  06/25/2017  ID:  Cynthia Kirby, DOB 10/22/1970, MRN [id number] PCP:  Heron Nay, PA  Cardiologist:  Dr. Mayford Knife - NEXT APPOINTMENT NEEDS TO BE WITH MD AS SHE HAS NOT SEEN DR. Mayford Knife SINCE INITIAL HOSPITALIZATION.   Chief Complaint: f/u CAD  History of Present Illness:  Cynthia Kirby is a 46 y.o. female with history of DM, HTN, severe anxiety, social phobia, panic attacks, hyperlipidemia, and CAD with NSTEMI s/p DES to mLAD 10/2016 who presents for follow-up.   She was admitted 11/13/16 for significant chest pressure and dyspnea in the setting of high blood pressure. Troponin was positive at 0.59 c/w NSTEMI. LHC 11/13/16 showed 95% mLAD s/p DES, otherwise residual 50% mRCa, 40% mCx, 40% OM2, 70% D2, EF 55-65%. She was started on Apirin and Brilinta. A1C was 10.5 and LDL 120 prompting recommendation for aggressive medical therapy. OP 2D echo 11/27/16: moderate LVH, EF 60-65%, grade 1 DD. She was readmitted 11/24/16 to [facility] with persistent SOB - per Epic, no objective cause was found, negative cardiac enzymes, CXR clear. It was felt this was due to Brilinta and so she was changed to Effient. Her Lipitor was changed to Crestor for unclear reasons. When I saw her at OV 11/29/16, she was continuing to note ongoing dyspnea and BP was elevated. Labs showed generally stable (and decreased Hgb) from prior - 11.0, TSH wnl, PBNP 134, Cr 1.03, K 4.1, d-dimer negative. Spironolactone was added. She was advised to avoid pregnancy given multiple incompatible meds and recent MI. At last 11/2016 she was continuing to report dyspnea and atypical CP. Dr. Mayford Knife recommended nuclear stress test to r/o progression of disease, which was normal 12/12/16. No clearcut cardiac cause was identified so she was referred to pulmonology who felt this was likely multifactorial with possible component of asthma. She historically arrives quite late to her cardiology appointments which she previously cited was  due to social anxiety. Last LDL 01/2017 was 85, prompting increase in Crestor with planned labs that never occurred. She apparently hadn't been taking her statin at that time due to nausea.   She arrives for f/u alone. She was seen in the [facility] ER 06/10/17 with dizziness and multiple episodes of altered consciousness like she is in the twilight zone, and 4 hours of amnesia. She had used 2 hits of THC and 2 drinks of alcohol the night prior and felt like it "sent her to [place]." This was out of character for her. No syncope. No obvious etiology was determined, labs showed Na 134, K 3.9, BUN 14, glucose 336, Cr 0.79, albumin 4, AST/ALT wnl, Hgb 11.6, plt 425, CXR normal. She was asked to f/u PCP which she has not done. She also has not followed up with endocrinology as she gets confused with the driving directions since their office is further than she'd like. From a cardiac perspective, she has dyspnea and poking chest discomfort associated with anxiety but no symptoms compatible with angina. She is getting back into cardiac rehab. She reports inconsistency of timing with BP meds. 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   Past Medical History:  Diagnosis Date  . Allergic rhinitis   . Anemia   . Anxiety   . Arthritis    "left ankle; knees" (11/13/2016)  . CAD in native artery 11/12/2016   A. NSTEMI with LHC 11/13/16 RCA 50%, Circ 40%, 2nd OM 40%,  2nd diag 70%, Mid LAD 95%- DES placed. EF normal. b. Repeat nuc 11/2016 was normal.  . Depression    "hx; not now" (11/13/2016)  . GERD (gastroesophageal reflux disease)   . Headache    "weekly" (11/13/2016)  . Hyperlipidemia   . Hypertension   . Hypothyroidism   . Migraine    "weekly" (11/13/2016)  . Sleep apnea    "CPAP RX'd; never used" (11/13/2016)  . Type II diabetes mellitus (HCC)     Past Surgical History:  Procedure Laterality Date  . CORONARY ANGIOPLASTY WITH STENT PLACEMENT   11/13/2016  . CORONARY STENT INTERVENTION N/A 11/13/2016   Procedure: Coronary Stent Intervention;  Surgeon: Corky CraftsVaranasi, Jayadeep S, MD;  [facility];  Service: Cardiovascular;  Laterality: N/A;  . INDUCED ABORTION    . LEFT HEART CATH AND CORONARY ANGIOGRAPHY N/A 11/13/2016   Procedure: Left Heart Cath and Coronary Angiography;  Surgeon: Corky CraftsVaranasi, Jayadeep S, MD;  [facility];  Service: Cardiovascular;  Laterality: N/A;  . TONSILLECTOMY AND ADENOIDECTOMY  2011    Current Medications:  Current Outpatient Medications:  .  amLODipine (NORVASC) 10 MG tablet, Take 1 tablet (10 mg total) by mouth daily., Disp: 90 tablet, Rfl: 3 .  aspirin 81 MG chewable tablet, Chew 1 tablet (81 mg total) by mouth daily., Disp: 30 tablet, Rfl: 11 .  cyanocobalamin 500 MCG tablet, Take 500 mcg by mouth daily., Disp: , Rfl:  .  Echinacea 125 MG CAPS, Take 1 capsule by mouth daily. , Disp: , Rfl:  .  fluticasone (FLONASE) 50 MCG/ACT nasal spray, Place 2 sprays into both nostrils daily., Disp: , Rfl:  .  insulin glargine (LANTUS) 100 UNIT/ML injection, Inject 14 Units into the skin at bedtime., Disp: , Rfl:  .  losartan (COZAAR) 100 MG tablet, Take 1 tablet (100 mg total) by mouth daily., Disp: 90 tablet, Rfl: 3 .  metFORMIN (GLUCOPHAGE) 1000 MG tablet, Take 1,000 mg by mouth 2 (two) times daily with a meal. , Disp: , Rfl:  .  metoprolol succinate (TOPROL-XL) 50 MG 24 hr tablet, Take 50 mg by mouth daily. Take with or immediately following a meal., Disp: , Rfl:  .  montelukast (SINGULAIR) 10 MG tablet, Take 1 tablet (10 mg total) by mouth at bedtime., Disp: 30 tablet, Rfl: 3 .  nitroGLYCERIN (NITROSTAT) 0.4 MG SL tablet, Place 1 tablet (0.4 mg total) under the tongue every 5 (five) minutes x 3 doses as needed for chest pain., Disp: 25 tablet, Rfl: 3 .  ondansetron (ZOFRAN) 8 MG tablet, Take 8 mg by mouth every 8 (eight) hours as needed for nausea or vomiting. , Disp: , Rfl:  .   pantoprazole (PROTONIX) 40 MG tablet, Take 1 tablet by mouth 2 (two) times daily as needed (acid reflux). , Disp: , Rfl: 0 .  ranitidine (ZANTAC) 150 MG tablet, Take 1 tablet (150 mg total) by mouth 2 (two) times daily., Disp: 30 tablet, Rfl: 3 .  saxagliptin HCl (ONGLYZA) 2.5 MG TABS tablet, Take 5 mg by mouth daily., Disp: , Rfl:  .  spironolactone (ALDACTONE) 25 MG tablet, Take 1 tablet (25 mg total) by mouth daily., Disp: 90 tablet, Rfl: 3 .  zinc sulfate 220 (50 Zn) MG capsule, Take 220 mg by mouth daily., Disp: , Rfl:  .  prasugrel (EFFIENT) 10 MG TABS tablet, Take 1 tablet (10 mg total) by mouth daily., Disp: 90 tablet, Rfl: 3 .  rosuvastatin (CRESTOR) 10 MG tablet, Take  1 tablet (10 mg total) by mouth daily., Disp: 90 tablet, Rfl: 3  Allergies:   Penicillins; Dapagliflozin; Orange oil; Lisinopril; and Penicillin g   Social History   Socioeconomic History  . Marital status: Single    Spouse name: None  . Number of children: None  . Years of education: None  . Highest education level: None  Social Needs  . Financial resource strain: Not hard at all  . Food insecurity - worry: Never true  . Food insecurity - inability: Never true  . Transportation needs - medical: No  . Transportation needs - non-medical: No  Occupational History  . Occupation: Network engineer of business, Print production planner  Tobacco Use  . Smoking status: Passive Smoke Exposure - Never Smoker  . Smokeless tobacco: Never Used  . Tobacco comment: friends and family.   Substance and Sexual Activity  . Alcohol use: Yes    Comment: 11/13/2016 "might have a couple drinks/year"  . Drug use: No  . Sexual activity: Yes    Birth control/protection: None  Other Topics Concern  . None  Social History Narrative   [facility] (01/02/17):   Originally from [place]. She moved to [facility] in [place]. She is a Scientist, research (life sciences). Her parents retired to [employer]. 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 She reports  remote exposure to a cockatiel & parakeets. She has questionable recent exposure to mold. No hot tub exposure. Enjoys writing music.      Family History:  Family History  Problem Relation Age of Onset  . Hypertension Mother   . Diabetes Mother   . Hypertension Father   . Diabetes Father        borderline  . Prostate cancer Father   . Healthy Sister   . Healthy Brother   . Lung disease Neg Hx      ROS:   Please see the history of present illness.  All other systems are reviewed and otherwise negative.    PHYSICAL EXAM:   VS:  BP (!) 146/90   Pulse 81   Ht 5\' 5"  (1.651 m)   Wt 234 lb (106.1 kg)   SpO2 98%   BMI 38.94 kg/m   BMI: Body mass index is 38.94 kg/m. GEN: Well nourished, well developed AAF in no acute distress  HEENT: normocephalic, atraumatic Neck: no JVD, carotid bruits, or masses Cardiac: RRR; no murmurs, rubs, or gallops, no edema  Respiratory:  clear to auscultation bilaterally, normal work of breathing GI: soft, nontender, nondistended, + BS MS: no deformity or atrophy  Skin: warm and dry, no rash Neuro:  Alert and Oriented x 3, Strength and sensation are intact, follows commands Psych: euthymic mood, full affect  Wt Readings from Last 3 Encounters:  06/25/17 234 lb (106.1 kg)  05/29/17 231 lb 14.8 oz (105.2 kg)  04/03/17 230 lb (104.3 kg)      Studies/Labs Reviewed:   EKG:  EKG was ordered today and personally reviewed by me and demonstrates NSR 81bpm, no acute ST-T changes, QTc .  Recent Labs: 11/13/2016: B Natriuretic Peptide 67.6 11/29/2016: NT-Pro BNP 134; TSH 2.130 12/01/2016: Hemoglobin 11.0; Platelets 462 12/06/2016: BUN 9; Creatinine, Ser 0.90; Potassium 4.4; Sodium 139 02/06/2017: ALT 8   Lipid Panel    Component Value Date/Time   CHOL 154 02/06/2017 1128   TRIG 155 (H) 02/06/2017 1128   HDL 38 (L) 02/06/2017 1128   CHOLHDL 4.1 02/06/2017 1128   CHOLHDL 5.3 11/13/2016  16100627   VLDL 49 (H) 11/13/2016 0627   LDLCALC 85 02/06/2017  1128    Additional studies/ records that were reviewed today include: Summarized above.    ASSESSMENT & PLAN:   1. CAD - she continues on ASA and Effient. Effient somehow fell off her med list but she affirms she has been taking this without interruption. Continue current regimen. Observe for recurrent angina (she has had rare dyspnea/chest poking ever since MI but this is in setting of anxiety only, and repeat nuc was normal for these symptoms earlier this year). No progressive complaints. Overall she feels she is doing well from this standpoint. 2. Essential HTN - BP elevated. Hard to know if she is fully compliant with meds as she does admit to occasionally missing doses and taking at different times. Discussed smart phone app reminders to help stay on track. Discussed titration of meds today but she prefers to get back on regular dosing before increasing. I asked her to check BP daily and call later this week with all readings, and to call in the interim if she is tending to see values of >130 systolic. She would be a candidate to titrate metoprolol next to 100mg  daily. 3. Hyperlipidemia - recheck LFTs/lipids today. Titrate statin if able to. 4. Dizziness/amnesia - she reports feeling like she was in a dream for several hours, feeling out of it, not being able to tell if she was urinating on a bedpan, not remembering what nursing staff was saying to her. I encouraged her to touch base with PCP as she may need to consider seeing neurology. I do suspect substance use the night prior contributed. Encouraged abstinence. Also asked her to avoid natural supplements like echinacea and licorice root due to potential interactions with cardiac meds.  Disposition: F/u with Dr. Mayford Knifeurner in 6 months - APPOINTMENT NEEDS TO BE WITH MD AS SHE HAS NOT SEEN DR. Mayford KnifeURNER SINCE INITIAL HOSPITALIZATION.  Medication Adjustments/Labs and Tests Ordered: Current medicines are reviewed at length with the patient today.   Concerns regarding medicines are outlined above. Medication changes, Labs and Tests ordered today are summarized above and listed in the Patient Instructions accessible in Encounters.   Signed, Laurann Montanaayna N Kaira Stringfield, PA-C  06/25/2017 9:18 AM    [facility] [address], [place], [place]  [address] Phone: [phone]; Fax: [phone]

## 2017-06-25 ENCOUNTER — Encounter: Payer: Self-pay | Admitting: Physician Assistant

## 2017-06-25 ENCOUNTER — Encounter (HOSPITAL_COMMUNITY): Payer: Self-pay

## 2017-06-25 ENCOUNTER — Encounter (HOSPITAL_COMMUNITY)
Admission: RE | Admit: 2017-06-25 | Discharge: 2017-06-25 | Disposition: A | Discharge status: Home / Self Care | Payer: BLUE CROSS/BLUE SHIELD | Source: Ambulatory Visit | Attending: Cardiology | Admitting: Cardiology

## 2017-06-25 ENCOUNTER — Encounter (INDEPENDENT_AMBULATORY_CARE_PROVIDER_SITE_OTHER): Payer: Self-pay

## 2017-06-25 ENCOUNTER — Ambulatory Visit (INDEPENDENT_AMBULATORY_CARE_PROVIDER_SITE_OTHER): Payer: BLUE CROSS/BLUE SHIELD | Admitting: Physician Assistant

## 2017-06-25 VITALS — BP 146/90 | HR 81 | Ht 65.0 in | Wt 234.0 lb

## 2017-06-25 DIAGNOSIS — I2510 Atherosclerotic heart disease of native coronary artery without angina pectoris: Secondary | ICD-10-CM | POA: Diagnosis not present

## 2017-06-25 DIAGNOSIS — R42 Dizziness and giddiness: Secondary | ICD-10-CM

## 2017-06-25 DIAGNOSIS — E785 Hyperlipidemia, unspecified: Secondary | ICD-10-CM

## 2017-06-25 DIAGNOSIS — Z955 Presence of coronary angioplasty implant and graft: Secondary | ICD-10-CM

## 2017-06-25 DIAGNOSIS — I214 Non-ST elevation (NSTEMI) myocardial infarction: Secondary | ICD-10-CM

## 2017-06-25 DIAGNOSIS — I10 Essential (primary) hypertension: Secondary | ICD-10-CM | POA: Diagnosis not present

## 2017-06-25 DIAGNOSIS — I252 Old myocardial infarction: Secondary | ICD-10-CM | POA: Diagnosis not present

## 2017-06-25 LAB — GLUCOSE, CAPILLARY
GLUCOSE-CAPILLARY: 292 mg/dL — AB (ref 65–99)
GLUCOSE-CAPILLARY: 325 mg/dL — AB (ref 65–99)

## 2017-06-25 LAB — HEPATIC FUNCTION PANEL
ALK PHOS: 77 IU/L (ref 39–117)
ALT: 12 IU/L (ref 0–32)
AST: 12 IU/L (ref 0–40)
Albumin: 4.1 g/dL (ref 3.5–5.5)
Bilirubin Total: <0.2 mg/dL (ref 0.0–1.2)
Bilirubin, Direct: 0.08 mg/dL (ref 0.00–0.40)
TOTAL PROTEIN: 6.6 g/dL (ref 6.0–8.5)

## 2017-06-25 LAB — LIPID PANEL
CHOLESTEROL TOTAL: 163 mg/dL (ref 100–199)
Chol/HDL Ratio: 4.7 ratio — ABNORMAL HIGH (ref 0.0–4.4)
HDL: 35 mg/dL — AB (ref >39)
LDL Calculated: 68 mg/dL (ref 0–99)
Triglycerides: 300 mg/dL — ABNORMAL HIGH (ref 0–149)
VLDL CHOLESTEROL CAL: 60 mg/dL — AB (ref 5–40)

## 2017-06-25 MED ORDER — PRASUGREL HCL 10 MG PO TABS: 10.0000 mg | ORAL_TABLET | Freq: Every day | ORAL | 3 refills | Status: DC

## 2017-06-25 NOTE — Patient Instructions (Signed)
Medication Instructions:  Your physician recommends that you continue on your current medications as directed. Please refer to the Current Medication list given to you today.   Labwork: TODAY:  LIPID & LFT  Testing/Procedures:  none orderec  Follow-Up: Your physician wants you to follow-up in: 6 MOTHS WITH DR. Sherlyn LickURNER   You will receive a reminder letter in the mail two months in advance. If you don't receive a letter, please call our office to schedule the follow-up appointment.   Any Other Special Instructions Will Be Listed Below (If Applicable).  Follow your blood pressure daily. If you see it is consistently running over 130 on the top #, call us. CALL OUR OFFICE IN 3 DAYS WITH ALL BLOOD PRESSURES READINGS    If you need a refill on your cardiac medications before your next appointment, please call your pharmacy.

## 2017-06-25 NOTE — Progress Notes (Signed)
Daily Session Note  Patient Details  Name: Cynthia Kirby MRN: [id number] Date of Birth: 20-Mar-1971 Referring Provider:     CARDIAC REHAB PHASE II ORIENTATION from 05/29/2017 in [facility]  Referring Provider  Fransico Him, MD.      Encounter Date: 06/25/2017  Check In: Session Check In - 06/25/17 1402      Check-In   Location  [facility]    Staff Present  Dorna Bloom, MS, ACSM RCEP, Exercise Physiologist;Kristan Votta, RN, Deland Pretty, MS, ACSM CEP, Exercise Physiologist    Supervising physician immediately available to respond to emergencies  [facility] immediately available    Physician(s)  Dr. Sarajane Jews    Medication changes reported      No    Fall or balance concerns reported     No    Tobacco Cessation  No Change    Warm-up and Cool-down  Performed as group-led instruction    Resistance Training Performed  Yes    VAD Patient?  No      Pain Assessment   Currently in Pain?  No/denies    Multiple Pain Sites  No       Capillary Blood Glucose: Results for orders placed or performed during the hospital encounter of 06/25/17 (from the past 24 hour(s))  Glucose, capillary     Status: Abnormal   Collection Time: 06/25/17  1:41 PM  Result Value Ref Range   Glucose-Capillary 292 (H) 65 - 99 mg/dL  Glucose, capillary     Status: Abnormal   Collection Time: 06/25/17  2:22 PM  Result Value Ref Range   Glucose-Capillary 325 (H) 65 - 99 mg/dL      Social History   Tobacco Use  Smoking Status Passive Smoke Exposure - Never Smoker  Smokeless Tobacco Never Used  Tobacco Comment   friends and family.     Goals Met:  Exercise tolerated well  Goals Unmet:  Not Applicable  Comments: Pt started cardiac rehab today.  Pt tolerated light exercise without difficulty. VSS, telemetry-sinus rhythm, asymptomatic.  Medication list reconciled. Pt denies barriers to medicaiton compliance.  PSYCHOSOCIAL ASSESSMENT:  PHQ-3. Pt  reports health related anxiety. Pt verbalizes desire to improve health risk factors including diabetes self management. Pt currently being followed by [facility], however pt interested in referral to Dr. Buddy Duty in [place] because it is closer. Pt advised to follow up with PCP as scheduled 06/27/2017 @ 2:30.  Written and verbal instructions given. Pt states she will try to reschedule that appointment to be able to attend cardiac rehab.  Pt encouraged MD follow up very important, cardiac rehab sessions can be rescheduled.  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   Pt encouraged to spend time focusing on herself and her health.  stress management, time management and relaxation techniques will be important.  Pt enjoys helping others as Programmer, systems.  Pt goals for cardiac rehab are weight loss, improve health, reduce risk factors and develop lifestyle regimen that includes self care practices.    Pt oriented to exercise equipment and routine.    Understanding verbalized.   Dr. Fransico Him is Medical Director for Cardiac Rehab at [facility].

## 2017-06-27 ENCOUNTER — Encounter (HOSPITAL_COMMUNITY)
Admission: RE | Admit: 2017-06-27 | Discharge: 2017-06-27 | Disposition: A | Discharge status: Home / Self Care | Payer: BLUE CROSS/BLUE SHIELD | Source: Ambulatory Visit | Attending: Cardiology | Admitting: Cardiology

## 2017-06-27 DIAGNOSIS — I2510 Atherosclerotic heart disease of native coronary artery without angina pectoris: Secondary | ICD-10-CM | POA: Diagnosis not present

## 2017-06-27 DIAGNOSIS — G4730 Sleep apnea, unspecified: Secondary | ICD-10-CM | POA: Diagnosis not present

## 2017-06-27 DIAGNOSIS — Z955 Presence of coronary angioplasty implant and graft: Secondary | ICD-10-CM | POA: Diagnosis not present

## 2017-06-27 DIAGNOSIS — F419 Anxiety disorder, unspecified: Secondary | ICD-10-CM | POA: Insufficient documentation

## 2017-06-27 DIAGNOSIS — Z7984 Long term (current) use of oral hypoglycemic drugs: Secondary | ICD-10-CM | POA: Diagnosis not present

## 2017-06-27 DIAGNOSIS — I10 Essential (primary) hypertension: Secondary | ICD-10-CM | POA: Insufficient documentation

## 2017-06-27 DIAGNOSIS — K219 Gastro-esophageal reflux disease without esophagitis: Secondary | ICD-10-CM | POA: Insufficient documentation

## 2017-06-27 DIAGNOSIS — Z7982 Long term (current) use of aspirin: Secondary | ICD-10-CM | POA: Insufficient documentation

## 2017-06-27 DIAGNOSIS — I214 Non-ST elevation (NSTEMI) myocardial infarction: Secondary | ICD-10-CM

## 2017-06-27 DIAGNOSIS — I252 Old myocardial infarction: Secondary | ICD-10-CM | POA: Insufficient documentation

## 2017-06-27 DIAGNOSIS — E039 Hypothyroidism, unspecified: Secondary | ICD-10-CM | POA: Diagnosis not present

## 2017-06-27 DIAGNOSIS — E785 Hyperlipidemia, unspecified: Secondary | ICD-10-CM | POA: Insufficient documentation

## 2017-06-27 DIAGNOSIS — Z7951 Long term (current) use of inhaled steroids: Secondary | ICD-10-CM | POA: Insufficient documentation

## 2017-06-27 DIAGNOSIS — E119 Type 2 diabetes mellitus without complications: Secondary | ICD-10-CM | POA: Diagnosis not present

## 2017-06-27 DIAGNOSIS — M1990 Unspecified osteoarthritis, unspecified site: Secondary | ICD-10-CM | POA: Insufficient documentation

## 2017-06-27 DIAGNOSIS — Z79899 Other long term (current) drug therapy: Secondary | ICD-10-CM | POA: Diagnosis not present

## 2017-06-27 LAB — GLUCOSE, CAPILLARY
GLUCOSE-CAPILLARY: 165 mg/dL — AB (ref 65–99)
GLUCOSE-CAPILLARY: 193 mg/dL — AB (ref 65–99)

## 2017-06-28 ENCOUNTER — Telehealth: Payer: Self-pay | Admitting: *Deleted

## 2017-06-28 DIAGNOSIS — E785 Hyperlipidemia, unspecified: Secondary | ICD-10-CM

## 2017-06-28 DIAGNOSIS — E876 Hypokalemia: Secondary | ICD-10-CM

## 2017-06-28 DIAGNOSIS — E049 Nontoxic goiter, unspecified: Secondary | ICD-10-CM

## 2017-06-28 DIAGNOSIS — R0602 Shortness of breath: Secondary | ICD-10-CM

## 2017-06-28 DIAGNOSIS — I10 Essential (primary) hypertension: Secondary | ICD-10-CM

## 2017-06-28 DIAGNOSIS — I2510 Atherosclerotic heart disease of native coronary artery without angina pectoris: Secondary | ICD-10-CM

## 2017-06-28 NOTE — Telephone Encounter (Signed)
-----   Message from Laurann Montanaayna N Dunn, [place]-C sent at 06/25/2017  4:57 PM EST ----- Please call patient. Labs show improving LDL compared to prior. She should continue rosuvastatin 10mg  daily. She did not tolerate higher doses due to nausea. Since LDL is OK for now, continue this regimen. When you click "rosuvastatin" on med list, it shows up as expired. Can you make sure this is renewed or that she has RX at home? Triglycerides still high likely due to poorly controlled diabetes. Really needs to get into see endocrinology again as discussed. Call us with BPs in several days as discussed. Dayna Dunn PA-C

## 2017-06-28 NOTE — Telephone Encounter (Signed)
Follow up ° ° °Patient is returning your call for lab results. ° ° ° °

## 2017-06-28 NOTE — Telephone Encounter (Signed)
Called pt re: lab results, Left her a message to call back.

## 2017-06-29 ENCOUNTER — Encounter (HOSPITAL_COMMUNITY): Payer: BLUE CROSS/BLUE SHIELD

## 2017-07-02 ENCOUNTER — Encounter (HOSPITAL_COMMUNITY)
Admission: RE | Admit: 2017-07-02 | Discharge: 2017-07-02 | Disposition: A | Discharge status: Home / Self Care | Payer: BLUE CROSS/BLUE SHIELD | Source: Ambulatory Visit | Attending: Cardiology | Admitting: Cardiology

## 2017-07-02 DIAGNOSIS — I214 Non-ST elevation (NSTEMI) myocardial infarction: Secondary | ICD-10-CM

## 2017-07-02 DIAGNOSIS — Z955 Presence of coronary angioplasty implant and graft: Secondary | ICD-10-CM

## 2017-07-02 DIAGNOSIS — I252 Old myocardial infarction: Secondary | ICD-10-CM | POA: Diagnosis not present

## 2017-07-02 LAB — GLUCOSE, CAPILLARY: Glucose-Capillary: 292 mg/dL — ABNORMAL HIGH (ref 65–99)

## 2017-07-02 MED ORDER — ROSUVASTATIN CALCIUM 10 MG PO TABS: 10.0000 mg | ORAL_TABLET | Freq: Every day | ORAL | 3 refills | Status: AC

## 2017-07-02 NOTE — Progress Notes (Signed)
Cynthia Kirby 47 y.o. female DOB: 08/15/1970 MRN: [id number]      Nutrition Note Dx: NSTEMI, s/p DES LAD Lab Results  Component Value Date   HGBA1C 10.5 (H) 11/13/2016  HGBA1C  9.0      12/20/2016       HGBA1C  10.5      03/29/2017  Note Spoke with pt. Pt recently started taking Guinea-Bissauresiba for better DM control. Per discussion, pt states CBG's are improved. Pt checks her CBG's 1-3 times daily. Pt feels she needs to check CBG's 3 times a day and MD has advised pt to check CBG's BID. Fasting CBG's 150-200's per pt. Recommended CBG ranges pre- and post-prandial discussed. 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 Sugar-free alternatives to honey discussed. Pt expressed understanding of the information reviewed. Pt aware of nutrition education classes offered.  Nutrition Diagnosis ? Food-and nutrition-related knowledge deficit related to lack of exposure to information as related to diagnosis of: ? CVD ? DM ? Obesity related to excessive energy intake as evidenced by a BMI of 38.1  Nutrition Intervention ? Pt given a handout re: points discussed during conversation.  ? Pt's individual nutrition plan and goals reviewed with pt.  Nutrition Goal(s):  ? Pt to identify food quantities necessary to achieve weight loss of 6-24 lb (2.7-10.9 kg) at graduation from cardiac rehab. Initial goal wt of 199 lb desired.  ? Pt to improve snack choices and portion sizes consumed. ? Improved glycemic control as evidenced by an improvement in A1c from 10.5 to less than 7.0  Plan:  Pt to attend nutrition classes ? Nutrition I ? Nutrition II ? Portion Distortion ? Diabetes Blitz ? Diabetes Q & A Will provide client-centered nutrition education as part of interdisciplinary care.   Monitor and evaluate progress toward nutrition goal with team.  Mickle PlumbEdna Liviana Mills, M.Ed, RD, LDN, CDE 07/02/2017 3:52 PM

## 2017-07-02 NOTE — Telephone Encounter (Signed)
-----   Message from Dayna N Dunn, PA-C sent at 06/25/2017  4:57 PM EST ----- Please call patient. Labs show improving LDL compared to prior. She should continue rosuvastatin 10mg daily. She did not tolerate higher doses due to nausea. Since LDL is OK for now, continue this regimen. When you click "rosuvastatin" on med list, it shows up as expired. Can you make sure this is renewed or that she has RX at home? Triglycerides still high likely due to poorly controlled diabetes. Really needs to get into see endocrinology again as discussed. Call us with BPs in several days as discussed. Dayna Dunn PA-C  

## 2017-07-04 ENCOUNTER — Encounter (HOSPITAL_COMMUNITY)
Admission: RE | Admit: 2017-07-04 | Discharge: 2017-07-04 | Disposition: A | Discharge status: Home / Self Care | Payer: BLUE CROSS/BLUE SHIELD | Source: Ambulatory Visit | Attending: Cardiology | Admitting: Cardiology

## 2017-07-04 DIAGNOSIS — I252 Old myocardial infarction: Secondary | ICD-10-CM | POA: Diagnosis not present

## 2017-07-04 DIAGNOSIS — I214 Non-ST elevation (NSTEMI) myocardial infarction: Secondary | ICD-10-CM

## 2017-07-04 DIAGNOSIS — Z955 Presence of coronary angioplasty implant and graft: Secondary | ICD-10-CM

## 2017-07-04 LAB — GLUCOSE, CAPILLARY: Glucose-Capillary: 320 mg/dL — ABNORMAL HIGH (ref 65–99)

## 2017-07-04 NOTE — Progress Notes (Addendum)
OUTPATIENT CARDIAC REHAB  PMH:  NSTEMI, DES LAD  Primary Cardiologist: Dr. Mayford Knifeurner  Pt unable to exercise due to hyperglycemia.  CBG-320.  Pt reports she has been using aloe vitamin supplements.  Pt reports she ate 3 eggs, toast, and jelly prior to rehab.  Pt also reports home CBG in 200's at home prior to coming to rehab. Pt notes she took tresiba and medications as prescribed.  Pt instructed to bring glucometer to next scheduled rehab session. Pt also instructed to notify PCP if CBG remains elevated. Phone call  to Tobie LordsLauren Young, PA to notify of hyperglycemic result.  Pt verbalized understanding.   Deveron FurlongJoann [place]andoah Yeats, RN, BSN Cardiac Pulmonary Rehab 07/04/17  4:22 PM

## 2017-07-06 ENCOUNTER — Encounter (HOSPITAL_COMMUNITY): Payer: BLUE CROSS/BLUE SHIELD

## 2017-07-09 ENCOUNTER — Encounter (HOSPITAL_COMMUNITY)
Admission: RE | Admit: 2017-07-09 | Discharge: 2017-07-09 | Disposition: A | Discharge status: Home / Self Care | Payer: BLUE CROSS/BLUE SHIELD | Source: Ambulatory Visit | Attending: Cardiology | Admitting: Cardiology

## 2017-07-09 DIAGNOSIS — I252 Old myocardial infarction: Secondary | ICD-10-CM | POA: Diagnosis not present

## 2017-07-09 DIAGNOSIS — I214 Non-ST elevation (NSTEMI) myocardial infarction: Secondary | ICD-10-CM

## 2017-07-09 DIAGNOSIS — Z955 Presence of coronary angioplasty implant and graft: Secondary | ICD-10-CM

## 2017-07-09 LAB — GLUCOSE, CAPILLARY
GLUCOSE-CAPILLARY: 227 mg/dL — AB (ref 65–99)
GLUCOSE-CAPILLARY: 217 mg/dL — AB (ref 65–99)

## 2017-07-09 NOTE — Progress Notes (Signed)
Reviewed home exercise with pt today.  Pt plans to walk for exercise. Pt plans to start with 15-20 minutes then slowly progress to 30 minutes. Reviewed THR, pulse, RPE, sign and symptoms, NTG use, and when to call 911 or MD.  Also discussed weather considerations and indoor options.  Pt voiced understanding.    Warrick ParisianAmber Lucky Alverson, MS, ACSM [employer] CorporationCEP

## 2017-07-10 ENCOUNTER — Telehealth (HOSPITAL_COMMUNITY): Payer: Self-pay | Admitting: Cardiac Rehabilitation

## 2017-07-10 NOTE — Telephone Encounter (Signed)
Late entry from 07/09/2017 Phone call received from [place] at [facility] who states pt diabetes being treated by Dr.John Robynn PaneLandis and [facility] [place].  Cynthia BumpsJessica states pt has requested transfer of care from their practice as she [place] to [place].  Pt states she asked for endocrine consult because she believes her PCP will not take care of her diabetes.  Pt encouraged to  Keep endocrine appointment as scheduled.  Cynthia FurlongJoann Dyer Klug, RN, BSN Cardiac Pulmonary Rehab 07/10/17  3:52 PM

## 2017-07-10 NOTE — Telephone Encounter (Signed)
pc received from pt she has cancelled [place] for today.  Pt states she did not have transportation to [place].  Pt states she is working with her PCP to re-establish with [facility] [place] and is awaiting appointment.  Pt states she has lost faith in her providers since her blood sugars are out of control since her MI. Pt is concerned her cardiac medications are causing hyperglycemia.  Pt verbalizes normal HGB A1C between 6-7. She is pleased that her last Hgb A1C was 9 down from 10.  Pt instructed to check CBG prior to coming to rehab and reminded of exercise parameters of CBG: 100-300 for exercise. Pt also instructed to make appointment with PCP in next 1-2 weeks if long wait to be seen by [facility].  Systemic effects of hyperglycemic reviewed with patient.  Pt verbalized understanding.  Deveron FurlongJoann Rion, RN, BSN Cardiac Pulmonary Rehab 07/10/17  /3:42 PM

## 2017-07-11 ENCOUNTER — Encounter (HOSPITAL_COMMUNITY)
Admission: RE | Admit: 2017-07-11 | Discharge: 2017-07-11 | Disposition: A | Discharge status: Home / Self Care | Payer: BLUE CROSS/BLUE SHIELD | Source: Ambulatory Visit | Attending: Cardiology | Admitting: Cardiology

## 2017-07-11 DIAGNOSIS — I214 Non-ST elevation (NSTEMI) myocardial infarction: Secondary | ICD-10-CM

## 2017-07-11 DIAGNOSIS — Z955 Presence of coronary angioplasty implant and graft: Secondary | ICD-10-CM

## 2017-07-11 DIAGNOSIS — I252 Old myocardial infarction: Secondary | ICD-10-CM | POA: Diagnosis not present

## 2017-07-11 LAB — GLUCOSE, CAPILLARY: Glucose-Capillary: 194 mg/dL — ABNORMAL HIGH (ref 65–99)

## 2017-07-13 ENCOUNTER — Encounter (HOSPITAL_COMMUNITY): Payer: BLUE CROSS/BLUE SHIELD

## 2017-07-16 ENCOUNTER — Telehealth: Payer: Self-pay | Admitting: Acute Care

## 2017-07-16 ENCOUNTER — Encounter (HOSPITAL_COMMUNITY): Payer: BLUE CROSS/BLUE SHIELD

## 2017-07-16 NOTE — Telephone Encounter (Signed)
Spoke with patient regarding SOB Pt reports SOB rest and with exertion, wheezing, dry cough, with chest tightness, and some chills. Pt states she has a hernia, and it is crushing her lungs Pt denies being on O2 and no cpap machine   Spoke with TP seeing if we could schedule appt for pt in tomorrow's schedule TP advised can be scheduled for 11am appt 07/17/17

## 2017-07-17 ENCOUNTER — Ambulatory Visit: Payer: BLUE CROSS/BLUE SHIELD | Admitting: Adult Health

## 2017-07-18 ENCOUNTER — Encounter (HOSPITAL_COMMUNITY)
Admission: RE | Admit: 2017-07-18 | Discharge: 2017-07-18 | Disposition: A | Discharge status: Home / Self Care | Payer: BLUE CROSS/BLUE SHIELD | Source: Ambulatory Visit | Attending: Cardiology | Admitting: Cardiology

## 2017-07-18 DIAGNOSIS — I214 Non-ST elevation (NSTEMI) myocardial infarction: Secondary | ICD-10-CM

## 2017-07-18 DIAGNOSIS — I252 Old myocardial infarction: Secondary | ICD-10-CM | POA: Diagnosis not present

## 2017-07-18 DIAGNOSIS — Z955 Presence of coronary angioplasty implant and graft: Secondary | ICD-10-CM

## 2017-07-18 LAB — GLUCOSE, CAPILLARY: Glucose-Capillary: 312 mg/dL — ABNORMAL HIGH (ref 65–99)

## 2017-07-18 NOTE — Progress Notes (Signed)
OUTPATIENT CARDIAC REHAB  PMH:  11/13/2016 NSTEMI, DES mLAD  Primary Cardiologist:  Dr. Mayford Knifeurner  Pt arrived at cardiac rehab with CBG-312.  Pt reports home CBG-263 prior to eating "plant smoothie"  Pt then took Insulin 18units.  Pt does admit to rotating injection sites. Pt unsure of pending endocrinology appointment.  Confirmed with [facility] her appointment is 08/08/2017 @9am .  Written and verbal instructions given. Pt  had appointment, date, time and location programmed in her cell phone.  Importance of maintaining controlled glucose level reviewed with patient. Pt instructed to rotate injection sites and bring home glucometer to next rehab session.  Pt instructed to increase PO water intake today and avoid exercise when CBG>300.  Pt also advised to make appointment with PCP ASAP if home readings continue >300.  Pt verbalized understanding.  Deveron FurlongJoann Rion, RN, BSN Cardiac Pulmonary Rehab 07/18/17  3:24 PM

## 2017-07-18 NOTE — Progress Notes (Signed)
Cardiac Individual Treatment Plan  Patient Details  Name: Cynthia Kirby MRN: [id number] Date of Birth: 03-24-1971 Referring Provider:     CARDIAC REHAB PHASE II ORIENTATION from 05/29/2017 in [facility] CARDIAC REHAB  Referring Provider  Armanda Magic, MD.      Initial Encounter Date:    CARDIAC REHAB PHASE II ORIENTATION from 05/29/2017 in [facility] CARDIAC REHAB  Date  05/29/17  Referring Provider  Armanda Magic, MD.      Visit Diagnosis: 11/12/16 NSTEMI (non-ST elevated myocardial infarction) (HCC)  11/13/16 Status post coronary artery stent placement  Patient's Home Medications on Admission:  Current Outpatient Medications:  .  amLODipine (NORVASC) 10 MG tablet, Take 1 tablet (10 mg total) by mouth daily., Disp: 90 tablet, Rfl: 3 .  aspirin 81 MG chewable tablet, Chew 1 tablet (81 mg total) by mouth daily., Disp: 30 tablet, Rfl: 11 .  cyanocobalamin 500 MCG tablet, Take 500 mcg by mouth daily., Disp: , Rfl:  .  Echinacea 125 MG CAPS, Take 1 capsule by mouth daily. , Disp: , Rfl:  .  fluticasone (FLONASE) 50 MCG/ACT nasal spray, Place 2 sprays into both nostrils daily., Disp: , Rfl:  .  insulin glargine (LANTUS) 100 UNIT/ML injection, Inject 14 Units into the skin at bedtime., Disp: , Rfl:  .  losartan (COZAAR) 100 MG tablet, Take 1 tablet (100 mg total) by mouth daily., Disp: 90 tablet, Rfl: 3 .  metFORMIN (GLUCOPHAGE) 1000 MG tablet, Take 1,000 mg by mouth 2 (two) times daily with a meal. , Disp: , Rfl:  .  metoprolol succinate (TOPROL-XL) 50 MG 24 hr tablet, Take 50 mg by mouth daily. Take with or immediately following a meal., Disp: , Rfl:  .  montelukast (SINGULAIR) 10 MG tablet, Take 1 tablet (10 mg total) by mouth at bedtime., Disp: 30 tablet, Rfl: 3 .  nitroGLYCERIN (NITROSTAT) 0.4 MG SL tablet, Place 1 tablet (0.4 mg total) under the tongue every 5 (five) minutes x 3 doses as needed for chest pain., Disp: 25 tablet, Rfl: 3 .  ondansetron  (ZOFRAN) 8 MG tablet, Take 8 mg by mouth every 8 (eight) hours as needed for nausea or vomiting. , Disp: , Rfl:  .  pantoprazole (PROTONIX) 40 MG tablet, Take 1 tablet by mouth 2 (two) times daily as needed (acid reflux). , Disp: , Rfl: 0 .  prasugrel (EFFIENT) 10 MG TABS tablet, Take 1 tablet (10 mg total) by mouth daily., Disp: 90 tablet, Rfl: 3 .  ranitidine (ZANTAC) 150 MG tablet, Take 1 tablet (150 mg total) by mouth 2 (two) times daily., Disp: 30 tablet, Rfl: 3 .  rosuvastatin (CRESTOR) 10 MG tablet, Take 1 tablet (10 mg total) by mouth daily., Disp: 90 tablet, Rfl: 3 .  saxagliptin HCl (ONGLYZA) 2.5 MG TABS tablet, Take 5 mg by mouth daily., Disp: , Rfl:  .  spironolactone (ALDACTONE) 25 MG tablet, Take 1 tablet (25 mg total) by mouth daily., Disp: 90 tablet, Rfl: 3 .  zinc sulfate 220 (50 Zn) MG capsule, Take 220 mg by mouth daily., Disp: , Rfl:   Past Medical History: Past Medical History:  Diagnosis Date  . Allergic rhinitis   . Anemia   . Anxiety   . Arthritis    "left ankle; knees" (11/13/2016)  . CAD in native artery 11/12/2016   A. NSTEMI with LHC 11/13/16 RCA 50%, Circ 40%, 2nd OM 40%, 2nd diag 70%, Mid LAD 95%- DES placed. EF normal. b. Repeat  nuc 11/2016 was normal.  . Depression    "hx; not now" (11/13/2016)  . GERD (gastroesophageal reflux disease)   . Headache    "weekly" (11/13/2016)  . Hyperlipidemia   . Hypertension   . Hypothyroidism   . Migraine    "weekly" (11/13/2016)  . Sleep apnea    "CPAP RX'd; never used" (11/13/2016)  . Type II diabetes mellitus (HCC)     Tobacco Use: Social History   Tobacco Use  Smoking Status Passive Smoke Exposure - Never Smoker  Smokeless Tobacco Never Used  Tobacco Comment   friends and family.     Labs: Recent Review Flowsheet Data    Labs for ITP Cardiac and Pulmonary Rehab Latest Ref Rng & Units 11/13/2016 02/06/2017 06/25/2017   Cholestrol 100 - 199 mg/dL 409(W208(H) [id number]   LDLCALC 0 - 99 mg/dL 829(F120(H) 85 68   HDL >62>39  mg/dL 13(Y39(L) 86(V38(L) 78(I35(L)   Trlycerides 0 - 149 mg/dL 696(E244(H) 952(W155(H) 413(K300(H)   Hemoglobin A1c 4.8 - 5.6 % 10.5(H) - -      Capillary Blood Glucose: Lab Results  Component Value Date   GLUCAP 312 (H) 07/18/2017   GLUCAP 194 (H) 07/11/2017   GLUCAP 217 (H) 07/09/2017   GLUCAP 227 (H) 07/09/2017   GLUCAP 320 (H) 07/04/2017     Exercise Target Goals:    Exercise Program Goal: Individual exercise prescription set using results from initial 6 min walk test and THRR while considering  patient's activity barriers and safety.   Exercise Prescription Goal: Initial exercise prescription builds to 30-45 minutes a day of aerobic activity, 2-3 days per week.  Home exercise guidelines will be given to patient during program as part of exercise prescription that the participant will acknowledge.  Activity Barriers & Risk Stratification: Activity Barriers & Cardiac Risk Stratification - 05/29/17 1132      Activity Barriers & Cardiac Risk Stratification   Activity Barriers  Back Problems    Comments  Bilateral knee pain, left ankle, right hip, lower back pain    Cardiac Risk Stratification  High       6 Minute Walk: 6 Minute Walk    Row Name 02/06/17 1638 05/29/17 1130       6 Minute Walk   Phase  Initial  Initial    Distance  1572 feet  1400 feet    Walk Time  6 minutes  6 minutes    # of Rest Breaks  0  0    MPH  2.98  2.65    METS  4.82  4.39    RPE  11  13    VO2 Peak  16.87  15.36    Symptoms  No  No    Resting HR  92 bpm  99 bpm    Resting BP  132/80  130/94    Resting Oxygen Saturation   -  99 %    Max Ex. HR  134 bpm  132 bpm    Max Ex. BP  162/78  154/94    2 Minute Post BP  140/80  120/80       Oxygen Initial Assessment:   Oxygen Re-Evaluation:   Oxygen Discharge (Final Oxygen Re-Evaluation):   Initial Exercise Prescription: Initial Exercise Prescription - 05/29/17 1100      Date of Initial Exercise RX and Referring Provider   Date  05/29/17    Referring  Provider  Armanda Magicurner, Traci, MD.      Treadmill   MPH  2.2    Grade  1    Minutes  15    METs  2.99      Recumbant Bike   Level  3    Minutes  10    METs  2.9      NuStep   Level  3    SPM  85    Minutes  15    METs  2.9      Prescription Details   Frequency (times per week)  3    Duration  Progress to 30 minutes of continuous aerobic without signs/symptoms of physical distress      Intensity   THRR 40-80% of Max Heartrate  70-141    Ratings of Perceived Exertion  11-13    Perceived Dyspnea  0-4      Progression   Progression  Continue progressive overload as per policy without signs/symptoms or physical distress.      Resistance Training   Training Prescription  Yes    Weight  3lbs    Reps  10-15       Perform Capillary Blood Glucose checks as needed.  Exercise Prescription Changes: Exercise Prescription Changes    Row Name 02/21/17 1600 03/05/17 1700 03/14/17 1150 07/02/17 1600 07/11/17 1021     Response to Exercise   Blood Pressure (Admit)  146/82  132/86  140/60  160/70  132/70   Blood Pressure (Exercise)  154/100  160/80  138/88  158/90  132/76   Blood Pressure (Exit)  126/80  134/80  134/84  140/80  120/60   Heart Rate (Admit)  95 bpm  93 bpm  92 bpm  96 bpm  99 bpm   Heart Rate (Exercise)  121 bpm  117 bpm  122 bpm  127 bpm  113 bpm   Heart Rate (Exit)  94 bpm  96 bpm  91 bpm  82 bpm  86 bpm   Rating of Perceived Exertion (Exercise)  12  13  13  13  13    Symptoms  none  none  none  none  none   Comments  pt had a late start to exercise  -  -  late start  late start   Duration  Continue with 30 min of aerobic exercise without signs/symptoms of physical distress.  Continue with 30 min of aerobic exercise without signs/symptoms of physical distress.  Continue with 30 min of aerobic exercise without signs/symptoms of physical distress.  Continue with 30 min of aerobic exercise without signs/symptoms of physical distress.  Continue with 30 min of aerobic exercise  without signs/symptoms of physical distress.   Intensity  THRR unchanged  THRR unchanged  THRR unchanged  THRR unchanged  THRR unchanged     Progression   Progression  Continue to progress workloads to maintain intensity without signs/symptoms of physical distress.  Continue to progress workloads to maintain intensity without signs/symptoms of physical distress.  Continue to progress workloads to maintain intensity without signs/symptoms of physical distress.  Continue to progress workloads to maintain intensity without signs/symptoms of physical distress.  Continue to progress workloads to maintain intensity without signs/symptoms of physical distress.   Average METs  2.9  2.6  3.5  2.7  3.1     Resistance Training   Training Prescription  Yes  Yes  Yes  Yes  Yes   Weight  2lbs  3lbs  3lbs  3lbs  4   Reps  10-15  10-15  10-15  10-15  10-15  Time  10 Minutes  10 Minutes  10 Minutes  10 Minutes  10 Minutes     Treadmill   MPH  2.2  2.2  2.5  2.2  2.2   Grade  1  2  3  1  1    Minutes  15  15  15  15  15    METs  2.99  3.29  3.95  2.99  2.99     NuStep   Level  -  3  3  3  4    SPM  -  75  80  75  75   Minutes  -  15  15  10  10    METs  -  2  3.1  2.5  3.2     Home Exercise Plan   Plans to continue exercise at  -  -  Home (comment) walking  -  Home (comment) walking   Frequency  -  -  Add 3 additional days to program exercise sessions.  -  Add 2 additional days to program exercise sessions.   Initial Home Exercises Provided  -  -  03/07/17  -  07/09/17      Exercise Comments: Exercise Comments    Row Name 03/06/17 1509 03/27/17 1144 07/02/17 1641 07/18/17 1237 07/18/17 1510   Exercise Comments  Reviewed METs and goals. Pt is tolerating exercise well; will continue to monitor pt's progress and activity levels.  Pt completed 8 sessions of cardiac rehab at [facility], pt requested a transfer to [facility] [place] due to being closer to home. Pt will continue care and exercise program at  [facility] cardiac rehab progra,m.  Pt resumed cardiac rehab sessions on 06/25/17. Pt was able to exercise without difficulty. Pt will continue to make progress and increase WL based on RPE, BP and HR or without signs/symptoms of physical distress.  Reviewed METs and goals. Pt is tolerating exercise fairly well and has better understanding of diabetes management. Pt will continue to progress activity as tolerated without signs/symptoms of physical distress.  Reviewed METs and goals. Pt is tolerating exercise fairly well however has challenges/ limitations due to poor diabetes management. Pt will continue to progress activity as tolerated without signs/symptoms of physical distress.      Exercise Goals and Review: Exercise Goals    Row Name 02/06/17 1610 05/29/17 0943           Exercise Goals   Increase Physical Activity  Yes  Yes      Intervention  Provide advice, education, support and counseling about physical activity/exercise needs.;Develop an individualized exercise prescription for aerobic and resistive training based on initial evaluation findings, risk stratification, comorbidities and participant's personal goals.  Provide advice, education, support and counseling about physical activity/exercise needs.;Develop an individualized exercise prescription for aerobic and resistive training based on initial evaluation findings, risk stratification, comorbidities and participant's personal goals.      Expected Outcomes  Achievement of increased cardiorespiratory fitness and enhanced flexibility, muscular endurance and strength shown through measurements of functional capacity and personal statement of participant.  Achievement of increased cardiorespiratory fitness and enhanced flexibility, muscular endurance and strength shown through measurements of functional capacity and personal statement of participant.      Increase Strength and Stamina  Yes improve breathing capacity.  Yes  improve cardiovascular fitness and stamina. Develop exercise routine      Intervention  Provide advice, education, support and counseling about physical activity/exercise needs.;Develop an individualized  exercise prescription for aerobic and resistive training based on initial evaluation findings, risk stratification, comorbidities and participant's personal goals.  Provide advice, education, support and counseling about physical activity/exercise needs.;Develop an individualized exercise prescription for aerobic and resistive training based on initial evaluation findings, risk stratification, comorbidities and participant's personal goals.      Expected Outcomes  Achievement of increased cardiorespiratory fitness and enhanced flexibility, muscular endurance and strength shown through measurements of functional capacity and personal statement of participant.  Achievement of increased cardiorespiratory fitness and enhanced flexibility, muscular endurance and strength shown through measurements of functional capacity and personal statement of participant.      Able to understand and use rate of perceived exertion (RPE) scale  -  Yes      Intervention  -  Provide education and explanation on how to use RPE scale      Expected Outcomes  -  Short Term: Able to use RPE daily in rehab to express subjective intensity level;Long Term:  Able to use RPE to guide intensity level when exercising independently      Knowledge and understanding of Target Heart Rate Range (THRR)  -  Yes      Intervention  -  Provide education and explanation of THRR including how the numbers were predicted and where they are located for reference      Expected Outcomes  -  Short Term: Able to state/look up THRR;Long Term: Able to use THRR to govern intensity when exercising independently;Short Term: Able to use daily as guideline for intensity in rehab      Able to check pulse independently  -  Yes      Intervention  -  Provide education  and demonstration on how to check pulse in carotid and radial arteries.;Review the importance of being able to check your own pulse for safety during independent exercise      Expected Outcomes  -  Short Term: Able to explain why pulse checking is important during independent exercise;Long Term: Able to check pulse independently and accurately      Understanding of Exercise Prescription  -  Yes      Intervention  -  Provide education, explanation, and written materials on patient's individual exercise prescription      Expected Outcomes  -  Short Term: Able to explain program exercise prescription;Long Term: Able to explain home exercise prescription to exercise independently         Exercise Goals Re-Evaluation : Exercise Goals Re-Evaluation    Row Name 03/06/17 1507 03/07/17 1441 07/09/17 1613 07/18/17 1239       Exercise Goal Re-Evaluation   Exercise Goals Review  Increase Physical Activity;Able to understand and use rate of perceived exertion (RPE) scale;Knowledge and understanding of Target Heart Rate Range (THRR);Understanding of Exercise Prescription;Increase Strength and Stamina;Able to check pulse independently  Increase Physical Activity;Able to understand and use rate of perceived exertion (RPE) scale;Knowledge and understanding of Target Heart Rate Range (THRR);Increase Strength and Stamina;Able to check pulse independently;Understanding of Exercise Prescription  -  -    Comments  Pt is tolerating WL increases fairly well; pt is currently walking 2.2/2 on treadmill  Reviewed home exercise with pt today.  Pt plans to walk for exercise, goal or target to get 5,000-7,000 steps per day.Reviewed THR, pulse, RPE, sign and symptoms, NTG use, and when to call 911 or MD.  Also discussed weather considerations and indoor options.  Pt voiced understanding.  Reviewed home exercise with pt today. Pt plans  to walk for exercise. Pt plans to start with 15-20 minutes then slowly progress to 30 minutes.  Reviewed THR, pulse, RPE, sign and symptoms, NTG use, and when to call 911 or MD.  Also discussed weather considerations and indoor options.  Pt voiced understanding.  Reviewed home exercise with pt today. Pt plans to walk for exercise. Pt plans to start with 15-20 minutes then slowly progress to 30 minutes. Reviewed THR, pulse, RPE, sign and symptoms, NTG use, and when to call 911 or MD.  Also discussed weather considerations and indoor options.  Pt voiced understanding.    Expected Outcomes  Pt will continue to improve in cardiorespiratory fitness  Pt will continue to improve in cardiorespiratory fitness  Pt will continue to improve in cardiorespiratory fitness  Pt will continue to improve in cardiorespiratory fitness        Discharge Exercise Prescription (Final Exercise Prescription Changes): Exercise Prescription Changes - 07/11/17 1021      Response to Exercise   Blood Pressure (Admit)  132/70    Blood Pressure (Exercise)  132/76    Blood Pressure (Exit)  120/60    Heart Rate (Admit)  99 bpm    Heart Rate (Exercise)  113 bpm    Heart Rate (Exit)  86 bpm    Rating of Perceived Exertion (Exercise)  13    Symptoms  none    Comments  late start    Duration  Continue with 30 min of aerobic exercise without signs/symptoms of physical distress.    Intensity  THRR unchanged      Progression   Progression  Continue to progress workloads to maintain intensity without signs/symptoms of physical distress.    Average METs  3.1      Resistance Training   Training Prescription  Yes    Weight  4    Reps  10-15    Time  10 Minutes      Treadmill   MPH  2.2    Grade  1    Minutes  15    METs  2.99      NuStep   Level  4    SPM  75    Minutes  10    METs  3.2      Home Exercise Plan   Plans to continue exercise at  Home (comment) walking    Frequency  Add 2 additional days to program exercise sessions.    Initial Home Exercises Provided  07/09/17       Nutrition:  Target Goals:  Understanding of nutrition guidelines, daily intake of sodium 1500mg , cholesterol 200mg , calories 30% from fat and 7% or less from saturated fats, daily to have 5 or more servings of fruits and vegetables.  Biometrics: Pre Biometrics - 05/29/17 1141      Pre Biometrics   Height  5\' 5"  (1.651 m)    Weight  231 lb 14.8 oz (105.2 kg)    Waist Circumference  42 inches    Hip Circumference  50 inches    Waist to Hip Ratio  0.84 %    BMI (Calculated)  38.59    Triceps Skinfold  47 mm    % Body Fat  48.1 %    Grip Strength  35 kg    Flexibility  11.5 in    Single Leg Stand  12.09 seconds      Post Biometrics - 03/27/17 1154       Post  Biometrics   Weight  225 lb 5 oz (102.2 kg)       Nutrition Therapy Plan and Nutrition Goals: Nutrition Therapy & Goals - 02/06/17 1450      Nutrition Therapy   Diet  Carb Modified, Therapeutic Lifestyle changes      Personal Nutrition Goals   Nutrition Goal  Pt to identify food quantities necessary to achieve weight loss of 6-24 lb (2.7-10.9 kg) at graduation from cardiac rehab. Initial goal wt of 199 lb desired.     Personal Goal #2  Pt to improve snack choices and portion sizes consumed.    Personal Goal #3  Improved glycemic control as evidenced by an improvement in A1c from 10.5 to less than 7.0      Intervention Plan   Intervention  Prescribe, educate and counsel regarding individualized specific dietary modifications aiming towards targeted core components such as weight, hypertension, lipid management, diabetes, heart failure and other comorbidities.    Expected Outcomes  Short Term Goal: Understand basic principles of dietary content, such as calories, fat, sodium, cholesterol and nutrients.;Long Term Goal: Adherence to prescribed nutrition plan.       Nutrition Assessments: Nutrition Assessments - 02/06/17 1452      MEDFICTS Scores   Pre Score  24       Nutrition Goals Re-Evaluation:   Nutrition Goals  Re-Evaluation:   Nutrition Goals Discharge (Final Nutrition Goals Re-Evaluation):   Psychosocial: Target Goals: Acknowledge presence or absence of significant depression and/or stress, maximize coping skills, provide positive support system. Participant is able to verbalize types and ability to use techniques and skills needed for reducing stress and depression.  Initial Review & Psychosocial Screening: Initial Psych Review & Screening - 05/29/17 1243      Initial Review   Comments  recent illness has effected her self confidence.  Pt is earger to learn more about a heart healthy lifestyle      Family Dynamics   Good Support System?  Yes      Barriers   Psychosocial barriers to participate in program  The patient should benefit from training in stress management and relaxation.      Screening Interventions   Interventions  Provide feedback about the scores to participant;Encouraged to exercise       Quality of Life Scores: Quality of Life - 05/29/17 1145      Quality of Life Scores   Health/Function Pre  25 %    Socioeconomic Pre  30 %    Psych/Spiritual Pre  27.43 %    Family Pre  25.5 %    GLOBAL Pre  26.64 %      Scores of 19 and below usually indicate a poorer quality of life in these areas.  A difference of  2-3 points is a clinically meaningful difference.  A difference of 2-3 points in the total score of the Quality of Life Index has been associated with significant improvement in overall quality of life, self-image, physical symptoms, and general health in studies assessing change in quality of life.  PHQ-9: Recent Review Flowsheet Data    Depression screen [facility] 2/9 06/25/2017 02/12/2017   Decreased Interest 0 1   Down, Depressed, Hopeless 0 1   PHQ - 2 Score 0 2   Altered sleeping 1 1   Tired, decreased energy 1 1   Change in appetite 1 1   Feeling bad or failure about yourself  - 1   Trouble concentrating 0 0   Moving slowly or fidgety/restless 0 0  Suicidal thoughts 0 0   PHQ-9 Score 3 6   Difficult doing work/chores Somewhat difficult Somewhat difficult     Interpretation of Total Score  Total Score Depression Severity:  1-4 = Minimal depression, 5-9 = Mild depression, 10-14 = Moderate depression, 15-19 = Moderately severe depression, 20-27 = Severe depression   Psychosocial Evaluation and Intervention: Psychosocial Evaluation - 06/25/17 1629      Psychosocial Evaluation & Interventions   Interventions  Stress management education;Relaxation education    Comments  pt exhibits health related anxiety,especially from life changes that occurred as result of recent cardiac event.  pt concerned about current health concerns.     Expected Outcomes  pt will exhibit improved outlook with good coping skills.     Continue Psychosocial Services   Follow up required by staff       Psychosocial Re-Evaluation: Psychosocial Re-Evaluation    Row Name 03/07/17 1709 07/18/17 1729           Psychosocial Re-Evaluation   Current issues with  Current Stress Concerns  Current Stress Concerns      Comments  pt with situational stress and health related anxiety.  pt displays improved mood and insight during CR sessions, pt has positive interactions with staff and peers. pt work conflict causes tardiness to CR sessions.   pt with situational stress and health related anxiety.  pt displays improved mood and insight during CR sessions, pt has positive interactions with staff and peers. pt work conflict causes tardiness to CR sessions.       Expected Outcomes  pt will exhibit positive outlook with good coping skills.   pt will exhibit positive outlook with good coping skills.       Interventions  Stress management education;Relaxation education;Encouraged to attend Cardiac Rehabilitation for the exercise  Stress management education;Relaxation education;Encouraged to attend Cardiac Rehabilitation for the exercise      Continue Psychosocial Services   No  Follow up required  No Follow up required         Psychosocial Discharge (Final Psychosocial Re-Evaluation): Psychosocial Re-Evaluation - 07/18/17 1729      Psychosocial Re-Evaluation   Current issues with  Current Stress Concerns    Comments  pt with situational stress and health related anxiety.  pt displays improved mood and insight during CR sessions, pt has positive interactions with staff and peers. pt work conflict causes tardiness to CR sessions.     Expected Outcomes  pt will exhibit positive outlook with good coping skills.     Interventions  Stress management education;Relaxation education;Encouraged to attend Cardiac Rehabilitation for the exercise    Continue Psychosocial Services   No Follow up required       Vocational Rehabilitation: Provide vocational rehab assistance to qualifying candidates.   Vocational Rehab Evaluation & Intervention: Vocational Rehab - 05/29/17 1242      Initial Vocational Rehab Evaluation & Intervention   Assessment shows need for Vocational Rehabilitation  No       Education: Education Goals: Education classes will be provided on a weekly basis, covering required topics. Participant will state understanding/return demonstration of topics presented.  Learning Barriers/Preferences: Learning Barriers/Preferences - 05/29/17 1151      Learning Barriers/Preferences   Learning Barriers  Sight    Learning Preferences  Audio       Education Topics: Count Your Pulse:  -Group instruction provided by verbal instruction, demonstration, patient participation and written materials to support subject.  Instructors address importance of being able to find  your pulse and how to count your pulse when at home without a heart monitor.  Patients get hands on experience counting their pulse with staff help and individually.   Heart Attack, Angina, and Risk Factor Modification:  -Group instruction provided by verbal instruction, video, and written  materials to support subject.  Instructors address signs and symptoms of angina and heart attacks.    Also discuss risk factors for heart disease and how to make changes to improve heart health risk factors.   Functional Fitness:  -Group instruction provided by verbal instruction, demonstration, patient participation, and written materials to support subject.  Instructors address safety measures for doing things around the house.  Discuss how to get up and down off the floor, how to pick things up properly, how to safely get out of a chair without assistance, and balance training.   Meditation and Mindfulness:  -Group instruction provided by verbal instruction, patient participation, and written materials to support subject.  Instructor addresses importance of mindfulness and meditation practice to help reduce stress and improve awareness.  Instructor also leads participants through a meditation exercise.    Stretching for Flexibility and Mobility:  -Group instruction provided by verbal instruction, patient participation, and written materials to support subject.  Instructors lead participants through series of stretches that are designed to increase flexibility thus improving mobility.  These stretches are additional exercise for major muscle groups that are typically performed during regular warm up and cool down.   Hands Only CPR:  -Group verbal, video, and participation provides a basic overview of AHA guidelines for community CPR. Role-play of emergencies allow participants the opportunity to practice calling for help and chest compression technique with discussion of AED use.   Hypertension: -Group verbal and written instruction that provides a basic overview of hypertension including the most recent diagnostic guidelines, risk factor reduction with self-care instructions and medication management.    Nutrition I class: Heart Healthy Eating:  -Group instruction provided by PowerPoint  slides, verbal discussion, and written materials to support subject matter. The instructor gives an explanation and review of the Therapeutic Lifestyle Changes diet recommendations, which includes a discussion on lipid goals, dietary fat, sodium, fiber, plant stanol/sterol esters, sugar, and the components of a well-balanced, healthy diet.   CARDIAC REHAB PHASE II EXERCISE from 03/05/2017 in [facility] CARDIAC REHAB  Date  03/05/17  Educator  RD  Instruction Review Code  Not applicable      Nutrition II class: Lifestyle Skills:  -Group instruction provided by PowerPoint slides, verbal discussion, and written materials to support subject matter. The instructor gives an explanation and review of label reading, grocery shopping for heart health, heart healthy recipe modifications, and ways to make healthier choices when eating out.   CARDIAC REHAB PHASE II EXERCISE from 03/05/2017 in [facility] CARDIAC REHAB  Date  03/05/17  Educator  RD  Instruction Review Code  Not applicable      Diabetes Question & Answer:  -Group instruction provided by PowerPoint slides, verbal discussion, and written materials to support subject matter. The instructor gives an explanation and review of diabetes co-morbidities, pre- and post-prandial blood glucose goals, pre-exercise blood glucose goals, signs, symptoms, and treatment of hypoglycemia and hyperglycemia, and foot care basics.   Diabetes Blitz:  -Group instruction provided by PowerPoint slides, verbal discussion, and written materials to support subject matter. The instructor gives an explanation and review of the physiology behind type 1 and type 2 diabetes, diabetes medications and  rational behind using different medications, pre- and post-prandial blood glucose recommendations and Hemoglobin A1c goals, diabetes diet, and exercise including blood glucose guidelines for exercising safely.    CARDIAC REHAB PHASE II EXERCISE  from 03/05/2017 in [facility] CARDIAC REHAB  Date  03/05/17  Educator  RD  Instruction Review Code  Not applicable      Portion Distortion:  -Group instruction provided by PowerPoint slides, verbal discussion, written materials, and food models to support subject matter. The instructor gives an explanation of serving size versus portion size, changes in portions sizes over the last 20 years, and what consists of a serving from each food group.   Stress Management:  -Group instruction provided by verbal instruction, video, and written materials to support subject matter.  Instructors review role of stress in heart disease and how to cope with stress positively.     Exercising on Your Own:  -Group instruction provided by verbal instruction, power point, and written materials to support subject.  Instructors discuss benefits of exercise, components of exercise, frequency and intensity of exercise, and end points for exercise.  Also discuss use of nitroglycerin and activating EMS.  Review options of places to exercise outside of rehab.  Review guidelines for sex with heart disease.   Cardiac Drugs I:  -Group instruction provided by verbal instruction and written materials to support subject.  Instructor reviews cardiac drug classes: antiplatelets, anticoagulants, beta blockers, and statins.  Instructor discusses reasons, side effects, and lifestyle considerations for each drug class.   Cardiac Drugs II:  -Group instruction provided by verbal instruction and written materials to support subject.  Instructor reviews cardiac drug classes: angiotensin converting enzyme inhibitors (ACE-I), angiotensin II receptor blockers (ARBs), nitrates, and calcium channel blockers.  Instructor discusses reasons, side effects, and lifestyle considerations for each drug class.   Anatomy and Physiology of the Circulatory System:  Group verbal and written instruction and models provide basic  cardiac anatomy and physiology, with the coronary electrical and arterial systems. Review of: AMI, Angina, Valve disease, Heart Failure, Peripheral Artery Disease, Cardiac Arrhythmia, Pacemakers, and the ICD.   Other Education:  -Group or individual verbal, written, or video instructions that support the educational goals of the cardiac rehab program.   Knowledge Questionnaire Score: Knowledge Questionnaire Score - 05/29/17 1144      Knowledge Questionnaire Score   Pre Score  22/28       Core Components/Risk Factors/Patient Goals at Admission: Personal Goals and Risk Factors at Admission - 05/29/17 1136      Core Components/Risk Factors/Patient Goals on Admission    Weight Management  Obesity;Weight Loss;Yes    Intervention  Weight Management/Obesity: Establish reasonable short term and long term weight goals.;Obesity: Provide education and appropriate resources to help participant work on and attain dietary goals.    Admit Weight  231 lb 14.8 oz (105.2 kg)    Goal Weight: Short Term  225 lb (102.1 kg)    Goal Weight: Long Term  200 lb (90.7 kg)    Expected Outcomes  Short Term: Continue to assess and modify interventions until short term weight is achieved;Long Term: Adherence to nutrition and physical activity/exercise program aimed toward attainment of established weight goal;Weight Loss: Understanding of general recommendations for a balanced deficit meal plan, which promotes 1-2 lb weight loss per week and includes a negative energy balance of [phone] kcal/d    Diabetes  Yes    Intervention  Provide education about signs/symptoms and action to take for hypo/hyperglycemia.;Provide  education about proper nutrition, including hydration, and aerobic/resistive exercise prescription along with prescribed medications to achieve blood glucose in normal ranges: Fasting glucose 65-99 mg/dL    Expected Outcomes  Long Term: Attainment of HbA1C < 7%.;Short Term: Participant verbalizes  understanding of the signs/symptoms and immediate care of hyper/hypoglycemia, proper foot care and importance of medication, aerobic/resistive exercise and nutrition plan for blood glucose control.    Hypertension  Yes    Intervention  Provide education on lifestyle modifcations including regular physical activity/exercise, weight management, moderate sodium restriction and increased consumption of fresh fruit, vegetables, and low fat dairy, alcohol moderation, and smoking cessation.;Monitor prescription use compliance.    Expected Outcomes  Short Term: Continued assessment and intervention until BP is < 140/75mm HG in hypertensive participants. < 130/74mm HG in hypertensive participants with diabetes, heart failure or chronic kidney disease.;Long Term: Maintenance of blood pressure at goal levels.    Lipids  Yes    Intervention  Provide education and support for participant on nutrition & aerobic/resistive exercise along with prescribed medications to achieve LDL 70mg , HDL >40mg .    Expected Outcomes  Short Term: Participant states understanding of desired cholesterol values and is compliant with medications prescribed. Participant is following exercise prescription and nutrition guidelines.;Long Term: Cholesterol controlled with medications as prescribed, with individualized exercise RX and with personalized nutrition plan. Value goals: LDL < 70mg , HDL > 40 mg.    Stress  Yes    Intervention  Offer individual and/or small group education and counseling on adjustment to heart disease, stress management and health-related lifestyle change. Teach and support self-help strategies.;Refer participants experiencing significant psychosocial distress to appropriate mental health specialists for further evaluation and treatment. When possible, include family members and significant others in education/counseling sessions.    Expected Outcomes  Short Term: Participant demonstrates changes in health-related  behavior, relaxation and other stress management skills, ability to obtain effective social support, and compliance with psychotropic medications if prescribed.;Long Term: Emotional wellbeing is indicated by absence of clinically significant psychosocial distress or social isolation.       Core Components/Risk Factors/Patient Goals Review:  Goals and Risk Factor Review    Row Name 03/07/17 1704 03/20/17 0733 07/18/17 1718         Core Components/Risk Factors/Patient Goals Review   Personal Goals Review  Weight Management/Obesity;Diabetes;Other  Weight Management/Obesity;Diabetes;Other  Weight Management/Obesity;Diabetes;Other     Review  pt with CAD RF demonstrates willingness to participate in CR exercise, nutrition and education opportunities. pt has work barriers that distract her ability to participate in CR sessions.   pt has requested transfer to [facility] Cardiac [facility]. pt scheduled to begin 03/21/2017.    pt demonstrates poor diabetes management with knowledge deficit of diabetes self care.   pt frequently unable to exercise due to hyperglycemia. pt has upcoming endocrinology consult 08/08/2017.      Expected Outcomes  pt will participate in CR exercise, nutrition, diabetes control and lifestyle modifications to decrease overall RF.    pt will participate in CR exercise, nutrition, diabetes control and lifestyle modifications to decrease overall RF.    pt will participate in CR exercise, nutrition, diabetes control and lifestyle modifications to decrease overall RF.          Core Components/Risk Factors/Patient Goals at Discharge (Final Review):  Goals and Risk Factor Review - 07/18/17 1718      Core Components/Risk Factors/Patient Goals Review   Personal Goals Review  Weight Management/Obesity;Diabetes;Other    Review  pt demonstrates poor  diabetes management with knowledge deficit of diabetes self care.   pt frequently unable to exercise due to hyperglycemia. pt has upcoming  endocrinology consult 08/08/2017.     Expected Outcomes  pt will participate in CR exercise, nutrition, diabetes control and lifestyle modifications to decrease overall RF.         ITP Comments: ITP Comments    Row Name 02/06/17 0920 03/07/17 1703 05/29/17 9147 06/22/17 1154 06/25/17 1618   ITP Comments  Medical Director, Dr. Armanda Magic  Medical Director, Dr. Armanda Magic  Medical Director, Dr. Armanda Magic  pt scheduled to begin group exercise classes. Multiple complicating factors have prevented her starting on scheduled start date including recent ER evaluation.  PCP clearance pending.   pt started group exercise class today. pt evaluated by cardiology today.      Comments:

## 2017-07-20 ENCOUNTER — Encounter (HOSPITAL_COMMUNITY): Payer: BLUE CROSS/BLUE SHIELD

## 2017-07-23 ENCOUNTER — Ambulatory Visit: Payer: BLUE CROSS/BLUE SHIELD | Admitting: Pulmonary Disease

## 2017-07-23 ENCOUNTER — Ambulatory Visit: Payer: BLUE CROSS/BLUE SHIELD | Admitting: Acute Care

## 2017-07-23 ENCOUNTER — Encounter (HOSPITAL_COMMUNITY)
Admission: RE | Admit: 2017-07-23 | Discharge: 2017-07-23 | Disposition: A | Discharge status: Home / Self Care | Payer: BLUE CROSS/BLUE SHIELD | Source: Ambulatory Visit | Attending: Cardiology | Admitting: Cardiology

## 2017-07-23 DIAGNOSIS — Z955 Presence of coronary angioplasty implant and graft: Secondary | ICD-10-CM

## 2017-07-23 DIAGNOSIS — I214 Non-ST elevation (NSTEMI) myocardial infarction: Secondary | ICD-10-CM

## 2017-07-23 DIAGNOSIS — I252 Old myocardial infarction: Secondary | ICD-10-CM | POA: Diagnosis not present

## 2017-07-23 LAB — GLUCOSE, CAPILLARY
GLUCOSE-CAPILLARY: 248 mg/dL — AB (ref 65–99)
GLUCOSE-CAPILLARY: 208 mg/dL — AB (ref 65–99)

## 2017-07-23 NOTE — Progress Notes (Deleted)
Subjective:   PATIENT ID: Cynthia Kirby GENDER: female DOB: 03-Jan-1971, MRN: [id number]  Synopsis: Former patient of Dr. Jamison Neighbor with Dyspnea, GERD, Chronic Allergic Rhinitis, & OSA    HPI  No chief complaint on file.   ***last visit split night  Past Medical History:  Diagnosis Date  . Allergic rhinitis   . Anemia   . Anxiety   . Arthritis    "left ankle; knees" (11/13/2016)  . CAD in native artery 11/12/2016   A. NSTEMI with LHC 11/13/16 RCA 50%, Circ 40%, 2nd OM 40%, 2nd diag 70%, Mid LAD 95%- DES placed. EF normal. b. Repeat nuc 11/2016 was normal.  . Depression    "hx; not now" (11/13/2016)  . GERD (gastroesophageal reflux disease)   . Headache    "weekly" (11/13/2016)  . Hyperlipidemia   . Hypertension   . Hypothyroidism   . Migraine    "weekly" (11/13/2016)  . Sleep apnea    "CPAP RX'd; never used" (11/13/2016)  . Type II diabetes mellitus (HCC)      Family History  Problem Relation Age of Onset  . Hypertension Mother   . Diabetes Mother   . Hypertension Father   . Diabetes Father        borderline  . Prostate cancer Father   . Healthy Sister   . Healthy Brother   . Lung disease Neg Hx      Social History   Socioeconomic History  . Marital status: Single    Spouse name: Not on file  . Number of children: Not on file  . Years of education: Not on file  . Highest education level: Not on file  Social Needs  . Financial resource strain: Not hard at all  . Food insecurity - worry: Never true  . Food insecurity - inability: Never true  . Transportation needs - medical: No  . Transportation needs - non-medical: No  Occupational History  . Occupation: Network engineer of business, Print production planner  Tobacco Use  . Smoking status: Passive Smoke Exposure - Never Smoker  . Smokeless tobacco: Never Used  . Tobacco comment: friends and family.   Substance and Sexual Activity  . Alcohol use: Yes    Comment: 11/13/2016 "might have a couple drinks/year"  . Drug use: No    . Sexual activity: Yes    Birth control/protection: None  Other Topics Concern  . Not on file  Social History Narrative   [facility] (01/02/17):   Originally from [place]. She moved to [facility] in 2013. She is a Scientist, research (life sciences). Her parents retired to [employer]. 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 She reports remote exposure to a cockatiel & parakeets. She has questionable recent exposure to mold. No hot tub exposure. Enjoys writing music.      Allergies  Allergen Reactions  . Penicillins Anaphylaxis and Itching  . Dapagliflozin     Other reaction(s): Other Genital mycotic infections  . Orange Oil Swelling    Tongue swelling.   . Lisinopril     Other reaction(s): COUGH  . Penicillin G Swelling     Outpatient Medications Prior to Visit  Medication Sig Dispense Refill  . amLODipine (NORVASC) 10 MG tablet Take 1 tablet (10 mg total) by mouth daily. 90 tablet 3  . aspirin 81 MG chewable tablet Chew 1 tablet (81 mg total) by mouth daily. 30 tablet 11  . cyanocobalamin 500 MCG tablet Take 500 mcg by  mouth daily.    . Echinacea 125 MG CAPS Take 1 capsule by mouth daily.     . fluticasone (FLONASE) 50 MCG/ACT nasal spray Place 2 sprays into both nostrils daily.    . insulin glargine (LANTUS) 100 UNIT/ML injection Inject 14 Units into the skin at bedtime.    Marland Kitchen losartan (COZAAR) 100 MG tablet Take 1 tablet (100 mg total) by mouth daily. 90 tablet 3  . metFORMIN (GLUCOPHAGE) 1000 MG tablet Take 1,000 mg by mouth 2 (two) times daily with a meal.     . metoprolol succinate (TOPROL-XL) 50 MG 24 hr tablet Take 50 mg by mouth daily. Take with or immediately following a meal.    . montelukast (SINGULAIR) 10 MG tablet Take 1 tablet (10 mg total) by mouth at bedtime. 30 tablet 3  . nitroGLYCERIN (NITROSTAT) 0.4 MG SL tablet Place 1 tablet (0.4 mg total) under the tongue every 5 (five) minutes x 3 doses as needed for chest pain. 25 tablet 3  . ondansetron (ZOFRAN) 8  MG tablet Take 8 mg by mouth every 8 (eight) hours as needed for nausea or vomiting.     . pantoprazole (PROTONIX) 40 MG tablet Take 1 tablet by mouth 2 (two) times daily as needed (acid reflux).   0  . prasugrel (EFFIENT) 10 MG TABS tablet Take 1 tablet (10 mg total) by mouth daily. 90 tablet 3  . ranitidine (ZANTAC) 150 MG tablet Take 1 tablet (150 mg total) by mouth 2 (two) times daily. 30 tablet 3  . rosuvastatin (CRESTOR) 10 MG tablet Take 1 tablet (10 mg total) by mouth daily. 90 tablet 3  . saxagliptin HCl (ONGLYZA) 2.5 MG TABS tablet Take 5 mg by mouth daily.    Marland Kitchen spironolactone (ALDACTONE) 25 MG tablet Take 1 tablet (25 mg total) by mouth daily. 90 tablet 3  . zinc sulfate 220 (50 Zn) MG capsule Take 220 mg by mouth daily.     No facility-administered medications prior to visit.     ROS    Objective:  Physical Exam   There were no vitals filed for this visit.  ***  CBC    Component Value Date/Time   WBC 8.5 12/01/2016 1708   WBC 9.6 11/14/2016 0222   RBC 4.02 12/01/2016 1708   RBC 3.95 11/14/2016 0222   HGB 11.0 (L) 12/01/2016 1708   HCT 32.8 (L) 12/01/2016 1708   PLT 462 (H) 12/01/2016 1708   MCV 82 12/01/2016 1708   MCH 27.4 12/01/2016 1708   MCH 27.8 11/14/2016 0222   MCHC 33.5 12/01/2016 1708   MCHC 34.4 11/14/2016 0222   RDW 14.2 12/01/2016 1708   LYMPHSABS 3.4 (H) 12/01/2016 1708   MONOABS 0.5 11/13/2016 0627   EOSABS 0.2 12/01/2016 1708   BASOSABS 0.1 12/01/2016 1708     PFT 04/03/17: FVC 3.06 L (78%) FEV1 2.69 L (86%) FEV1/FVC 0.88 FEF 25-75 3.66 L (118%) negative bronchodilator response TLC 4.29 L (80%) RV 80% ERV 20% DLCO corrected 78%  02/13/17: Walked 408 meters / Baseline Sat 100% on RA / Nadir Sat 100% on RA (had chest discomfort during test that resolved w/ rest)  IMAGING ESOPHAGRAM/BARIUM SWALLOW 01/09/17 :  Negative esophagram aside from a small gastric hiatal hernia, significance of which is doubtful.  MAXILLOFACIAL CT LTD W/O  01/08/17:  Mild mucosal edema maxillary sinus bilaterally  CXR PA/LAT 11/24/16:  No parenchymal mass or opacity appreciated. No pleural effusion. Heart normal in size & mediastinum normal in  contour.  CT CHEST W/ CONTRAST 03/27/15:  No parenchymal nodule, mass, or opacity appreciated. Normal airways. No pleural effusion or thickening. No pericardial effusion. No pathologic mediastinal adenopathy. Small hiatal hernia noted. Hepatic steatosis present on limited upper abdominal windows. Also some scoliosis is present.  CARDIAC TTE (11/27/16):  LV normal in size with moderate LVH. EF 60-65% with grade 1 diastolic dysfunction. LA & RA normal in size. RV normal in size and function. No aortic stenosis or regurgitation. Aortic root normal in size. No mitral stenosis or regurgitation. No pulmonic regurgitation. Trivial tricuspid regurgitation. No pericardial effusion.  LHC (11/13/16):  Mid RCA lesion, 50 %stenosed. Diffuse stenosis.  Mid Cx lesion, 40 %stenosed. Diffuse stenosis.  Ost 2nd Mrg to 2nd Mrg lesion, 40 %stenosed.  Ost 2nd Diag to 2nd Diag lesion, 70 %stenosed.  The left ventricular systolic function is normal.  LV end diastolic pressure is normal.  The left ventricular ejection fraction is 55-65% by visual estimate.  There is no aortic valve stenosis.  Mid LAD lesion, 95 %stenosed. A STENT SYNERGY DES [id number] drug eluting stent was successfully placed, postdilated to 3.6 mm.  Post intervention, there is a 0% residual stenosis.  LABS 01/02/17 IgE:  13 RAST Panel:  Negative   12/01/16   CBC: 8.5 / 11.0 / 32.8 / 462 Eos:  3       Assessment & Plan:   No diagnosis found.  Discussion: ***    Current Outpatient Medications:  .  amLODipine (NORVASC) 10 MG tablet, Take 1 tablet (10 mg total) by mouth daily., Disp: 90 tablet, Rfl: 3 .  aspirin 81 MG chewable tablet, Chew 1 tablet (81 mg total) by mouth daily., Disp: 30 tablet, Rfl: 11 .  cyanocobalamin 500 MCG tablet, Take  500 mcg by mouth daily., Disp: , Rfl:  .  Echinacea 125 MG CAPS, Take 1 capsule by mouth daily. , Disp: , Rfl:  .  fluticasone (FLONASE) 50 MCG/ACT nasal spray, Place 2 sprays into both nostrils daily., Disp: , Rfl:  .  insulin glargine (LANTUS) 100 UNIT/ML injection, Inject 14 Units into the skin at bedtime., Disp: , Rfl:  .  losartan (COZAAR) 100 MG tablet, Take 1 tablet (100 mg total) by mouth daily., Disp: 90 tablet, Rfl: 3 .  metFORMIN (GLUCOPHAGE) 1000 MG tablet, Take 1,000 mg by mouth 2 (two) times daily with a meal. , Disp: , Rfl:  .  metoprolol succinate (TOPROL-XL) 50 MG 24 hr tablet, Take 50 mg by mouth daily. Take with or immediately following a meal., Disp: , Rfl:  .  montelukast (SINGULAIR) 10 MG tablet, Take 1 tablet (10 mg total) by mouth at bedtime., Disp: 30 tablet, Rfl: 3 .  nitroGLYCERIN (NITROSTAT) 0.4 MG SL tablet, Place 1 tablet (0.4 mg total) under the tongue every 5 (five) minutes x 3 doses as needed for chest pain., Disp: 25 tablet, Rfl: 3 .  ondansetron (ZOFRAN) 8 MG tablet, Take 8 mg by mouth every 8 (eight) hours as needed for nausea or vomiting. , Disp: , Rfl:  .  pantoprazole (PROTONIX) 40 MG tablet, Take 1 tablet by mouth 2 (two) times daily as needed (acid reflux). , Disp: , Rfl: 0 .  prasugrel (EFFIENT) 10 MG TABS tablet, Take 1 tablet (10 mg total) by mouth daily., Disp: 90 tablet, Rfl: 3 .  ranitidine (ZANTAC) 150 MG tablet, Take 1 tablet (150 mg total) by mouth 2 (two) times daily., Disp: 30 tablet, Rfl: 3 .  rosuvastatin (CRESTOR) 10 MG  tablet, Take 1 tablet (10 mg total) by mouth daily., Disp: 90 tablet, Rfl: 3 .  saxagliptin HCl (ONGLYZA) 2.5 MG TABS tablet, Take 5 mg by mouth daily., Disp: , Rfl:  .  spironolactone (ALDACTONE) 25 MG tablet, Take 1 tablet (25 mg total) by mouth daily., Disp: 90 tablet, Rfl: 3 .  zinc sulfate 220 (50 Zn) MG capsule, Take 220 mg by mouth daily., Disp: , Rfl:

## 2017-07-24 ENCOUNTER — Ambulatory Visit: Payer: BLUE CROSS/BLUE SHIELD | Admitting: Adult Health

## 2017-07-25 ENCOUNTER — Ambulatory Visit (INDEPENDENT_AMBULATORY_CARE_PROVIDER_SITE_OTHER): Payer: BLUE CROSS/BLUE SHIELD | Admitting: Adult Health

## 2017-07-25 ENCOUNTER — Encounter: Payer: Self-pay | Admitting: Adult Health

## 2017-07-25 ENCOUNTER — Encounter (HOSPITAL_COMMUNITY)
Admission: RE | Admit: 2017-07-25 | Discharge: 2017-07-25 | Disposition: A | Discharge status: Home / Self Care | Payer: BLUE CROSS/BLUE SHIELD | Source: Ambulatory Visit | Attending: Cardiology | Admitting: Cardiology

## 2017-07-25 DIAGNOSIS — Z955 Presence of coronary angioplasty implant and graft: Secondary | ICD-10-CM

## 2017-07-25 DIAGNOSIS — J4520 Mild intermittent asthma, uncomplicated: Secondary | ICD-10-CM | POA: Diagnosis not present

## 2017-07-25 DIAGNOSIS — K219 Gastro-esophageal reflux disease without esophagitis: Secondary | ICD-10-CM | POA: Diagnosis not present

## 2017-07-25 DIAGNOSIS — I214 Non-ST elevation (NSTEMI) myocardial infarction: Secondary | ICD-10-CM

## 2017-07-25 DIAGNOSIS — J309 Allergic rhinitis, unspecified: Secondary | ICD-10-CM

## 2017-07-25 DIAGNOSIS — I252 Old myocardial infarction: Secondary | ICD-10-CM | POA: Diagnosis not present

## 2017-07-25 LAB — GLUCOSE, CAPILLARY: GLUCOSE-CAPILLARY: 205 mg/dL — AB (ref 65–99)

## 2017-07-25 NOTE — Patient Instructions (Signed)
Begin Asmanex  2 puffs Twice daily  , rinse after use.  Increase Zantac 150mg  Twice daily  .  GERD diet  Begin Zyrtec 10mg  At bedtime   Continue on Singulair daily.  Follow up Dr. Delton CoombesByrum  6 weeks and As needed   Please contact office for sooner follow up if symptoms do not improve or worsen or seek emergency care

## 2017-07-25 NOTE — Progress Notes (Signed)
@Patient  ID: Cynthia Kirby, female    DOB: Dec 11, 1970, 47 y.o.   MRN: [id number]  Chief Complaint  Patient presents with  . Acute Visit    SOB     Referring provider: Heron Nay, PA  HPI: 47 yo female never smoker followed dyspnea/Asthma , GERD, AR    PFT 04/03/17: FVC 3.06 L (78%) FEV1 2.69 L (86%) FEV1/FVC 0.88 FEF 25-75 3.66 L (118%) negative bronchodilator response TLC 4.29 L (80%) RV 80% ERV 20% DLCO corrected 78%  02/13/17: Walked 408 meters / Baseline Sat 100% on RA / Nadir Sat 100% on RA (had chest discomfort during test that resolved w/ rest)  IMAGING ESOPHAGRAM/BARIUM SWALLOW 01/09/17 (per radiologist):  Negative esophagram aside from a small gastric hiatal hernia, significance of which is doubtful.  MAXILLOFACIAL CT LTD W/O 01/08/17 (per radiologist):  Mild mucosal edema maxillary sinus bilaterally  CXR PA/LAT 11/24/16 (previously reviewed by me):  No parenchymal mass or opacity appreciated. No pleural effusion. Heart normal in size & mediastinum normal in contour.  CT CHEST W/ CONTRAST 03/27/15 (previously reviewed by me):  No parenchymal nodule, mass, or opacity appreciated. Normal airways. No pleural effusion or thickening. No pericardial effusion. No pathologic mediastinal adenopathy. Small hiatal hernia noted. Hepatic steatosis present on limited upper abdominal windows. Also some scoliosis is present.  CARDIAC TTE (11/27/16):  LV normal in size with moderate LVH. EF 60-65% with grade 1 diastolic dysfunction. LA & RA normal in size. RV normal in size and function. No aortic stenosis or regurgitation. Aortic root normal in size. No mitral stenosis or regurgitation. No pulmonic regurgitation. Trivial tricuspid regurgitation. No pericardial effusion.  LHC (11/13/16):  Mid RCA lesion, 50 %stenosed. Diffuse stenosis.  Mid Cx lesion, 40 %stenosed. Diffuse stenosis.  Ost 2nd Mrg to 2nd Mrg lesion, 40 %stenosed.  Ost 2nd Diag to 2nd Diag lesion, 70  %stenosed.  The left ventricular systolic function is normal.  LV end diastolic pressure is normal.  The left ventricular ejection fraction is 55-65% by visual estimate.  There is no aortic valve stenosis.  Mid LAD lesion, 95 %stenosed. A STENT SYNERGY DES [id number] drug eluting stent was successfully placed, postdilated to 3.6 mm.  Post intervention, there is a 0% residual stenosis.  LABS 01/02/17 IgE:  13 RAST Panel:  Negative   12/01/16   CBC: 8.5 / 11.0 / 32.8 / 462 Eos:  3  07/25/2017 Follow up : Dyspnea/Asthma/AR  Pt returns for 3 month follow up . She has been followed for episodes of intermittent dyspnea . 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 She was treated with potential GERD , and AR triggers. Started on Singulair last office visit. Did not feel this helped with symptoms.  She denies increased cough , wheezing , fever, chest pain or edema.  Does have some intermittent GERD with reflux into throat at time on zantac daily.  Has some sinus drainage w/ post nasal drainage.      Allergies  Allergen Reactions  . Penicillins Anaphylaxis and Itching  . Dapagliflozin     Other reaction(s): Other Genital mycotic infections  . Orange Oil Swelling    Tongue swelling.   . Lisinopril     Other reaction(s): COUGH  . Penicillin G Swelling    There is no immunization history for the selected administration types on file for this patient.  Past Medical History:  Diagnosis Date  . Allergic rhinitis   . Anemia   .  Anxiety   . Arthritis    "left ankle; knees" (11/13/2016)  . CAD in native artery 11/12/2016   A. NSTEMI with LHC 11/13/16 RCA 50%, Circ 40%, 2nd OM 40%, 2nd diag 70%, Mid LAD 95%- DES placed. EF normal. b. Repeat nuc 11/2016 was normal.  . Depression    "hx; not now" (11/13/2016)  . GERD (gastroesophageal reflux disease)   . Headache    "weekly" (11/13/2016)  . Hyperlipidemia   . Hypertension   . Hypothyroidism   . Migraine     "weekly" (11/13/2016)  . Sleep apnea    "CPAP RX'd; never used" (11/13/2016)  . Type II diabetes mellitus (HCC)     Tobacco History: Social History   Tobacco Use  Smoking Status Passive Smoke Exposure - Never Smoker  Smokeless Tobacco Never Used  Tobacco Comment   remote smoking socially in her 7320s - "maybe 5 todal   Counseling given: Not Answered Comment: remote smoking socially in her 7920s - "maybe 5 todal   Outpatient Encounter Medications as of 07/25/2017  Medication Sig  . amLODipine (NORVASC) 10 MG tablet Take 1 tablet (10 mg total) by mouth daily.  Marland Kitchen. aspirin 81 MG chewable tablet Chew 1 tablet (81 mg total) by mouth daily.  . cyanocobalamin 500 MCG tablet Take 500 mcg by mouth daily.  . Echinacea 125 MG CAPS Take 1 capsule by mouth daily.   . fluticasone (FLONASE) 50 MCG/ACT nasal spray Place 2 sprays into both nostrils daily.  . insulin glargine (LANTUS) 100 UNIT/ML injection Inject 14 Units into the skin at bedtime.  Marland Kitchen. losartan (COZAAR) 100 MG tablet Take 1 tablet (100 mg total) by mouth daily.  . metFORMIN (GLUCOPHAGE) 1000 MG tablet Take 1,000 mg by mouth 2 (two) times daily with a meal.   . metoprolol succinate (TOPROL-XL) 50 MG 24 hr tablet Take 50 mg by mouth daily. Take with or immediately following a meal.  . montelukast (SINGULAIR) 10 MG tablet Take 1 tablet (10 mg total) by mouth at bedtime.  . nitroGLYCERIN (NITROSTAT) 0.4 MG SL tablet Place 1 tablet (0.4 mg total) under the tongue every 5 (five) minutes x 3 doses as needed for chest pain.  Marland Kitchen. ondansetron (ZOFRAN) 8 MG tablet Take 8 mg by mouth every 8 (eight) hours as needed for nausea or vomiting.   . prasugrel (EFFIENT) 10 MG TABS tablet Take 1 tablet (10 mg total) by mouth daily.  . ranitidine (ZANTAC) 150 MG tablet Take 1 tablet (150 mg total) by mouth 2 (two) times daily.  . rosuvastatin (CRESTOR) 10 MG tablet Take 1 tablet (10 mg total) by mouth daily.  Marland Kitchen. zinc sulfate 220 (50 Zn) MG capsule Take 220 mg by  mouth daily.  . pantoprazole (PROTONIX) 40 MG tablet Take 1 tablet by mouth 2 (two) times daily as needed (acid reflux).   . saxagliptin HCl (ONGLYZA) 2.5 MG TABS tablet Take 5 mg by mouth daily.  Marland Kitchen. spironolactone (ALDACTONE) 25 MG tablet Take 1 tablet (25 mg total) by mouth daily. (Patient not taking: Reported on 07/25/2017)   No facility-administered encounter medications on file as of 07/25/2017.      Review of Systems  Constitutional:   No  weight loss, night sweats,  Fevers, chills, fatigue, or  lassitude.  HEENT:   No headaches,  Difficulty swallowing,  Tooth/dental problems, or  Sore throat,                No sneezing, itching, ear ache,  +nasal congestion,  post nasal drip,   CV:  No chest pain,  Orthopnea, PND, swelling in lower extremities, anasarca, dizziness, palpitations, syncope.   GI  No   abdominal pain, nausea, vomiting, diarrhea, change in bowel habits, loss of appetite, bloody stools.   Resp: No   No chest wall deformity  Skin: no rash or lesions.  GU: no dysuria, change in color of urine, no urgency or frequency.  No flank pain, no hematuria   MS:  No joint pain or swelling.  No decreased range of motion.  No back pain.    Physical Exam  BP 128/80 (BP Location: Left Arm, Cuff Size: Normal)   Pulse 91   Ht 5\' 5"  (1.651 m)   Wt 230 lb 3.2 oz (104.4 kg)   SpO2 100%   BMI 38.31 kg/m   GEN: A/Ox3; pleasant , NAD, obese    HEENT:  [place]/AT,  EACs-clear, TMs-wnl, NOSE-clear, THROAT-clear, no lesions, no postnasal drip or exudate noted.   NECK:  Supple w/ fair ROM; no JVD; normal carotid impulses w/o bruits; no thyromegaly or nodules palpated; no lymphadenopathy.    RESP  Clear  P & A; w/o, wheezes/ rales/ or rhonchi. no accessory muscle use, no dullness to percussion  CARD:  RRR, no m/r/g, no peripheral edema, pulses intact, no cyanosis or clubbing.  GI:   Soft & nt; nml bowel sounds; no organomegaly or masses detected.   Musco: Warm bil, no deformities or  joint swelling noted.   Neuro: alert, no focal deficits noted.    Skin: Warm, no lesions or rashes    Lab Results:  BMET   BNPImaging: No results found.   Assessment & Plan:   No problem-specific Assessment & Plan notes found for this encounter.     Rubye Oaks, NP 07/25/2017

## 2017-07-26 ENCOUNTER — Ambulatory Visit: Payer: BLUE CROSS/BLUE SHIELD | Admitting: Adult Health

## 2017-07-26 DIAGNOSIS — J45909 Unspecified asthma, uncomplicated: Secondary | ICD-10-CM | POA: Insufficient documentation

## 2017-07-26 MED ORDER — MOMETASONE FUROATE 100 MCG/ACT IN AERO: 2.0000 | INHALATION_SPRAY | Freq: Two times a day (BID) | RESPIRATORY_TRACT | 3 refills | Status: DC

## 2017-07-26 MED ORDER — MOMETASONE FUROATE 100 MCG/ACT IN AERO: 2.0000 | INHALATION_SPRAY | Freq: Two times a day (BID) | RESPIRATORY_TRACT | 0 refills | Status: DC

## 2017-07-26 NOTE — Assessment & Plan Note (Signed)
Add zyrtec   Plan  Patient Instructions  Begin Asmanex  2 puffs Twice daily  , rinse after use.  Increase Zantac 150mg  Twice daily  .  GERD diet  Begin Zyrtec 10mg  At bedtime   Continue on Singulair daily.  Follow up Dr. Delton CoombesByrum  6 weeks and As needed   Please contact office for sooner follow up if symptoms do not improve or worsen or seek emergency care

## 2017-07-26 NOTE — Assessment & Plan Note (Signed)
Possible asthma component   Plan  . Patient Instructions  Begin Asmanex  2 puffs Twice daily  , rinse after use.  Increase Zantac 150mg  Twice daily  .  GERD diet  Begin Zyrtec 10mg  At bedtime   Continue on Singulair daily.  Follow up Dr. Delton CoombesByrum  6 weeks and As needed   Please contact office for sooner follow up if symptoms do not improve or worsen or seek emergency care

## 2017-07-26 NOTE — Assessment & Plan Note (Signed)
Increased GERD t x  Plan  Patient Instructions  Begin Asmanex  2 puffs Twice daily  , rinse after use.  Increase Zantac 150mg  Twice daily  .  GERD diet  Begin Zyrtec 10mg  At bedtime   Continue on Singulair daily.  Follow up Dr. Delton CoombesByrum  6 weeks and As needed   Please contact office for sooner follow up if symptoms do not improve or worsen or seek emergency care

## 2017-07-27 ENCOUNTER — Encounter (HOSPITAL_COMMUNITY): Payer: BLUE CROSS/BLUE SHIELD

## 2017-07-30 ENCOUNTER — Encounter (HOSPITAL_COMMUNITY): Payer: BLUE CROSS/BLUE SHIELD

## 2017-07-30 ENCOUNTER — Telehealth (HOSPITAL_COMMUNITY): Payer: Self-pay | Admitting: Physician Assistant

## 2017-08-01 ENCOUNTER — Encounter (HOSPITAL_COMMUNITY)
Admission: RE | Admit: 2017-08-01 | Discharge: 2017-08-01 | Disposition: A | Discharge status: Home / Self Care | Payer: BLUE CROSS/BLUE SHIELD | Source: Ambulatory Visit | Attending: Cardiology | Admitting: Cardiology

## 2017-08-01 DIAGNOSIS — K219 Gastro-esophageal reflux disease without esophagitis: Secondary | ICD-10-CM | POA: Diagnosis not present

## 2017-08-01 DIAGNOSIS — Z79899 Other long term (current) drug therapy: Secondary | ICD-10-CM | POA: Diagnosis not present

## 2017-08-01 DIAGNOSIS — Z7984 Long term (current) use of oral hypoglycemic drugs: Secondary | ICD-10-CM | POA: Insufficient documentation

## 2017-08-01 DIAGNOSIS — I252 Old myocardial infarction: Secondary | ICD-10-CM | POA: Diagnosis present

## 2017-08-01 DIAGNOSIS — Z7982 Long term (current) use of aspirin: Secondary | ICD-10-CM | POA: Insufficient documentation

## 2017-08-01 DIAGNOSIS — M1990 Unspecified osteoarthritis, unspecified site: Secondary | ICD-10-CM | POA: Insufficient documentation

## 2017-08-01 DIAGNOSIS — Z955 Presence of coronary angioplasty implant and graft: Secondary | ICD-10-CM | POA: Diagnosis not present

## 2017-08-01 DIAGNOSIS — F419 Anxiety disorder, unspecified: Secondary | ICD-10-CM | POA: Diagnosis not present

## 2017-08-01 DIAGNOSIS — Z7951 Long term (current) use of inhaled steroids: Secondary | ICD-10-CM | POA: Insufficient documentation

## 2017-08-01 DIAGNOSIS — E119 Type 2 diabetes mellitus without complications: Secondary | ICD-10-CM | POA: Diagnosis not present

## 2017-08-01 DIAGNOSIS — G4730 Sleep apnea, unspecified: Secondary | ICD-10-CM | POA: Diagnosis not present

## 2017-08-01 DIAGNOSIS — I214 Non-ST elevation (NSTEMI) myocardial infarction: Secondary | ICD-10-CM

## 2017-08-01 DIAGNOSIS — E039 Hypothyroidism, unspecified: Secondary | ICD-10-CM | POA: Insufficient documentation

## 2017-08-01 DIAGNOSIS — I2510 Atherosclerotic heart disease of native coronary artery without angina pectoris: Secondary | ICD-10-CM | POA: Insufficient documentation

## 2017-08-01 DIAGNOSIS — I10 Essential (primary) hypertension: Secondary | ICD-10-CM | POA: Diagnosis not present

## 2017-08-01 DIAGNOSIS — E785 Hyperlipidemia, unspecified: Secondary | ICD-10-CM | POA: Insufficient documentation

## 2017-08-01 LAB — GLUCOSE, CAPILLARY: GLUCOSE-CAPILLARY: 209 mg/dL — AB (ref 65–99)

## 2017-08-03 ENCOUNTER — Encounter (HOSPITAL_COMMUNITY): Payer: BLUE CROSS/BLUE SHIELD

## 2017-08-06 ENCOUNTER — Encounter (HOSPITAL_COMMUNITY): Payer: BLUE CROSS/BLUE SHIELD

## 2017-08-08 ENCOUNTER — Encounter (HOSPITAL_COMMUNITY)
Admission: RE | Admit: 2017-08-08 | Discharge: 2017-08-08 | Disposition: A | Discharge status: Home / Self Care | Payer: BLUE CROSS/BLUE SHIELD | Source: Ambulatory Visit | Attending: Cardiology | Admitting: Cardiology

## 2017-08-08 DIAGNOSIS — I214 Non-ST elevation (NSTEMI) myocardial infarction: Secondary | ICD-10-CM

## 2017-08-08 DIAGNOSIS — I252 Old myocardial infarction: Secondary | ICD-10-CM | POA: Diagnosis not present

## 2017-08-08 DIAGNOSIS — Z955 Presence of coronary angioplasty implant and graft: Secondary | ICD-10-CM

## 2017-08-08 LAB — GLUCOSE, CAPILLARY: Glucose-Capillary: 295 mg/dL — ABNORMAL HIGH (ref 65–99)

## 2017-08-08 NOTE — Progress Notes (Signed)
Daily Session Note  Patient Details  Name: Cynthia Kirby MRN: [id number] Date of Birth: December 09, 1970 Referring Provider:     CARDIAC REHAB PHASE II ORIENTATION from 05/29/2017 in [place]  Referring Provider  Fransico Him, MD.      Encounter Date: 08/08/2017  Check In: Session Check In - 08/08/17 1444      Check-In   Location  [facility] Simultaneous filing. User may not have seen previous data.    Staff Present  Dorma Russell, MS,ACSM CEP, Exercise Physiologist;Joann Rion, RN, Market researcher, RN, Deland Pretty, MS, ACSM CEP, Exercise Physiologist;Amber Fair, MS, ACSM RCEP, Exercise Physiologist Simultaneous filing. User may not have seen previous data.    Supervising physician immediately available to respond to emergencies  [facility] immediately available Simultaneous filing. User may not have seen previous data.    Physician(s)  Dr. Tyrell Antonio Simultaneous filing. User may not have seen previous data.    Medication changes reported      No Simultaneous filing. User may not have seen previous data.    Fall or balance concerns reported     No Simultaneous filing. User may not have seen previous data.    Tobacco Cessation  No Change Simultaneous filing. User may not have seen previous data.    Warm-up and Cool-down  Performed as group-led instruction Simultaneous filing. User may not have seen previous data.    Resistance Training Performed  No Simultaneous filing. User may not have seen previous data.    VAD Patient?  No Simultaneous filing. User may not have seen previous data.      Pain Assessment   Currently in Pain?  No/denies Simultaneous filing. User may not have seen previous data.    Multiple Pain Sites  No       Capillary Blood Glucose: Results for orders placed or performed during the hospital encounter of 08/08/17 (from the past 24 hour(s))  Glucose, capillary     Status: Abnormal   Collection Time: 08/08/17   1:39 PM  Result Value Ref Range   Glucose-Capillary 295 (H) 65 - 99 mg/dL      Social History   Tobacco Use  Smoking Status Passive Smoke Exposure - Never Smoker  Smokeless Tobacco Never Used  Tobacco Comment   remote smoking socially in her 76s - "maybe 5 todal    Goals Met:  Exercise tolerated well  Goals Unmet:  Not Applicable  Comments: pt reported at cardiac rehab today that she cancelled her endocrinology consult appointment for today due to stomach upset this morning.  Pt states she has requested to reschedule the appointment. Pt advised importance of diabetic control and MD guidance.  Pt verbalized understanding.   Dr. Fransico Him is Medical Director for Cardiac Rehab at [facility].

## 2017-08-12 DIAGNOSIS — I10 Essential (primary) hypertension: Secondary | ICD-10-CM | POA: Insufficient documentation

## 2017-08-12 NOTE — Progress Notes (Deleted)
Cardiology Office Note:    Date:  08/12/2017   ID:  Cynthia Kirby, DOB Jan 22, 1971, MRN [id number]  PCP:  Heron NayYoung, Lauren E, PA  Cardiologist:  No primary care provider on file.    Referring MD: Heron NayYoung, Lauren E, PA   No chief complaint on file.   History of Present Illness:    Cynthia Slipperara Ahr is a 47 y.o. female with a hx of DM, HTN, severe anxiety, social phobia, panic attacks, hyperlipidemia, and CAD with NSTEMI with cath showing 95% mLAD s/p DES, otherwise residual 50% mRCa, 40% mCx, 40% OM2, 70% D2, EF 55-65%. She has chronic DOE that initially was thought to be due to Brilinta but did not resolve after changing to Effient.  This is felt to be due to anxiety and nuclear stress test was normal.    She is here today for followup and is doing well.  She denies any chest pain or pressure, SOB, DOE, PND, orthopnea, LE edema, dizziness, palpitations or syncope. She is compliant with her meds and is tolerating meds with no SE.     Past Medical History:  Diagnosis Date  . Allergic rhinitis   . Anemia   . Anxiety   . Arthritis    "left ankle; knees" (11/13/2016)  . CAD in native artery 11/12/2016   A. NSTEMI with LHC 11/13/16 RCA 50%, Circ 40%, 2nd OM 40%, 2nd diag 70%, Mid LAD 95%- DES placed. EF normal. b. Repeat nuc 11/2016 was normal.  . Depression    "hx; not now" (11/13/2016)  . GERD (gastroesophageal reflux disease)   . Headache    "weekly" (11/13/2016)  . Hyperlipidemia   . Hypertension   . Hypothyroidism   . Migraine    "weekly" (11/13/2016)  . Sleep apnea    "CPAP RX'd; never used" (11/13/2016)  . Type II diabetes mellitus (HCC)     Past Surgical History:  Procedure Laterality Date  . CORONARY ANGIOPLASTY WITH STENT PLACEMENT  11/13/2016  . CORONARY STENT INTERVENTION N/A 11/13/2016   Procedure: Coronary Stent Intervention;  Surgeon: Corky CraftsVaranasi, Jayadeep S, MD;  [facility];  Service: Cardiovascular;  Laterality: N/A;  . INDUCED ABORTION    . LEFT HEART CATH AND  CORONARY ANGIOGRAPHY N/A 11/13/2016   Procedure: Left Heart Cath and Coronary Angiography;  Surgeon: Corky CraftsVaranasi, Jayadeep S, MD;  [facility];  Service: Cardiovascular;  Laterality: N/A;  . TONSILLECTOMY AND ADENOIDECTOMY  2011    Current Medications: No outpatient medications have been marked as taking for the 08/13/17 encounter (Appointment) with Quintella Reicherturner, Kennidy Lamke R, MD.     Allergies:   Penicillins; Dapagliflozin; Orange oil; Lisinopril; and Penicillin g   Social History   Socioeconomic History  . Marital status: Single    Spouse name: Not on file  . Number of children: Not on file  . Years of education: Not on file  . Highest education level: Not on file  Social Needs  . Financial resource strain: Not hard at all  . Food insecurity - worry: Never true  . Food insecurity - inability: Never true  . Transportation needs - medical: No  . Transportation needs - non-medical: No  Occupational History  . Occupation: Network engineerowner of business, Print production planneroffice manager  Tobacco Use  . Smoking status: Passive Smoke Exposure - Never Smoker  . Smokeless tobacco: Never Used  . Tobacco comment: remote smoking socially in her 5020s - "maybe 5 todal  Substance and Sexual Activity  . Alcohol use: Yes  Comment: 11/13/2016 "might have a couple drinks/year"  . Drug use: No  . Sexual activity: Yes    Birth control/protection: None  Other Topics Concern  . Not on file  Social History Narrative   [facility] (01/02/17):   Originally from [place]. She moved to [facility] [facility] in 2013. She is a Scientist, research (life sciences). Her parents retired to [employer]. 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 She reports remote exposure to a cockatiel & parakeets. She has questionable recent exposure to mold. No hot tub exposure. Enjoys writing music.      Family History: The patient's family history includes Diabetes in her father and mother; Healthy in her brother and sister; Hypertension in her father and  mother; Prostate cancer in her father. There is no history of Lung disease.  ROS:   Please see the history of present illness.    ROS  All other systems reviewed and negative.   EKGs/Labs/Other Studies Reviewed:    The following studies were reviewed today: none  EKG:  EKG is not ordered today.    Recent Labs: 11/13/2016: B Natriuretic Peptide 67.6 11/29/2016: NT-Pro BNP 134; TSH 2.130 12/01/2016: Hemoglobin 11.0; Platelets 462 12/06/2016: BUN 9; Creatinine, Ser 0.90; Potassium 4.4; Sodium 139 06/25/2017: ALT 12   Recent Lipid Panel    Component Value Date/Time   CHOL 163 06/25/2017 0937   TRIG 300 (H) 06/25/2017 0937   HDL 35 (L) 06/25/2017 0937   CHOLHDL 4.7 (H) 06/25/2017 0937   CHOLHDL 5.3 11/13/2016 0627   VLDL 49 (H) 11/13/2016 0627   LDLCALC 68 06/25/2017 0937    Physical Exam:    VS:  There were no vitals taken for this visit.    Wt Readings from Last 3 Encounters:  07/25/17 230 lb 3.2 oz (104.4 kg)  06/25/17 234 lb (106.1 kg)  05/29/17 231 lb 14.8 oz (105.2 kg)     GEN:  Well nourished, well developed in no acute distress HEENT: Normal NECK: No JVD; No carotid bruits LYMPHATICS: No lymphadenopathy CARDIAC: RRR, no murmurs, rubs, gallops RESPIRATORY:  Clear to auscultation without rales, wheezing or rhonchi  ABDOMEN: Soft, non-tender, non-distended MUSCULOSKELETAL:  No edema; No deformity  SKIN: Warm and dry NEUROLOGIC:  Alert and oriented x 3 PSYCHIATRIC:  Normal affect   ASSESSMENT:    1. Coronary artery disease involving native coronary artery of native heart with unstable angina pectoris (HCC)   2. Hyperlipidemia LDL goal <70   3. Benign essential HTN    PLAN:    In order of problems listed above:  1.  ASCAD - s/p  NSTEMI with cath showing 95% mLAD s/p DES, otherwise residual 50% mRCa, 40% mCx, 40% OM2, 70% D2, EF 55-65%. She is tolerating DAPT with ASA and Effient. She has not had any typical anginal symptoms.  She will also continue on statin  and BB.  2.  Hyperlipidemia - LDL goal < 70.  She will continue on creator 10mg  daily. Her LDL was 68 but TAGs were 300 felt secondary to poorly controlled DM and she was referred to Endocrine.    3.  HTN - her BP is well controlled on exam today.  She will continue on Losartan 100mg  daily, amlodipine 10mg  daily, spironolactone 25mg  daily and Toprol XL 50mg  daily.      Medication Adjustments/Labs and Tests Ordered: Current medicines are reviewed at length with the patient today.  Concerns regarding medicines are outlined above.  No orders of  the defined types were placed in this encounter.  No orders of the defined types were placed in this encounter.   Signed, Armanda Magic, MD  08/12/2017 2:28 PM    [facility]

## 2017-08-13 ENCOUNTER — Ambulatory Visit: Payer: BLUE CROSS/BLUE SHIELD | Admitting: Cardiology

## 2017-08-13 ENCOUNTER — Encounter: Payer: BLUE CROSS/BLUE SHIELD | Admitting: Cardiology

## 2017-08-16 ENCOUNTER — Encounter: Payer: Self-pay | Admitting: Cardiology

## 2017-08-16 ENCOUNTER — Encounter (HOSPITAL_COMMUNITY): Payer: Self-pay

## 2017-08-16 NOTE — Progress Notes (Signed)
Cardiac Individual Treatment Plan  Patient Details  Name: Cynthia Kirby MRN: [id number] Date of Birth: 1971/05/14 Referring Provider:     CARDIAC REHAB PHASE II ORIENTATION from 05/29/2017 in [facility] CARDIAC REHAB  Referring Provider  Armanda Magic, MD.      Initial Encounter Date:    CARDIAC REHAB PHASE II ORIENTATION from 05/29/2017 in [facility] CARDIAC REHAB  Date  05/29/17  Referring Provider  Armanda Magic, MD.      Visit Diagnosis: 11/12/16 NSTEMI (non-ST elevated myocardial infarction) (HCC)  11/13/16 Status post coronary artery stent placement  Patient's Home Medications on Admission:  Current Outpatient Medications:  .  amLODipine (NORVASC) 10 MG tablet, Take 1 tablet (10 mg total) by mouth daily., Disp: 90 tablet, Rfl: 3 .  aspirin 81 MG chewable tablet, Chew 1 tablet (81 mg total) by mouth daily., Disp: 30 tablet, Rfl: 11 .  cyanocobalamin 500 MCG tablet, Take 500 mcg by mouth daily., Disp: , Rfl:  .  Echinacea 125 MG CAPS, Take 1 capsule by mouth daily. , Disp: , Rfl:  .  fluticasone (FLONASE) 50 MCG/ACT nasal spray, Place 2 sprays into both nostrils daily., Disp: , Rfl:  .  insulin glargine (LANTUS) 100 UNIT/ML injection, Inject 14 Units into the skin at bedtime., Disp: , Rfl:  .  losartan (COZAAR) 100 MG tablet, Take 1 tablet (100 mg total) by mouth daily., Disp: 90 tablet, Rfl: 3 .  metFORMIN (GLUCOPHAGE) 1000 MG tablet, Take 1,000 mg by mouth 2 (two) times daily with a meal. , Disp: , Rfl:  .  metoprolol succinate (TOPROL-XL) 50 MG 24 hr tablet, Take 50 mg by mouth daily. Take with or immediately following a meal., Disp: , Rfl:  .  Mometasone Furoate (ASMANEX HFA) 100 MCG/ACT AERO, Inhale 2 puffs into the lungs 2 (two) times daily., Disp: 1 Inhaler, Rfl: 0 .  Mometasone Furoate (ASMANEX HFA) 100 MCG/ACT AERO, Inhale 2 puffs into the lungs 2 (two) times daily., Disp: 1 Inhaler, Rfl: 3 .  montelukast (SINGULAIR) 10 MG tablet, Take 1  tablet (10 mg total) by mouth at bedtime., Disp: 30 tablet, Rfl: 3 .  nitroGLYCERIN (NITROSTAT) 0.4 MG SL tablet, Place 1 tablet (0.4 mg total) under the tongue every 5 (five) minutes x 3 doses as needed for chest pain., Disp: 25 tablet, Rfl: 3 .  ondansetron (ZOFRAN) 8 MG tablet, Take 8 mg by mouth every 8 (eight) hours as needed for nausea or vomiting. , Disp: , Rfl:  .  pantoprazole (PROTONIX) 40 MG tablet, Take 1 tablet by mouth 2 (two) times daily as needed (acid reflux). , Disp: , Rfl: 0 .  prasugrel (EFFIENT) 10 MG TABS tablet, Take 1 tablet (10 mg total) by mouth daily., Disp: 90 tablet, Rfl: 3 .  ranitidine (ZANTAC) 150 MG tablet, Take 1 tablet (150 mg total) by mouth 2 (two) times daily., Disp: 30 tablet, Rfl: 3 .  rosuvastatin (CRESTOR) 10 MG tablet, Take 1 tablet (10 mg total) by mouth daily., Disp: 90 tablet, Rfl: 3 .  saxagliptin HCl (ONGLYZA) 2.5 MG TABS tablet, Take 5 mg by mouth daily., Disp: , Rfl:  .  spironolactone (ALDACTONE) 25 MG tablet, Take 1 tablet (25 mg total) by mouth daily. (Patient not taking: Reported on 07/25/2017), Disp: 90 tablet, Rfl: 3 .  zinc sulfate 220 (50 Zn) MG capsule, Take 220 mg by mouth daily., Disp: , Rfl:   Past Medical History: Past Medical History:  Diagnosis Date  .  Allergic rhinitis   . Anemia   . Anxiety   . Arthritis    "left ankle; knees" (11/13/2016)  . CAD in native artery 11/12/2016   A. NSTEMI with LHC 11/13/16 RCA 50%, Circ 40%, 2nd OM 40%, 2nd diag 70%, Mid LAD 95%- DES placed. EF normal. b. Repeat nuc 11/2016 was normal.  . Depression    "hx; not now" (11/13/2016)  . GERD (gastroesophageal reflux disease)   . Headache    "weekly" (11/13/2016)  . Hyperlipidemia   . Hypertension   . Hypothyroidism   . Migraine    "weekly" (11/13/2016)  . Sleep apnea    "CPAP RX'd; never used" (11/13/2016)  . Type II diabetes mellitus (HCC)     Tobacco Use: Social History   Tobacco Use  Smoking Status Passive Smoke Exposure - Never Smoker   Smokeless Tobacco Never Used  Tobacco Comment   remote smoking socially in her 14s - "maybe 5 todal    Labs: Recent Review Flowsheet Data    Labs for ITP Cardiac and Pulmonary Rehab Latest Ref Rng & Units 11/13/2016 02/06/2017 06/25/2017   Cholestrol 100 - 199 mg/dL 409(W) 119 147   LDLCALC 0 - 99 mg/dL 829(F) 85 68   HDL >62 mg/dL 13(Y) 86(V) 78(I)   Trlycerides 0 - 149 mg/dL 696(E) 952(W) 413(K)   Hemoglobin A1c 4.8 - 5.6 % 10.5(H) - -      Capillary Blood Glucose: Lab Results  Component Value Date   GLUCAP 295 (H) 08/08/2017   GLUCAP 209 (H) 08/01/2017   GLUCAP 205 (H) 07/25/2017   GLUCAP 208 (H) 07/23/2017   GLUCAP 248 (H) 07/23/2017     Exercise Target Goals:    Exercise Program Goal: Individual exercise prescription set using results from initial 6 min walk test and THRR while considering  patient's activity barriers and safety.   Exercise Prescription Goal: Initial exercise prescription builds to 30-45 minutes a day of aerobic activity, 2-3 days per week.  Home exercise guidelines will be given to patient during program as part of exercise prescription that the participant will acknowledge.  Activity Barriers & Risk Stratification: Activity Barriers & Cardiac Risk Stratification - 05/29/17 1132      Activity Barriers & Cardiac Risk Stratification   Activity Barriers  Back Problems    Comments  Bilateral knee pain, left ankle, right hip, lower back pain    Cardiac Risk Stratification  High       6 Minute Walk: 6 Minute Walk    Row Name 05/29/17 1130         6 Minute Walk   Phase  Initial     Distance  1400 feet     Walk Time  6 minutes     # of Rest Breaks  0     MPH  2.65     METS  4.39     RPE  13     VO2 Peak  15.36     Symptoms  No     Resting HR  99 bpm     Resting BP  130/94     Resting Oxygen Saturation   99 %     Max Ex. HR  132 bpm     Max Ex. BP  154/94     2 Minute Post BP  120/80        Oxygen Initial Assessment:   Oxygen  Re-Evaluation:   Oxygen Discharge (Final Oxygen Re-Evaluation):   Initial Exercise Prescription: Initial Exercise  Prescription - 05/29/17 1100      Date of Initial Exercise RX and Referring Provider   Date  05/29/17    Referring Provider  Armanda Magic, MD.      Treadmill   MPH  2.2    Grade  1    Minutes  15    METs  2.99      Recumbant Bike   Level  3    Minutes  10    METs  2.9      NuStep   Level  3    SPM  85    Minutes  15    METs  2.9      Prescription Details   Frequency (times per week)  3    Duration  Progress to 30 minutes of continuous aerobic without signs/symptoms of physical distress      Intensity   THRR 40-80% of Max Heartrate  70-141    Ratings of Perceived Exertion  11-13    Perceived Dyspnea  0-4      Progression   Progression  Continue progressive overload as per policy without signs/symptoms or physical distress.      Resistance Training   Training Prescription  Yes    Weight  3lbs    Reps  10-15       Perform Capillary Blood Glucose checks as needed.  Exercise Prescription Changes: Exercise Prescription Changes    Row Name 02/21/17 1600 03/05/17 1700 03/14/17 1150 07/02/17 1600 07/11/17 1021     Response to Exercise   Blood Pressure (Admit)  146/82  132/86  140/60  160/70  132/70   Blood Pressure (Exercise)  154/100  160/80  138/88  158/90  132/76   Blood Pressure (Exit)  126/80  134/80  134/84  140/80  120/60   Heart Rate (Admit)  95 bpm  93 bpm  92 bpm  96 bpm  99 bpm   Heart Rate (Exercise)  121 bpm  117 bpm  122 bpm  127 bpm  113 bpm   Heart Rate (Exit)  94 bpm  96 bpm  91 bpm  82 bpm  86 bpm   Rating of Perceived Exertion (Exercise)  12  13  13  13  13    Symptoms  none  none  none  none  none   Comments  pt had a late start to exercise  -  -  late start  late start   Duration  Continue with 30 min of aerobic exercise without signs/symptoms of physical distress.  Continue with 30 min of aerobic exercise without signs/symptoms  of physical distress.  Continue with 30 min of aerobic exercise without signs/symptoms of physical distress.  Continue with 30 min of aerobic exercise without signs/symptoms of physical distress.  Continue with 30 min of aerobic exercise without signs/symptoms of physical distress.   Intensity  THRR unchanged  THRR unchanged  THRR unchanged  THRR unchanged  THRR unchanged     Progression   Progression  Continue to progress workloads to maintain intensity without signs/symptoms of physical distress.  Continue to progress workloads to maintain intensity without signs/symptoms of physical distress.  Continue to progress workloads to maintain intensity without signs/symptoms of physical distress.  Continue to progress workloads to maintain intensity without signs/symptoms of physical distress.  Continue to progress workloads to maintain intensity without signs/symptoms of physical distress.   Average METs  2.9  2.6  3.5  2.7  3.1     Resistance Training  Training Prescription  Yes  Yes  Yes  Yes  Yes   Weight  2lbs  3lbs  3lbs  3lbs  4   Reps  10-15  10-15  10-15  10-15  10-15   Time  10 Minutes  10 Minutes  10 Minutes  10 Minutes  10 Minutes     Treadmill   MPH  2.2  2.2  2.5  2.2  2.2   Grade  1  2  3  1  1    Minutes  15  15  15  15  15    METs  2.99  3.29  3.95  2.99  2.99     NuStep   Level  -  3  3  3  4    SPM  -  75  80  75  80   Minutes  -  15  15  10  10    METs  -  2  3.1  2.5  3.2     Home Exercise Plan   Plans to continue exercise at  -  -  Home (comment) walking  -  Home (comment) walking   Frequency  -  -  Add 3 additional days to program exercise sessions.  -  Add 2 additional days to program exercise sessions.   Initial Home Exercises Provided  -  -  03/07/17  -  07/09/17   Row Name 07/25/17 1600 08/01/17 1605 08/14/17 1600         Response to Exercise   Blood Pressure (Admit)  140/90  120/80  128/86     Blood Pressure (Exercise)  128/82  144/70  142/84     Blood  Pressure (Exit)  118/92  120/80  114/70     Heart Rate (Admit)  88 bpm  99 bpm  85 bpm     Heart Rate (Exercise)  134 bpm  121 bpm  128 bpm     Heart Rate (Exit)  98 bpm  92 bpm  94 bpm     Rating of Perceived Exertion (Exercise)  13  13  13      Symptoms  none  none  none     Comments  -  late start  -     Duration  Continue with 30 min of aerobic exercise without signs/symptoms of physical distress.  Continue with 30 min of aerobic exercise without signs/symptoms of physical distress.  Continue with 30 min of aerobic exercise without signs/symptoms of physical distress.     Intensity  THRR unchanged  THRR unchanged  THRR unchanged       Progression   Progression  Continue to progress workloads to maintain intensity without signs/symptoms of physical distress.  Continue to progress workloads to maintain intensity without signs/symptoms of physical distress.  Continue to progress workloads to maintain intensity without signs/symptoms of physical distress.     Average METs  3.2  3.8  4.1       Resistance Training   Training Prescription  No relaxation day  No relaxation day  No relaxation day       Treadmill   MPH  2.3  3  3      Grade  2  2  2      Minutes  15  7  30      METs  3.39  4.12  4.12       NuStep   Level  4  4  -     SPM  75  85  -     Minutes  10  10  -     METs  3  3.4  -       Home Exercise Plan   Plans to continue exercise at  Home (comment) walking  Home (comment) walking  Home (comment) walking     Frequency  Add 2 additional days to program exercise sessions.  Add 2 additional days to program exercise sessions.  Add 2 additional days to program exercise sessions.     Initial Home Exercises Provided  07/09/17  07/09/17  07/09/17        Exercise Comments: Exercise Comments    Row Name 03/06/17 1509 03/27/17 1144 07/02/17 1641 07/18/17 1237 07/18/17 1510   Exercise Comments  Reviewed METs and goals. Pt is tolerating exercise well; will continue to monitor pt's  progress and activity levels.  Pt completed 8 sessions of cardiac rehab at [facility], pt requested a transfer to [facility] [place] due to being closer to home. Pt will continue care and exercise program at [facility] cardiac rehab progra,m.  Pt resumed cardiac rehab sessions on 06/25/17. Pt was able to exercise without difficulty. Pt will continue to make progress and increase WL based on RPE, BP and HR or without signs/symptoms of physical distress.  Reviewed METs and goals. Pt is tolerating exercise fairly well and has better understanding of diabetes management. Pt will continue to progress activity as tolerated without signs/symptoms of physical distress.  Reviewed METs and goals. Pt is tolerating exercise fairly well however has challenges/ limitations due to poor diabetes management. Pt will continue to progress activity as tolerated without signs/symptoms of physical distress.   Row Name 08/14/17 1607           Exercise Comments  Reviewed METs and goals. Pt is tolerating exercise fairly well however has challenges/ limitations due to poor diabetes management. Pt will continue to progress activity as tolerated without signs/symptoms of physical distress.          Exercise Goals and Review: Exercise Goals    Row Name 05/29/17 0943             Exercise Goals   Increase Physical Activity  Yes       Intervention  Provide advice, education, support and counseling about physical activity/exercise needs.;Develop an individualized exercise prescription for aerobic and resistive training based on initial evaluation findings, risk stratification, comorbidities and participant's personal goals.       Expected Outcomes  Achievement of increased cardiorespiratory fitness and enhanced flexibility, muscular endurance and strength shown through measurements of functional capacity and personal statement of participant.       Increase Strength and Stamina  Yes improve cardiovascular fitness  and stamina. Develop exercise routine       Intervention  Provide advice, education, support and counseling about physical activity/exercise needs.;Develop an individualized exercise prescription for aerobic and resistive training based on initial evaluation findings, risk stratification, comorbidities and participant's personal goals.       Expected Outcomes  Achievement of increased cardiorespiratory fitness and enhanced flexibility, muscular endurance and strength shown through measurements of functional capacity and personal statement of participant.       Able to understand and use rate of perceived exertion (RPE) scale  Yes       Intervention  Provide education and explanation on how to use RPE scale       Expected Outcomes  Short Term: Able to use  RPE daily in rehab to express subjective intensity level;Long Term:  Able to use RPE to guide intensity level when exercising independently       Knowledge and understanding of Target Heart Rate Range (THRR)  Yes       Intervention  Provide education and explanation of THRR including how the numbers were predicted and where they are located for reference       Expected Outcomes  Short Term: Able to state/look up THRR;Long Term: Able to use THRR to govern intensity when exercising independently;Short Term: Able to use daily as guideline for intensity in rehab       Able to check pulse independently  Yes       Intervention  Provide education and demonstration on how to check pulse in carotid and radial arteries.;Review the importance of being able to check your own pulse for safety during independent exercise       Expected Outcomes  Short Term: Able to explain why pulse checking is important during independent exercise;Long Term: Able to check pulse independently and accurately       Understanding of Exercise Prescription  Yes       Intervention  Provide education, explanation, and written materials on patient's individual exercise prescription        Expected Outcomes  Short Term: Able to explain program exercise prescription;Long Term: Able to explain home exercise prescription to exercise independently          Exercise Goals Re-Evaluation : Exercise Goals Re-Evaluation    Row Name 03/06/17 1507 03/07/17 1441 07/09/17 1613 07/18/17 1239 08/15/17 1554     Exercise Goal Re-Evaluation   Exercise Goals Review  Increase Physical Activity;Able to understand and use rate of perceived exertion (RPE) scale;Knowledge and understanding of Target Heart Rate Range (THRR);Understanding of Exercise Prescription;Increase Strength and Stamina;Able to check pulse independently  Increase Physical Activity;Able to understand and use rate of perceived exertion (RPE) scale;Knowledge and understanding of Target Heart Rate Range (THRR);Increase Strength and Stamina;Able to check pulse independently;Understanding of Exercise Prescription  -  -  Increase Physical Activity;Increase Strength and Stamina;Able to understand and use rate of perceived exertion (RPE) scale;Able to check pulse independently;Understanding of Exercise Prescription;Knowledge and understanding of Target Heart Rate Range (THRR)   Comments  Pt is tolerating WL increases fairly well; pt is currently walking 2.2/2 on treadmill  Reviewed home exercise with pt today.  Pt plans to walk for exercise, goal or target to get 5,000-7,000 steps per day.Reviewed THR, pulse, RPE, sign and symptoms, NTG use, and when to call 911 or MD.  Also discussed weather considerations and indoor options.  Pt voiced understanding.  Reviewed home exercise with pt today. Pt plans to walk for exercise. Pt plans to start with 15-20 minutes then slowly progress to 30 minutes. Reviewed THR, pulse, RPE, sign and symptoms, NTG use, and when to call 911 or MD.  Also discussed weather considerations and indoor options.  Pt voiced understanding.  Reviewed home exercise with pt today. Pt plans to walk for exercise. Pt plans to start with 15-20  minutes then slowly progress to 30 minutes. Reviewed THR, pulse, RPE, sign and symptoms, NTG use, and when to call 911 or MD.  Also discussed weather considerations and indoor options.  Pt voiced understanding.  Pt is making great progress in cardiac rehab and is walking at home for exercise at least 1x/week in addition to coming to cardiac rehab   Expected Outcomes  Pt will continue to improve in  cardiorespiratory fitness  Pt will continue to improve in cardiorespiratory fitness  Pt will continue to improve in cardiorespiratory fitness  Pt will continue to improve in cardiorespiratory fitness  Pt will continue to improve in cardiorespiratory fitness by coming to cardiac rehab and being compliant with HEP       Discharge Exercise Prescription (Final Exercise Prescription Changes): Exercise Prescription Changes - 08/14/17 1600      Response to Exercise   Blood Pressure (Admit)  128/86    Blood Pressure (Exercise)  142/84    Blood Pressure (Exit)  114/70    Heart Rate (Admit)  85 bpm    Heart Rate (Exercise)  128 bpm    Heart Rate (Exit)  94 bpm    Rating of Perceived Exertion (Exercise)  13    Symptoms  none    Duration  Continue with 30 min of aerobic exercise without signs/symptoms of physical distress.    Intensity  THRR unchanged      Progression   Progression  Continue to progress workloads to maintain intensity without signs/symptoms of physical distress.    Average METs  4.1      Resistance Training   Training Prescription  No relaxation day      Treadmill   MPH  3    Grade  2    Minutes  30    METs  4.12      Home Exercise Plan   Plans to continue exercise at  Home (comment) walking    Frequency  Add 2 additional days to program exercise sessions.    Initial Home Exercises Provided  07/09/17       Nutrition:  Target Goals: Understanding of nutrition guidelines, daily intake of sodium 1500mg , cholesterol 200mg , calories 30% from fat and 7% or less from saturated  fats, daily to have 5 or more servings of fruits and vegetables.  Biometrics: Pre Biometrics - 05/29/17 1141      Pre Biometrics   Height  5\' 5"  (1.651 m)    Weight  231 lb 14.8 oz (105.2 kg)    Waist Circumference  42 inches    Hip Circumference  50 inches    Waist to Hip Ratio  0.84 %    BMI (Calculated)  38.59    Triceps Skinfold  47 mm    % Body Fat  48.1 %    Grip Strength  35 kg    Flexibility  11.5 in    Single Leg Stand  12.09 seconds      Post Biometrics - 03/27/17 1154       Post  Biometrics   Weight  225 lb 5 oz (102.2 kg)       Nutrition Therapy Plan and Nutrition Goals:   Nutrition Assessments:   Nutrition Goals Re-Evaluation:   Nutrition Goals Re-Evaluation:   Nutrition Goals Discharge (Final Nutrition Goals Re-Evaluation):   Psychosocial: Target Goals: Acknowledge presence or absence of significant depression and/or stress, maximize coping skills, provide positive support system. Participant is able to verbalize types and ability to use techniques and skills needed for reducing stress and depression.  Initial Review & Psychosocial Screening: Initial Psych Review & Screening - 05/29/17 1243      Initial Review   Comments  recent illness has effected her self confidence.  Pt is earger to learn more about a heart healthy lifestyle      Family Dynamics   Good Support System?  Yes      Barriers  Psychosocial barriers to participate in program  The patient should benefit from training in stress management and relaxation.      Screening Interventions   Interventions  Provide feedback about the scores to participant;Encouraged to exercise       Quality of Life Scores: Quality of Life - 05/29/17 1145      Quality of Life Scores   Health/Function Pre  25 %    Socioeconomic Pre  30 %    Psych/Spiritual Pre  27.43 %    Family Pre  25.5 %    GLOBAL Pre  26.64 %      Scores of 19 and below usually indicate a poorer quality of life in these  areas.  A difference of  2-3 points is a clinically meaningful difference.  A difference of 2-3 points in the total score of the Quality of Life Index has been associated with significant improvement in overall quality of life, self-image, physical symptoms, and general health in studies assessing change in quality of life.  PHQ-9: Recent Review Flowsheet Data    Depression screen [facility] 2/9 06/25/2017 02/12/2017   Decreased Interest 0 1   Down, Depressed, Hopeless 0 1   PHQ - 2 Score 0 2   Altered sleeping 1 1   Tired, decreased energy 1 1   Change in appetite 1 1   Feeling bad or failure about yourself  - 1   Trouble concentrating 0 0   Moving slowly or fidgety/restless 0 0   Suicidal thoughts 0 0   PHQ-9 Score 3 6   Difficult doing work/chores Somewhat difficult Somewhat difficult     Interpretation of Total Score  Total Score Depression Severity:  1-4 = Minimal depression, 5-9 = Mild depression, 10-14 = Moderate depression, 15-19 = Moderately severe depression, 20-27 = Severe depression   Psychosocial Evaluation and Intervention: Psychosocial Evaluation - 06/25/17 1629      Psychosocial Evaluation & Interventions   Interventions  Stress management education;Relaxation education    Comments  pt exhibits health related anxiety,especially from life changes that occurred as result of recent cardiac event.  pt concerned about current health concerns.     Expected Outcomes  pt will exhibit improved outlook with good coping skills.     Continue Psychosocial Services   Follow up required by staff       Psychosocial Re-Evaluation: Psychosocial Re-Evaluation    Row Name 03/07/17 1709 07/18/17 1729 08/16/17 1224         Psychosocial Re-Evaluation   Current issues with  Current Stress Concerns  Current Stress Concerns  Current Stress Concerns     Comments  pt with situational stress and health related anxiety.  pt displays improved mood and insight during CR sessions, pt has positive  interactions with staff and peers. pt work conflict causes tardiness to CR sessions.   pt with situational stress and health related anxiety.  pt displays improved mood and insight during CR sessions, pt has positive interactions with staff and peers. pt work conflict causes tardiness to CR sessions.   pt with situational stress and health related anxiety.  pt displays improved mood and insight during CR sessions, pt has positive interactions with staff and peers. pt work conflict causes tardiness to CR sessions.      Expected Outcomes  pt will exhibit positive outlook with good coping skills.   pt will exhibit positive outlook with good coping skills.   pt will exhibit positive outlook with good coping skills.  Interventions  Stress management education;Relaxation education;Encouraged to attend Cardiac Rehabilitation for the exercise  Stress management education;Relaxation education;Encouraged to attend Cardiac Rehabilitation for the exercise  Stress management education;Relaxation education;Encouraged to attend Cardiac Rehabilitation for the exercise     Continue Psychosocial Services   No Follow up required  No Follow up required  No Follow up required     Comments  -  -  recent illness has effected her self confidence.  Pt is earger to learn more about a heart healthy lifestyle       Initial Review   Source of Stress Concerns  -  -  Occupation        Psychosocial Discharge (Final Psychosocial Re-Evaluation): Psychosocial Re-Evaluation - 08/16/17 1224      Psychosocial Re-Evaluation   Current issues with  Current Stress Concerns    Comments  pt with situational stress and health related anxiety.  pt displays improved mood and insight during CR sessions, pt has positive interactions with staff and peers. pt work conflict causes tardiness to CR sessions.     Expected Outcomes  pt will exhibit positive outlook with good coping skills.     Interventions  Stress management education;Relaxation  education;Encouraged to attend Cardiac Rehabilitation for the exercise    Continue Psychosocial Services   No Follow up required    Comments  recent illness has effected her self confidence.  Pt is earger to learn more about a heart healthy lifestyle      Initial Review   Source of Stress Concerns  Occupation       Vocational Rehabilitation: Provide vocational rehab assistance to qualifying candidates.   Vocational Rehab Evaluation & Intervention: Vocational Rehab - 05/29/17 1242      Initial Vocational Rehab Evaluation & Intervention   Assessment shows need for Vocational Rehabilitation  No       Education: Education Goals: Education classes will be provided on a weekly basis, covering required topics. Participant will state understanding/return demonstration of topics presented.  Learning Barriers/Preferences: Learning Barriers/Preferences - 05/29/17 1151      Learning Barriers/Preferences   Learning Barriers  Sight    Learning Preferences  Audio       Education Topics: Count Your Pulse:  -Group instruction provided by verbal instruction, demonstration, patient participation and written materials to support subject.  Instructors address importance of being able to find your pulse and how to count your pulse when at home without a heart monitor.  Patients get hands on experience counting their pulse with staff help and individually.   Heart Attack, Angina, and Risk Factor Modification:  -Group instruction provided by verbal instruction, video, and written materials to support subject.  Instructors address signs and symptoms of angina and heart attacks.    Also discuss risk factors for heart disease and how to make changes to improve heart health risk factors.   Functional Fitness:  -Group instruction provided by verbal instruction, demonstration, patient participation, and written materials to support subject.  Instructors address safety measures for doing things around the  house.  Discuss how to get up and down off the floor, how to pick things up properly, how to safely get out of a chair without assistance, and balance training.   Meditation and Mindfulness:  -Group instruction provided by verbal instruction, patient participation, and written materials to support subject.  Instructor addresses importance of mindfulness and meditation practice to help reduce stress and improve awareness.  Instructor also leads participants through a meditation exercise.  Stretching for Flexibility and Mobility:  -Group instruction provided by verbal instruction, patient participation, and written materials to support subject.  Instructors lead participants through series of stretches that are designed to increase flexibility thus improving mobility.  These stretches are additional exercise for major muscle groups that are typically performed during regular warm up and cool down.   Hands Only CPR:  -Group verbal, video, and participation provides a basic overview of AHA guidelines for community CPR. Role-play of emergencies allow participants the opportunity to practice calling for help and chest compression technique with discussion of AED use.   Hypertension: -Group verbal and written instruction that provides a basic overview of hypertension including the most recent diagnostic guidelines, risk factor reduction with self-care instructions and medication management.    Nutrition I class: Heart Healthy Eating:  -Group instruction provided by PowerPoint slides, verbal discussion, and written materials to support subject matter. The instructor gives an explanation and review of the Therapeutic Lifestyle Changes diet recommendations, which includes a discussion on lipid goals, dietary fat, sodium, fiber, plant stanol/sterol esters, sugar, and the components of a well-balanced, healthy diet.   CARDIAC REHAB PHASE II EXERCISE from 03/05/2017 in [facility] CARDIAC  REHAB  Date  03/05/17  Educator  RD  Instruction Review Code (Retired)  Not applicable      Nutrition II class: Lifestyle Skills:  -Group instruction provided by [employer], verbal discussion, and written materials to support subject matter. The instructor gives an explanation and review of label reading, grocery shopping for heart health, heart healthy recipe modifications, and ways to make healthier choices when eating out.   CARDIAC REHAB PHASE II EXERCISE from 03/05/2017 in [facility] CARDIAC REHAB  Date  03/05/17  Educator  RD  Instruction Review Code (Retired)  Not applicable      Diabetes Question & Answer:  -Group instruction provided by PowerPoint slides, verbal discussion, and written materials to support subject matter. The instructor gives an explanation and review of diabetes co-morbidities, pre- and post-prandial blood glucose goals, pre-exercise blood glucose goals, signs, symptoms, and treatment of hypoglycemia and hyperglycemia, and foot care basics.   Diabetes Blitz:  -Group instruction provided by PowerPoint slides, verbal discussion, and written materials to support subject matter. The instructor gives an explanation and review of the physiology behind type 1 and type 2 diabetes, diabetes medications and rational behind using different medications, pre- and post-prandial blood glucose recommendations and Hemoglobin A1c goals, diabetes diet, and exercise including blood glucose guidelines for exercising safely.    CARDIAC REHAB PHASE II EXERCISE from 03/05/2017 in [facility] CARDIAC REHAB  Date  03/05/17  Educator  RD  Instruction Review Code (Retired)  Not applicable      Portion Distortion:  -Group instruction provided by [employer], verbal discussion, written materials, and food models to support subject matter. The instructor gives an explanation of serving size versus portion size, changes in portions sizes over the  last 20 years, and what consists of a serving from each food group.   Stress Management:  -Group instruction provided by verbal instruction, video, and written materials to support subject matter.  Instructors review role of stress in heart disease and how to cope with stress positively.     Exercising on Your Own:  -Group instruction provided by verbal instruction, power point, and written materials to support subject.  Instructors discuss benefits of exercise, components of exercise, frequency and intensity of exercise, and end points for exercise.  Also discuss use of nitroglycerin and activating EMS.  Review options of places to exercise outside of rehab.  Review guidelines for sex with heart disease.   Cardiac Drugs I:  -Group instruction provided by verbal instruction and written materials to support subject.  Instructor reviews cardiac drug classes: antiplatelets, anticoagulants, beta blockers, and statins.  Instructor discusses reasons, side effects, and lifestyle considerations for each drug class.   Cardiac Drugs II:  -Group instruction provided by verbal instruction and written materials to support subject.  Instructor reviews cardiac drug classes: angiotensin converting enzyme inhibitors (ACE-I), angiotensin II receptor blockers (ARBs), nitrates, and calcium channel blockers.  Instructor discusses reasons, side effects, and lifestyle considerations for each drug class.   Anatomy and Physiology of the Circulatory System:  Group verbal and written instruction and models provide basic cardiac anatomy and physiology, with the coronary electrical and arterial systems. Review of: AMI, Angina, Valve disease, Heart Failure, Peripheral Artery Disease, Cardiac Arrhythmia, Pacemakers, and the ICD.   Other Education:  -Group or individual verbal, written, or video instructions that support the educational goals of the cardiac rehab program.   Holiday Eating Survival Tips:  -Group  instruction provided by PowerPoint slides, verbal discussion, and written materials to support subject matter. The instructor gives patients tips, tricks, and techniques to help them not only survive but enjoy the holidays despite the onslaught of food that accompanies the holidays.   Knowledge Questionnaire Score: Knowledge Questionnaire Score - 05/29/17 1144      Knowledge Questionnaire Score   Pre Score  22/28       Core Components/Risk Factors/Patient Goals at Admission: Personal Goals and Risk Factors at Admission - 05/29/17 1136      Core Components/Risk Factors/Patient Goals on Admission    Weight Management  Obesity;Weight Loss;Yes    Intervention  Weight Management/Obesity: Establish reasonable short term and long term weight goals.;Obesity: Provide education and appropriate resources to help participant work on and attain dietary goals.    Admit Weight  231 lb 14.8 oz (105.2 kg)    Goal Weight: Short Term  225 lb (102.1 kg)    Goal Weight: Long Term  200 lb (90.7 kg)    Expected Outcomes  Short Term: Continue to assess and modify interventions until short term weight is achieved;Long Term: Adherence to nutrition and physical activity/exercise program aimed toward attainment of established weight goal;Weight Loss: Understanding of general recommendations for a balanced deficit meal plan, which promotes 1-2 lb weight loss per week and includes a negative energy balance of [phone] kcal/d    Diabetes  Yes    Intervention  Provide education about signs/symptoms and action to take for hypo/hyperglycemia.;Provide education about proper nutrition, including hydration, and aerobic/resistive exercise prescription along with prescribed medications to achieve blood glucose in normal ranges: Fasting glucose 65-99 mg/dL    Expected Outcomes  Long Term: Attainment of HbA1C < 7%.;Short Term: Participant verbalizes understanding of the signs/symptoms and immediate care of hyper/hypoglycemia, proper  foot care and importance of medication, aerobic/resistive exercise and nutrition plan for blood glucose control.    Hypertension  Yes    Intervention  Provide education on lifestyle modifcations including regular physical activity/exercise, weight management, moderate sodium restriction and increased consumption of fresh fruit, vegetables, and low fat dairy, alcohol moderation, and smoking cessation.;Monitor prescription use compliance.    Expected Outcomes  Short Term: Continued assessment and intervention until BP is < 140/72mm HG in hypertensive participants. < 130/13mm HG in hypertensive participants with diabetes, heart failure or  chronic kidney disease.;Long Term: Maintenance of blood pressure at goal levels.    Lipids  Yes    Intervention  Provide education and support for participant on nutrition & aerobic/resistive exercise along with prescribed medications to achieve LDL 70mg , HDL >40mg .    Expected Outcomes  Short Term: Participant states understanding of desired cholesterol values and is compliant with medications prescribed. Participant is following exercise prescription and nutrition guidelines.;Long Term: Cholesterol controlled with medications as prescribed, with individualized exercise RX and with personalized nutrition plan. Value goals: LDL < 70mg , HDL > 40 mg.    Stress  Yes    Intervention  Offer individual and/or small group education and counseling on adjustment to heart disease, stress management and health-related lifestyle change. Teach and support self-help strategies.;Refer participants experiencing significant psychosocial distress to appropriate mental health specialists for further evaluation and treatment. When possible, include family members and significant others in education/counseling sessions.    Expected Outcomes  Short Term: Participant demonstrates changes in health-related behavior, relaxation and other stress management skills, ability to obtain effective social  support, and compliance with psychotropic medications if prescribed.;Long Term: Emotional wellbeing is indicated by absence of clinically significant psychosocial distress or social isolation.       Core Components/Risk Factors/Patient Goals Review:  Goals and Risk Factor Review    Row Name 03/07/17 1704 03/20/17 0733 07/18/17 1718 08/16/17 1223       Core Components/Risk Factors/Patient Goals Review   Personal Goals Review  Weight Management/Obesity;Diabetes;Other  Weight Management/Obesity;Diabetes;Other  Weight Management/Obesity;Diabetes;Other  Weight Management/Obesity;Diabetes;Other    Review  pt with CAD RF demonstrates willingness to participate in CR exercise, nutrition and education opportunities. pt has work barriers that distract her ability to participate in CR sessions.   pt has requested transfer to [facility]. pt scheduled to begin 03/21/2017.    pt demonstrates poor diabetes management with knowledge deficit of diabetes self care.   pt frequently unable to exercise due to hyperglycemia. pt has upcoming endocrinology consult 08/08/2017.   pt demonstrates poor diabetes management with knowledge deficit of diabetes self care.  pt ability to exercise has improved, however attendance sporadic.   unfortunately pt cancelled scheduled endocrinology appointment.      Expected Outcomes  pt will participate in CR exercise, nutrition, diabetes control and lifestyle modifications to decrease overall RF.    pt will participate in CR exercise, nutrition, diabetes control and lifestyle modifications to decrease overall RF.    pt will participate in CR exercise, nutrition, diabetes control and lifestyle modifications to decrease overall RF.    pt will participate in CR exercise, nutrition, diabetes control and lifestyle modifications to decrease overall RF.         Core Components/Risk Factors/Patient Goals at Discharge (Final Review):  Goals and Risk Factor Review - 08/16/17 1223       Core Components/Risk Factors/Patient Goals Review   Personal Goals Review  Weight Management/Obesity;Diabetes;Other    Review  pt demonstrates poor diabetes management with knowledge deficit of diabetes self care.  pt ability to exercise has improved, however attendance sporadic.   unfortunately pt cancelled scheduled endocrinology appointment.      Expected Outcomes  pt will participate in CR exercise, nutrition, diabetes control and lifestyle modifications to decrease overall RF.         ITP Comments: ITP Comments    Row Name 03/07/17 1703 05/29/17 9604 06/22/17 1154 06/25/17 1618 08/16/17 1220   ITP Comments  Medical Director, Dr. Armanda Magic  Medical  Director, Dr. Armanda Magicraci Turner  pt scheduled to begin group exercise classes. Multiple complicating factors have prevented her starting on scheduled start date including recent ER evaluation.  PCP clearance pending.   pt started group exercise class today. pt evaluated by cardiology today.  30 day ITP review.  pt with sporadic attendance.  pt with persistent poor diabetes self managment skills, including maintaining glycemic control and keeping scheduled endocrinology consult appoinments      Comments:

## 2017-08-16 NOTE — Progress Notes (Signed)
This encounter was created in error - please disregard.

## 2017-08-20 ENCOUNTER — Encounter (HOSPITAL_COMMUNITY)
Admission: RE | Admit: 2017-08-20 | Discharge: 2017-08-20 | Disposition: A | Discharge status: Home / Self Care | Payer: BLUE CROSS/BLUE SHIELD | Source: Ambulatory Visit | Attending: Cardiology | Admitting: Cardiology

## 2017-08-20 DIAGNOSIS — I252 Old myocardial infarction: Secondary | ICD-10-CM | POA: Diagnosis not present

## 2017-08-20 DIAGNOSIS — Z955 Presence of coronary angioplasty implant and graft: Secondary | ICD-10-CM

## 2017-08-20 DIAGNOSIS — I214 Non-ST elevation (NSTEMI) myocardial infarction: Secondary | ICD-10-CM

## 2017-08-20 LAB — GLUCOSE, CAPILLARY: Glucose-Capillary: 166 mg/dL — ABNORMAL HIGH (ref 65–99)

## 2017-08-21 LAB — GLUCOSE, CAPILLARY: GLUCOSE-CAPILLARY: 157 mg/dL — AB (ref 65–99)

## 2017-08-22 ENCOUNTER — Encounter (HOSPITAL_COMMUNITY)
Admission: RE | Admit: 2017-08-22 | Discharge: 2017-08-22 | Disposition: A | Discharge status: Home / Self Care | Payer: BLUE CROSS/BLUE SHIELD | Source: Ambulatory Visit | Attending: Cardiology | Admitting: Cardiology

## 2017-08-22 DIAGNOSIS — I214 Non-ST elevation (NSTEMI) myocardial infarction: Secondary | ICD-10-CM

## 2017-08-22 DIAGNOSIS — Z955 Presence of coronary angioplasty implant and graft: Secondary | ICD-10-CM

## 2017-08-22 DIAGNOSIS — I252 Old myocardial infarction: Secondary | ICD-10-CM | POA: Diagnosis not present

## 2017-08-23 LAB — GLUCOSE, CAPILLARY: Glucose-Capillary: 193 mg/dL — ABNORMAL HIGH (ref 65–99)

## 2017-08-27 ENCOUNTER — Encounter (HOSPITAL_COMMUNITY)
Admission: RE | Admit: 2017-08-27 | Discharge: 2017-08-27 | Disposition: A | Discharge status: Home / Self Care | Payer: BLUE CROSS/BLUE SHIELD | Source: Ambulatory Visit | Attending: Cardiology | Admitting: Cardiology

## 2017-08-27 DIAGNOSIS — F419 Anxiety disorder, unspecified: Secondary | ICD-10-CM | POA: Diagnosis not present

## 2017-08-27 DIAGNOSIS — K219 Gastro-esophageal reflux disease without esophagitis: Secondary | ICD-10-CM | POA: Insufficient documentation

## 2017-08-27 DIAGNOSIS — E039 Hypothyroidism, unspecified: Secondary | ICD-10-CM | POA: Insufficient documentation

## 2017-08-27 DIAGNOSIS — E785 Hyperlipidemia, unspecified: Secondary | ICD-10-CM | POA: Insufficient documentation

## 2017-08-27 DIAGNOSIS — G4730 Sleep apnea, unspecified: Secondary | ICD-10-CM | POA: Insufficient documentation

## 2017-08-27 DIAGNOSIS — I252 Old myocardial infarction: Secondary | ICD-10-CM | POA: Insufficient documentation

## 2017-08-27 DIAGNOSIS — I10 Essential (primary) hypertension: Secondary | ICD-10-CM | POA: Insufficient documentation

## 2017-08-27 DIAGNOSIS — Z7984 Long term (current) use of oral hypoglycemic drugs: Secondary | ICD-10-CM | POA: Insufficient documentation

## 2017-08-27 DIAGNOSIS — Z7951 Long term (current) use of inhaled steroids: Secondary | ICD-10-CM | POA: Insufficient documentation

## 2017-08-27 DIAGNOSIS — I214 Non-ST elevation (NSTEMI) myocardial infarction: Secondary | ICD-10-CM

## 2017-08-27 DIAGNOSIS — Z79899 Other long term (current) drug therapy: Secondary | ICD-10-CM | POA: Diagnosis not present

## 2017-08-27 DIAGNOSIS — Z7982 Long term (current) use of aspirin: Secondary | ICD-10-CM | POA: Diagnosis not present

## 2017-08-27 DIAGNOSIS — E119 Type 2 diabetes mellitus without complications: Secondary | ICD-10-CM | POA: Diagnosis not present

## 2017-08-27 DIAGNOSIS — M1990 Unspecified osteoarthritis, unspecified site: Secondary | ICD-10-CM | POA: Diagnosis not present

## 2017-08-27 DIAGNOSIS — Z955 Presence of coronary angioplasty implant and graft: Secondary | ICD-10-CM | POA: Diagnosis not present

## 2017-08-27 DIAGNOSIS — I2510 Atherosclerotic heart disease of native coronary artery without angina pectoris: Secondary | ICD-10-CM | POA: Insufficient documentation

## 2017-08-28 LAB — GLUCOSE, CAPILLARY: GLUCOSE-CAPILLARY: 229 mg/dL — AB (ref 65–99)

## 2017-08-29 ENCOUNTER — Encounter (HOSPITAL_COMMUNITY)
Admission: RE | Admit: 2017-08-29 | Discharge: 2017-08-29 | Disposition: A | Discharge status: Home / Self Care | Payer: BLUE CROSS/BLUE SHIELD | Source: Ambulatory Visit | Attending: Cardiology | Admitting: Cardiology

## 2017-08-29 VITALS — Ht 65.0 in | Wt 229.9 lb

## 2017-08-29 DIAGNOSIS — I214 Non-ST elevation (NSTEMI) myocardial infarction: Secondary | ICD-10-CM

## 2017-08-29 DIAGNOSIS — I252 Old myocardial infarction: Secondary | ICD-10-CM | POA: Diagnosis not present

## 2017-08-29 DIAGNOSIS — Z955 Presence of coronary angioplasty implant and graft: Secondary | ICD-10-CM

## 2017-08-29 LAB — GLUCOSE, CAPILLARY: GLUCOSE-CAPILLARY: 231 mg/dL — AB (ref 65–99)

## 2017-09-03 ENCOUNTER — Encounter (HOSPITAL_COMMUNITY)
Admission: RE | Admit: 2017-09-03 | Discharge: 2017-09-03 | Disposition: A | Discharge status: Home / Self Care | Payer: BLUE CROSS/BLUE SHIELD | Source: Ambulatory Visit | Attending: Cardiology | Admitting: Cardiology

## 2017-09-03 DIAGNOSIS — Z955 Presence of coronary angioplasty implant and graft: Secondary | ICD-10-CM

## 2017-09-03 DIAGNOSIS — I252 Old myocardial infarction: Secondary | ICD-10-CM | POA: Diagnosis not present

## 2017-09-03 DIAGNOSIS — I214 Non-ST elevation (NSTEMI) myocardial infarction: Secondary | ICD-10-CM

## 2017-09-03 LAB — GLUCOSE, CAPILLARY: GLUCOSE-CAPILLARY: 175 mg/dL — AB (ref 65–99)

## 2017-09-05 ENCOUNTER — Encounter (HOSPITAL_COMMUNITY)
Admission: RE | Admit: 2017-09-05 | Discharge: 2017-09-05 | Disposition: A | Discharge status: Home / Self Care | Payer: BLUE CROSS/BLUE SHIELD | Source: Ambulatory Visit | Attending: Cardiology | Admitting: Cardiology

## 2017-09-05 ENCOUNTER — Encounter (HOSPITAL_COMMUNITY): Payer: Self-pay

## 2017-09-05 DIAGNOSIS — Z955 Presence of coronary angioplasty implant and graft: Secondary | ICD-10-CM

## 2017-09-05 DIAGNOSIS — I252 Old myocardial infarction: Secondary | ICD-10-CM | POA: Diagnosis not present

## 2017-09-05 DIAGNOSIS — I214 Non-ST elevation (NSTEMI) myocardial infarction: Secondary | ICD-10-CM

## 2017-09-11 ENCOUNTER — Encounter: Payer: Self-pay | Admitting: Emergency Medicine

## 2017-09-11 ENCOUNTER — Ambulatory Visit (INDEPENDENT_AMBULATORY_CARE_PROVIDER_SITE_OTHER): Payer: BLUE CROSS/BLUE SHIELD | Admitting: Emergency Medicine

## 2017-09-11 VITALS — BP 150/96 | HR 84 | Ht 65.0 in | Wt 228.0 lb

## 2017-09-11 DIAGNOSIS — K219 Gastro-esophageal reflux disease without esophagitis: Secondary | ICD-10-CM

## 2017-09-11 DIAGNOSIS — J4520 Mild intermittent asthma, uncomplicated: Secondary | ICD-10-CM

## 2017-09-11 DIAGNOSIS — J309 Allergic rhinitis, unspecified: Secondary | ICD-10-CM | POA: Diagnosis not present

## 2017-09-11 DIAGNOSIS — R0600 Dyspnea, unspecified: Secondary | ICD-10-CM

## 2017-09-11 DIAGNOSIS — G4733 Obstructive sleep apnea (adult) (pediatric): Secondary | ICD-10-CM | POA: Diagnosis not present

## 2017-09-11 MED ORDER — ESOMEPRAZOLE MAGNESIUM 40 MG PO CPDR: 40.0000 mg | DELAYED_RELEASE_CAPSULE | Freq: Every day | ORAL | 5 refills | Status: AC

## 2017-09-11 NOTE — Assessment & Plan Note (Signed)
Suspect largely due to restrictive lung disease. Methacholine should help us sort out.

## 2017-09-11 NOTE — Progress Notes (Signed)
Subjective:    Patient ID: Cynthia Kirby, female    DOB: 09-03-1970, 47 y.o.   MRN: [id number]  HPI 47 year old obese never smoker with a history of coronary artery disease, allergic rhinitis, diabetes, GERD, rhinitis, obstructive sleep apnea.  She has been followed by Dr. Jamison NeighborNestor for intermittent dyspnea.  She has spirometry that I have reviewed from 03/2017 that show normal airflows with restriction.  She was seen for an acute visit here 07/25/17 and was started empirically on Asmanex when she was complaining of upper airway symptoms and dyspnea.  The asmanex made her cough so she stopped. Also started Zyrtec, continue Singulair, increased Zantac at that time. She still has breakthrough reflux. She still has rhinitis on singulair, flonase. She has intermittent dyspnea. Was given albuterol inhaler by her PCP > uses it about 3-4x week, unclear whether it helps.                                                                                                                                                                                                                                                                                                                                                                                                                                                           Review of Systems  Past Medical History:  Diagnosis Date  .  Allergic rhinitis   . Anemia   . Anxiety   . Arthritis    "left ankle; knees" (11/13/2016)  . CAD in native artery 11/12/2016   A. NSTEMI with LHC 11/13/16 RCA 50%, Circ 40%, 2nd OM 40%, 2nd diag 70%, Mid LAD 95%- DES placed. EF normal. b. Repeat nuc 11/2016 was normal.  . Depression    "hx; not now" (11/13/2016)  . GERD (gastroesophageal reflux disease)   . Headache    "weekly" (11/13/2016)  . Hyperlipidemia   . Hypertension   . Hypothyroidism   . Migraine    "weekly" (11/13/2016)  . Sleep apnea    "CPAP RX'd; never used" (11/13/2016)  . Type II  diabetes mellitus (HCC)      Family History  Problem Relation Age of Onset  . Hypertension Mother   . Diabetes Mother   . Hypertension Father   . Diabetes Father        borderline  . Prostate cancer Father   . Healthy Sister   . Healthy Brother   . Lung disease Neg Hx      Social History   Socioeconomic History  . Marital status: Single    Spouse name: Not on file  . Number of children: Not on file  . Years of education: Not on file  . Highest education level: Not on file  Social Needs  . Financial resource strain: Not hard at all  . Food insecurity - worry: Never true  . Food insecurity - inability: Never true  . Transportation needs - medical: No  . Transportation needs - non-medical: No  Occupational History  . Occupation: Network engineer of business, Print production planner  Tobacco Use  . Smoking status: Passive Smoke Exposure - Never Smoker  . Smokeless tobacco: Never Used  . Tobacco comment: remote smoking socially in her 75s - "maybe 5 todal  Substance and Sexual Activity  . Alcohol use: Yes    Comment: 11/13/2016 "might have a couple drinks/year"  . Drug use: No  . Sexual activity: Yes    Birth control/protection: None  Other Topics Concern  . Not on file  Social History Narrative   [facility] (01/02/17):   Originally from [place]. She moved to [facility] in 2013. She is a Scientist, research (life sciences). Her parents retired to [employer]. 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 She reports remote exposure to a cockatiel & parakeets. She has questionable recent exposure to mold. No hot tub exposure. Enjoys writing music.      Allergies  Allergen Reactions  . Penicillins Anaphylaxis and Itching  . Dapagliflozin     Other reaction(s): Other Genital mycotic infections  . Orange Oil Swelling    Tongue swelling.   . Lisinopril     Other reaction(s): COUGH  . Penicillin G Swelling     Outpatient Medications Prior to Visit  Medication Sig Dispense Refill  .  amLODipine (NORVASC) 10 MG tablet Take 1 tablet (10 mg total) by mouth daily. 90 tablet 3  . aspirin 81 MG chewable tablet Chew 1 tablet (81 mg total) by mouth daily. 30 tablet 11  . cyanocobalamin 500 MCG tablet Take 500 mcg by mouth daily.    . Echinacea 125 MG CAPS Take 1 capsule by mouth daily.     . fluticasone (FLONASE) 50 MCG/ACT nasal spray Place 2 sprays into both nostrils daily.    . insulin glargine (LANTUS) 100 UNIT/ML injection Inject 14 Units into the skin  at bedtime.    Marland Kitchen losartan (COZAAR) 100 MG tablet Take 1 tablet (100 mg total) by mouth daily. 90 tablet 3  . metFORMIN (GLUCOPHAGE) 1000 MG tablet Take 1,000 mg by mouth 2 (two) times daily with a meal.     . metoprolol succinate (TOPROL-XL) 50 MG 24 hr tablet Take 50 mg by mouth daily. Take with or immediately following a meal.    . montelukast (SINGULAIR) 10 MG tablet Take 1 tablet (10 mg total) by mouth at bedtime. 30 tablet 3  . nitroGLYCERIN (NITROSTAT) 0.4 MG SL tablet Place 1 tablet (0.4 mg total) under the tongue every 5 (five) minutes x 3 doses as needed for chest pain. 25 tablet 3  . ondansetron (ZOFRAN) 8 MG tablet Take 8 mg by mouth every 8 (eight) hours as needed for nausea or vomiting.     . pantoprazole (PROTONIX) 40 MG tablet Take 1 tablet by mouth 2 (two) times daily as needed (acid reflux).   0  . prasugrel (EFFIENT) 10 MG TABS tablet Take 1 tablet (10 mg total) by mouth daily. 90 tablet 3  . ranitidine (ZANTAC) 150 MG tablet Take 1 tablet (150 mg total) by mouth 2 (two) times daily. 30 tablet 3  . rosuvastatin (CRESTOR) 10 MG tablet Take 1 tablet (10 mg total) by mouth daily. 90 tablet 3  . spironolactone (ALDACTONE) 25 MG tablet Take 1 tablet (25 mg total) by mouth daily. 90 tablet 3  . zinc sulfate 220 (50 Zn) MG capsule Take 220 mg by mouth daily.     No facility-administered medications prior to visit.         Objective:   Physical Exam Vitals:   09/11/17 1119  BP: (!) 150/96  Pulse: 84  SpO2: 98%    Weight: 228 lb (103.4 kg)  Height: 5\' 5"  (1.651 m)   Gen: Pleasant, well-nourished, in no distress,  normal affect  ENT: No lesions,  mouth clear,  oropharynx clear, no postnasal drip  Neck: No JVD, no stridor  Lungs: No use of accessory muscles, clear without rales or rhonchi  Cardiovascular: RRR, heart sounds normal, no murmur or gallops, no peripheral edema  Musculoskeletal: No deformities, no cyanosis or clubbing  Neuro: alert, non focal  Skin: Warm, no lesions or rash     Assessment & Plan:  Asthma Unclear diagnosis.  She stopped Asmanex due to cough.  I believe she needs a methacholine challenge to sort this out.  OSA (obstructive sleep apnea) Mild obstructive sleep apnea noted on home sleep study.  It was in the supine position for the most part.  She has been asked to try sleeping on her side.  Chronic allergic rhinitis Still some breakthrough.  Continue Singulair, Flonase, Zyrtec.  GERD (gastroesophageal reflux disease) Continues to have reflux symptoms and I suspect that this is the largest contributor to her upper airway irritation and cough.  Continue Zantac twice a day, add Nexium  Dyspnea Suspect largely due to restrictive lung disease. Methacholine should help [place] sort out.   Levy Pupa, MD, PhD 09/11/2017, 11:58 AM [facility] [phone] or if no answer [phone]

## 2017-09-11 NOTE — Assessment & Plan Note (Signed)
Mild obstructive sleep apnea noted on home sleep study.  It was in the supine position for the most part.  She has been asked to try sleeping on her side.

## 2017-09-11 NOTE — Assessment & Plan Note (Signed)
Unclear diagnosis.  She stopped Asmanex due to cough.  I believe she needs a methacholine challenge to sort this out.

## 2017-09-11 NOTE — Assessment & Plan Note (Signed)
Continues to have reflux symptoms and I suspect that this is the largest contributor to her upper airway irritation and cough.  Continue Zantac twice a day, add Nexium

## 2017-09-11 NOTE — Patient Instructions (Signed)
We will perform a methacholine challenge pulmonary function test to better evaluate for possible asthma. Keep your albuterol inhaler available to use 2 puffs if needed for shortness of breath, wheezing, chest tightness for now. Agree with stopping Asmanex. Continue Singulair , Flonase nasal spray and Zyrtec as you have been taking them. Continue your Zantac twice a day for now. Start taking Nexium 40 mg daily until next visit.  Pay attention to whether this helps your reflux. Agree with sleeping on your side, avoid sleeping on your back. Follow with Dr Delton CoombesByrum next available after your testing to review the results together.

## 2017-09-11 NOTE — Assessment & Plan Note (Signed)
Still some breakthrough.  Continue Singulair, Flonase, Zyrtec.

## 2017-09-17 NOTE — Progress Notes (Signed)
Discharge Progress Report  Patient Details  Name: Cynthia Kirby MRN: [id number] Date of Birth: 03-26-1971 Referring Provider:   Flowsheet Row CARDIAC REHAB PHASE II ORIENTATION from 05/29/2017 in [facility] CARDIAC REHAB  Referring Provider  Armanda Magic, MD.       Number of Visits: 24   Reason for Discharge:  Patient reached a stable level of exercise.  Smoking History:  Social History   Tobacco Use  Smoking Status Passive Smoke Exposure - Never Smoker  Smokeless Tobacco Never Used  Tobacco Comment   remote smoking socially in her 55s - "maybe 5 todal    Diagnosis:  11/13/16 Status post coronary artery stent placement  11/12/16 NSTEMI (non-ST elevated myocardial infarction) (HCC)  ADL [facility]:   Initial Exercise Prescription: Initial Exercise Prescription - 05/29/17 1100    Date of Initial Exercise RX and Referring Provider          Date  05/29/17    Referring Provider  Armanda Magic, MD.        Treadmill          MPH  2.2    Grade  1    Minutes  15    METs  2.99        Recumbant Bike          Level  3    Minutes  10    METs  2.9        NuStep          Level  3    SPM  85    Minutes  15    METs  2.9        Prescription Details          Frequency (times per week)  3    Duration  Progress to 30 minutes of continuous aerobic without signs/symptoms of physical distress        Intensity          THRR 40-80% of Max Heartrate  70-141    Ratings of Perceived Exertion  11-13    Perceived Dyspnea  0-4        Progression          Progression  Continue progressive overload as per policy without signs/symptoms or physical distress.        Resistance Training          Training Prescription  Yes    Weight  3lbs    Reps  10-15           Discharge Exercise Prescription (Final Exercise Prescription Changes): Exercise Prescription Changes - 09/05/17 1542    Response to Exercise          Blood Pressure (Admit)  130/80    Blood  Pressure (Exercise)  142/88    Blood Pressure (Exit)  126/83    Heart Rate (Admit)  89 bpm    Heart Rate (Exercise)  131 bpm    Heart Rate (Exit)  86 bpm    Rating of Perceived Exertion (Exercise)  11    Symptoms  none    Duration  Continue with 30 min of aerobic exercise without signs/symptoms of physical distress.    Intensity  THRR unchanged        Progression          Progression  Continue to progress workloads to maintain intensity without signs/symptoms of physical distress.    Average METs  3.5        Resistance Training  Training Prescription  No relaxation    Time  10 Minutes        Treadmill          MPH  3    Grade  2    Minutes  15    METs  4.12        NuStep          Level  4    SPM  75    Minutes  15    METs  2.8        Home Exercise Plan          Plans to continue exercise at  Home (comment) walking    Frequency  Add 2 additional days to program exercise sessions.    Initial Home Exercises Provided  07/09/17           Functional Capacity: 6 Minute Walk    6 Minute Walk    Row Name 05/29/17 1130 08/29/17 1624   Phase  Initial  Discharge   Distance  1400 feet  1689 feet   Distance % Change  no documentation  20.64 %   Distance Feet Change  no documentation  289 ft   Walk Time  6 minutes  6 minutes   # of Rest Breaks  0  0   MPH  2.65  3.2   METS  4.39  4.8   RPE  13  15   VO2 Peak  15.36  16.85   Symptoms  No  No   Resting HR  99 bpm  98 bpm   Resting BP  130/94  124/84   Resting Oxygen Saturation   99 %  no documentation   Max Ex. HR  132 bpm  127 bpm   Max Ex. BP  154/94  150/84   2 Minute Post BP  120/80  138/80          Psychological, QOL, Others - Outcomes: PHQ 2/9: Depression screen [facility] 2/9 09/05/2017 06/25/2017 02/12/2017  Decreased Interest 1 0 1  Down, Depressed, Hopeless 1 0 1  PHQ - 2 Score 2 0 2  Altered sleeping 2 1 1   Tired, decreased energy 2 1 1   Change in appetite 1 1 1   Feeling bad or failure  about yourself  0 - 1  Trouble concentrating 1 0 0  Moving slowly or fidgety/restless 1 0 0  Suicidal thoughts 0 0 0  PHQ-9 Score 9 3 6   Difficult doing work/chores Somewhat difficult Somewhat difficult Somewhat difficult    Quality of Life: Quality of Life - 09/13/17 1520    Quality of Life Scores          Health/Function Pre  25 %    Health/Function Post  19.15 %    Health/Function % Change  -23.4 %    Socioeconomic Pre  30 %    Socioeconomic Post  29.14 %    Socioeconomic % Change   -2.87 %    Psych/Spiritual Pre  27.43 %    Psych/Spiritual Post  22 %    Psych/Spiritual % Change  -19.8 %    Family Pre  25.5 %    Family Post  26.4 %    Family % Change  3.53 %    GLOBAL Pre  26.64 %    GLOBAL Post  23.09 %    GLOBAL % Change  -13.33 %           Personal Goals: Goals established at  orientation with interventions provided to work toward goal. Personal Goals and Risk Factors at Admission - 05/29/17 1136    Core Components/Risk Factors/Patient Goals on Admission           Weight Management  Obesity;Weight Loss;Yes    Intervention  Weight Management/Obesity: Establish reasonable short term and long term weight goals.;Obesity: Provide education and appropriate resources to help participant work on and attain dietary goals.    Admit Weight  231 lb 14.8 oz (105.2 kg)    Goal Weight: Short Term  225 lb (102.1 kg)    Goal Weight: Long Term  200 lb (90.7 kg)    Expected Outcomes  Short Term: Continue to assess and modify interventions until short term weight is achieved;Long Term: Adherence to nutrition and physical activity/exercise program aimed toward attainment of established weight goal;Weight Loss: Understanding of general recommendations for a balanced deficit meal plan, which promotes 1-2 lb weight loss per week and includes a negative energy balance of [phone] kcal/d    Diabetes  Yes    Intervention  Provide education about signs/symptoms and action to take for  hypo/hyperglycemia.;Provide education about proper nutrition, including hydration, and aerobic/resistive exercise prescription along with prescribed medications to achieve blood glucose in normal ranges: Fasting glucose 65-99 mg/dL    Expected Outcomes  Long Term: Attainment of HbA1C < 7%.;Short Term: Participant verbalizes understanding of the signs/symptoms and immediate care of hyper/hypoglycemia, proper foot care and importance of medication, aerobic/resistive exercise and nutrition plan for blood glucose control.    Hypertension  Yes    Intervention  Provide education on lifestyle modifcations including regular physical activity/exercise, weight management, moderate sodium restriction and increased consumption of fresh fruit, vegetables, and low fat dairy, alcohol moderation, and smoking cessation.;Monitor prescription use compliance.    Expected Outcomes  Short Term: Continued assessment and intervention until BP is < 140/10mm HG in hypertensive participants. < 130/62mm HG in hypertensive participants with diabetes, heart failure or chronic kidney disease.;Long Term: Maintenance of blood pressure at goal levels.    Lipids  Yes    Intervention  Provide education and support for participant on nutrition & aerobic/resistive exercise along with prescribed medications to achieve LDL 70mg , HDL >40mg .    Expected Outcomes  Short Term: Participant states understanding of desired cholesterol values and is compliant with medications prescribed. Participant is following exercise prescription and nutrition guidelines.;Long Term: Cholesterol controlled with medications as prescribed, with individualized exercise RX and with personalized nutrition plan. Value goals: LDL < 70mg , HDL > 40 mg.    Stress  Yes    Intervention  Offer individual and/or small group education and counseling on adjustment to heart disease, stress management and health-related lifestyle change. Teach and support self-help strategies.;Refer  participants experiencing significant psychosocial distress to appropriate mental health specialists for further evaluation and treatment. When possible, include family members and significant others in education/counseling sessions.    Expected Outcomes  Short Term: Participant demonstrates changes in health-related behavior, relaxation and other stress management skills, ability to obtain effective social support, and compliance with psychotropic medications if prescribed.;Long Term: Emotional wellbeing is indicated by absence of clinically significant psychosocial distress or social isolation.            Personal Goals Discharge: Goals and Risk Factor Review    Core Components/Risk Factors/Patient Goals Review    Row Name 07/18/17 1718 08/16/17 1223 09/05/17 1630   Personal Goals Review  Weight Management/Obesity;Diabetes;Other  Weight Management/Obesity;Diabetes;Other  Weight Management/Obesity;Diabetes;Other   Review  pt demonstrates  poor diabetes management with knowledge deficit of diabetes self care.   pt frequently unable to exercise due to hyperglycemia. pt has upcoming endocrinology consult 08/08/2017.   pt demonstrates poor diabetes management with knowledge deficit of diabetes self care.  pt ability to exercise has improved, however attendance sporadic.   unfortunately pt cancelled scheduled endocrinology appointment.    pt completed CR program with 24 sessions. pt demonstrated proper CBG monitoring and has brought daily CBG values down to around 200. Endocrine consult scheduled 10/06/2017. pt encouraged to continue diabetes self care mangement with close physician(PCP, Cardiology, Endocrinology) followup to slow disease progression.  pt encouraged to continue exercise. pt plans to go to [employer].    Expected Outcomes  pt will participate in CR exercise, nutrition, diabetes control and lifestyle modifications to decrease overall RF.    pt will participate in CR exercise, nutrition, diabetes  control and lifestyle modifications to decrease overall RF.    pt will participate in exercise, nutrition, diabetes control and lifestyle modifications to decrease overall RF.            Exercise Goals and Review: Exercise Goals    Exercise Goals    Row Name 05/29/17 0943   Increase Physical Activity  Yes   Intervention  Provide advice, education, support and counseling about physical activity/exercise needs.;Develop an individualized exercise prescription for aerobic and resistive training based on initial evaluation findings, risk stratification, comorbidities and participant's personal goals.   Expected Outcomes  Achievement of increased cardiorespiratory fitness and enhanced flexibility, muscular endurance and strength shown through measurements of functional capacity and personal statement of participant.   Increase Strength and Stamina  Yes improve cardiovascular fitness and stamina. Develop exercise routine   Intervention  Provide advice, education, support and counseling about physical activity/exercise needs.;Develop an individualized exercise prescription for aerobic and resistive training based on initial evaluation findings, risk stratification, comorbidities and participant's personal goals.   Expected Outcomes  Achievement of increased cardiorespiratory fitness and enhanced flexibility, muscular endurance and strength shown through measurements of functional capacity and personal statement of participant.   Able to understand and use rate of perceived exertion (RPE) scale  Yes   Intervention  Provide education and explanation on how to use RPE scale   Expected Outcomes  Short Term: Able to use RPE daily in rehab to express subjective intensity level;Long Term:  Able to use RPE to guide intensity level when exercising independently   Knowledge and understanding of Target Heart Rate Range (THRR)  Yes   Intervention  Provide education and explanation of THRR including how the numbers  were predicted and where they are located for reference   Expected Outcomes  Short Term: Able to state/look up THRR;Long Term: Able to use THRR to govern intensity when exercising independently;Short Term: Able to use daily as guideline for intensity in rehab   Able to check pulse independently  Yes   Intervention  Provide education and demonstration on how to check pulse in carotid and radial arteries.;Review the importance of being able to check your own pulse for safety during independent exercise   Expected Outcomes  Short Term: Able to explain why pulse checking is important during independent exercise;Long Term: Able to check pulse independently and accurately   Understanding of Exercise Prescription  Yes   Intervention  Provide education, explanation, and written materials on patient's individual exercise prescription   Expected Outcomes  Short Term: Able to explain program exercise prescription;Long Term: Able to explain home exercise prescription to  exercise independently          Nutrition & Weight - Outcomes: Pre Biometrics - 05/29/17 1141    Pre Biometrics          Height  5\' 5"  (1.651 m)    Weight  231 lb 14.8 oz (105.2 kg)    Waist Circumference  42 inches    Hip Circumference  50 inches    Waist to Hip Ratio  0.84 %    BMI (Calculated)  38.59    Triceps Skinfold  47 mm    % Body Fat  48.1 %    Grip Strength  35 kg    Flexibility  11.5 in    Single Leg Stand  12.09 seconds          Post Biometrics - 08/29/17 1627     Post  Biometrics          Height  5\' 5"  (1.651 m)    Weight  229 lb 15 oz (104.3 kg)    Waist Circumference  42.5 inches    Hip Circumference  51 inches    Waist to Hip Ratio  0.83 %    BMI (Calculated)  38.26    Triceps Skinfold  44 mm    % Body Fat  47.8 %    Grip Strength  39 kg    Flexibility  13 in    Single Leg Stand  20 seconds           Nutrition:   Nutrition Discharge: Nutrition Assessments - 09/05/17 1539    MEDFICTS Scores           Pre Score  28    Post Score  35    Score Difference  7           Education Questionnaire Score: Knowledge Questionnaire Score - 09/13/17 1519    Knowledge Questionnaire Score          Post Score  19/24           Goals reviewed with patient; copy given to patient.

## 2017-09-18 ENCOUNTER — Other Ambulatory Visit: Payer: Self-pay | Admitting: Emergency Medicine

## 2017-09-18 MED ORDER — RANITIDINE HCL 150 MG PO TABS: 150.0000 mg | ORAL_TABLET | Freq: Two times a day (BID) | ORAL | 3 refills | Status: DC

## 2017-09-24 ENCOUNTER — Other Ambulatory Visit: Payer: Self-pay | Admitting: *Deleted

## 2017-09-24 MED ORDER — MONTELUKAST SODIUM 10 MG PO TABS: 10.0000 mg | ORAL_TABLET | Freq: Every day | ORAL | 5 refills | Status: DC

## 2017-10-01 ENCOUNTER — Encounter (HOSPITAL_COMMUNITY): Payer: BLUE CROSS/BLUE SHIELD

## 2017-10-08 ENCOUNTER — Ambulatory Visit (HOSPITAL_COMMUNITY)
Admission: RE | Admit: 2017-10-08 | Discharge: 2017-10-08 | Disposition: A | Discharge status: Home / Self Care | Payer: BLUE CROSS/BLUE SHIELD | Source: Ambulatory Visit | Attending: Emergency Medicine | Admitting: Emergency Medicine

## 2017-10-08 DIAGNOSIS — J4520 Mild intermittent asthma, uncomplicated: Secondary | ICD-10-CM | POA: Insufficient documentation

## 2017-10-08 LAB — PULMONARY FUNCTION TEST
FEF 25-75 PRE: 3.62 L/s
FEF 25-75 Post: 4.7 L/sec
FEF2575-%CHANGE-POST: 29 %
FEF2575-%Pred-Post: 155 %
FEF2575-%Pred-Pre: 119 %
FEV1-%CHANGE-POST: 8 %
FEV1-%PRED-POST: 89 %
FEV1-%Pred-Pre: 82 %
FEV1-Post: 2.7 L
FEV1-Pre: 2.49 L
FEV1FVC-%Change-Post: 0 %
FEV1FVC-%Pred-Pre: 110 %
FEV6-%Change-Post: 7 %
FEV6-%PRED-POST: 81 %
FEV6-%Pred-Pre: 75 %
FEV6-POST: 2.99 L
FEV6-Pre: 2.78 L
FEV6FVC-%PRED-PRE: 102 %
FEV6FVC-%Pred-Post: 102 %
FVC-%CHANGE-POST: 7 %
FVC-%Pred-Post: 79 %
FVC-%Pred-Pre: 74 %
FVC-Post: 2.99 L
FVC-Pre: 2.78 L
POST FEV1/FVC RATIO: 90 %
PRE FEV6/FVC RATIO: 100 %
Post FEV6/FVC ratio: 100 %
Pre FEV1/FVC ratio: 89 %

## 2017-10-08 MED ORDER — METHACHOLINE 16 MG/ML NEB SOLN
2.0000 mL | Freq: Once | RESPIRATORY_TRACT | Status: AC
  Administered 2017-10-08: 32 mg via RESPIRATORY_TRACT

## 2017-10-08 MED ORDER — ALBUTEROL SULFATE (2.5 MG/3ML) 0.083% IN NEBU
2.5000 mg | INHALATION_SOLUTION | Freq: Once | RESPIRATORY_TRACT | Status: AC
Start: 2017-10-08 — End: 2017-10-08
  Administered 2017-10-08: 2.5 mg via RESPIRATORY_TRACT

## 2017-10-08 MED ORDER — SODIUM CHLORIDE 0.9 % IN NEBU
3.0000 mL | INHALATION_SOLUTION | Freq: Once | RESPIRATORY_TRACT | Status: AC
  Administered 2017-10-08: 3 mL via RESPIRATORY_TRACT

## 2017-10-08 MED ORDER — METHACHOLINE 0.25 MG/ML NEB SOLN
2.0000 mL | Freq: Once | RESPIRATORY_TRACT | Status: AC
  Administered 2017-10-08: 0.5 mg via RESPIRATORY_TRACT

## 2017-10-08 MED ORDER — METHACHOLINE 4 MG/ML NEB SOLN
2.0000 mL | Freq: Once | RESPIRATORY_TRACT | Status: AC
  Administered 2017-10-08: 8 mg via RESPIRATORY_TRACT

## 2017-10-08 MED ORDER — METHACHOLINE 1 MG/ML NEB SOLN
2.0000 mL | Freq: Once | RESPIRATORY_TRACT | Status: AC
  Administered 2017-10-08: 2 mg via RESPIRATORY_TRACT

## 2017-10-08 MED ORDER — METHACHOLINE 0.0625 MG/ML NEB SOLN
2.0000 mL | Freq: Once | RESPIRATORY_TRACT | Status: AC
  Administered 2017-10-08: 0.125 mg via RESPIRATORY_TRACT

## 2017-10-16 ENCOUNTER — Telehealth: Payer: Self-pay | Admitting: Emergency Medicine

## 2017-10-16 NOTE — Telephone Encounter (Signed)
Patient is asking for the results of her methacholine challenge from last week.   RB, please advise.

## 2017-10-22 NOTE — Telephone Encounter (Signed)
Please let her know that the methacholine test does NOT show any evidence for asthma. We do not need to start asthma medication. This is good news.

## 2017-10-22 NOTE — Telephone Encounter (Signed)
Attempted to call the pt. I did not receive an answer. I could not leave a message due to her voicemail being full. Will try back.

## 2017-10-22 NOTE — Telephone Encounter (Signed)
RB please advise on results. Thanks. 

## 2017-10-23 NOTE — Telephone Encounter (Signed)
I attempted to call pt but there was no answer. Unable to leave VM. Will try again later.

## 2017-10-24 ENCOUNTER — Ambulatory Visit: Payer: BLUE CROSS/BLUE SHIELD | Admitting: Emergency Medicine

## 2017-10-24 NOTE — Telephone Encounter (Signed)
Spoke with patient. She is aware of results. Verbalized understanding. Nothing else needed at time of call.  

## 2017-10-29 ENCOUNTER — Ambulatory Visit: Payer: BLUE CROSS/BLUE SHIELD | Admitting: Acute Care

## 2017-11-05 ENCOUNTER — Ambulatory Visit: Payer: BLUE CROSS/BLUE SHIELD | Admitting: Acute Care

## 2017-11-05 ENCOUNTER — Telehealth: Payer: Self-pay | Admitting: Cardiology

## 2017-11-05 NOTE — Telephone Encounter (Signed)
New Message:      Pt is relocating to [place] but would like to discuss with someone what she should do until she can find a doctor there

## 2017-11-05 NOTE — Progress Notes (Deleted)
History of Present Illness Cynthia Kirby is a 47 y.o. female never smoker with coronary artery disease, allergic rhinitis, diabetes, GERD, rhinitis, obstructive sleep apnea. Previously followed by Dr. Jamison Kirby for intermittent dyspnea.    HPI 47 year old obese never smoker with a history of coronary artery disease, allergic rhinitis, diabetes, GERD, rhinitis, obstructive sleep apnea.  She has been followed by Dr. Jamison Kirby for intermittent dyspnea.  She has spirometry that I have reviewed from 03/2017 that show normal airflows with restriction.  She was seen for an acute visit here 07/25/17 and was started empirically on Asmanex when she was complaining of upper airway symptoms and dyspnea.  The asmanex made her cough so she stopped. Also started Zyrtec, continue Singulair, increased Zantac at that time. She still has breakthrough reflux. She still has rhinitis on singulair, flonase. She has intermittent dyspnea. Was given albuterol inhaler by her PCP > uses it about 3-4x week, unclear whether it helps.     11/05/2017  Pt. Presents for follow up. She was seen by Dr. Delton Kirby 08/2017. Plan after that visit was as follows:   Plan  Asthma Unclear diagnosis.  She stopped Asmanex due to cough.  I believe she needs a methacholine challenge to sort this out.  OSA (obstructive sleep apnea) Mild obstructive sleep apnea noted on home sleep study.  It was in the supine position for the most part.  She has been asked to try sleeping on her side.  Chronic allergic rhinitis Still some breakthrough.  Continue Singulair, Flonase, Zyrtec.  GERD (gastroesophageal reflux disease) Continues to have reflux symptoms and I suspect that this is the largest contributor to her upper airway irritation and cough.  Continue Zantac twice a day, add Nexium  Dyspnea Suspect largely due to restrictive lung disease. Methacholine should help [place] sort out.     Test Results: 09/2017 Methacholine Challenge Pulmonary Function  Diagnosis: METHACHOLINE INHALATION CHALLENGE Normal Spirometry Restriction -Possible Negative for hyepr-reactive airways. FEV1 did not decline by 20% from baseline. This  CBC Latest Ref Rng & Units 12/01/2016 11/29/2016 11/14/2016  WBC 3.4 - 10.8 x10E3/uL 8.5 CANCELED 9.6  Hemoglobin 11.1 - 15.9 g/dL 11.0(L) - 11.0(L)  Hematocrit 34.0 - 46.6 % 32.8(L) - 32.0(L)  Platelets 150 - 379 x10E3/uL 462(H) - 414(H)    BMP Latest Ref Rng & Units 12/06/2016 11/29/2016 11/14/2016  Glucose 65 - 99 mg/dL 161(W) 960(A) 540(J)  BUN 6 - 24 mg/dL Creatinine 0.57 - 1.00 mg/dL 8.11 9.14(N) 8.29  BUN/Creat Ratio 9 - -  Sodium 134 - 144 mmol/L 139 142 137  Potassium 3.5 - 5.2 mmol/L 4.4 4.1 3.4(L)  Chloride 96 - 106 mmol/L 99 102 105  CO2 20 - 29 mmol/L 24 23 21(L)  Calcium 8.7 - 10.2 mg/dL 56.2 9.4 1.3(Y)    BNP    Component Value Date/Time   BNP 67.6 11/13/2016 0627    ProBNP    Component Value Date/Time   PROBNP 134 (H) 11/29/2016 1526    PFT    Component Value Date/Time   FEV1PRE 2.49 10/08/2017 1113   FEV1POST 2.70 10/08/2017 1113   FVCPRE 2.78 10/08/2017 1113   FVCPOST 2.99 10/08/2017 1113   TLC 4.29 04/03/2017 1315   DLCOUNC 20.66 04/03/2017 1315   PREFEV1FVCRT 89 10/08/2017 1113   PSTFEV1FVCRT 90 10/08/2017 1113    No results found.   Past medical hx Past Medical History:  Diagnosis Date  . Allergic rhinitis   . Anemia   .  Anxiety   . Arthritis    "left ankle; knees" (11/13/2016)  . CAD in native artery 11/12/2016   A. NSTEMI with LHC 11/13/16 RCA 50%, Circ 40%, 2nd OM 40%, 2nd diag 70%, Mid LAD 95%- DES placed. EF normal. b. Repeat nuc 11/2016 was normal.  . Depression    "hx; not now" (11/13/2016)  . GERD (gastroesophageal reflux disease)   . Headache    "weekly" (11/13/2016)  . Hyperlipidemia   . Hypertension   . Hypothyroidism   . Migraine    "weekly" (11/13/2016)  . Sleep apnea    "CPAP RX'd; never used" (11/13/2016)  . Type II diabetes mellitus  (HCC)      Social History   Tobacco Use  . Smoking status: Passive Smoke Exposure - Never Smoker  . Smokeless tobacco: Never Used  . Tobacco comment: remote smoking socially in her 65s - "maybe 5 todal  Substance Use Topics  . Alcohol use: Yes    Comment: 11/13/2016 "might have a couple drinks/year"  . Drug use: No    Ms.Cynthia Kirby reports that she is a non-smoker but has been exposed to tobacco smoke. She has never used smokeless tobacco. She reports that she drinks alcohol. She reports that she does not use drugs.  Tobacco Cessation: Counseling given: Not Answered Comment: remote smoking socially in her 30s - "maybe 5 todal   Past surgical hx, Family hx, Social hx all reviewed.  Current Outpatient Medications on File Prior to Visit  Medication Sig  . amLODipine (NORVASC) 10 MG tablet Take 1 tablet (10 mg total) by mouth daily.  Marland Kitchen aspirin 81 MG chewable tablet Chew 1 tablet (81 mg total) by mouth daily.  . cyanocobalamin 500 MCG tablet Take 500 mcg by mouth daily.  . Echinacea 125 MG CAPS Take 1 capsule by mouth daily.   Marland Kitchen esomeprazole (NEXIUM) 40 MG capsule Take 1 capsule (40 mg total) by mouth daily.  . fluticasone (FLONASE) 50 MCG/ACT nasal spray Place 2 sprays into both nostrils daily.  . insulin glargine (LANTUS) 100 UNIT/ML injection Inject 14 Units into the skin at bedtime.  Marland Kitchen losartan (COZAAR) 100 MG tablet Take 1 tablet (100 mg total) by mouth daily.  . metFORMIN (GLUCOPHAGE) 1000 MG tablet Take 1,000 mg by mouth 2 (two) times daily with a meal.   . metoprolol succinate (TOPROL-XL) 50 MG 24 hr tablet Take 50 mg by mouth daily. Take with or immediately following a meal.  . montelukast (SINGULAIR) 10 MG tablet Take 1 tablet (10 mg total) by mouth at bedtime.  . nitroGLYCERIN (NITROSTAT) 0.4 MG SL tablet Place 1 tablet (0.4 mg total) under the tongue every 5 (five) minutes x 3 doses as needed for chest pain.  Marland Kitchen ondansetron (ZOFRAN) 8 MG tablet Take 8 mg by mouth every 8 (eight)  hours as needed for nausea or vomiting.   . pantoprazole (PROTONIX) 40 MG tablet Take 1 tablet by mouth 2 (two) times daily as needed (acid reflux).   . ranitidine (ZANTAC) 150 MG tablet Take 1 tablet (150 mg total) by mouth 2 (two) times daily.  . rosuvastatin (CRESTOR) 10 MG tablet Take 1 tablet (10 mg total) by mouth daily.  Marland Kitchen spironolactone (ALDACTONE) 25 MG tablet Take 1 tablet (25 mg total) by mouth daily.  Marland Kitchen zinc sulfate 220 (50 Zn) MG capsule Take 220 mg by mouth daily.   No current facility-administered medications on file prior to visit.      Allergies  Allergen Reactions  . Penicillins  Anaphylaxis and Itching  . Dapagliflozin     Other reaction(s): Other Genital mycotic infections  . Orange Oil Swelling    Tongue swelling.   . Lisinopril     Other reaction(s): COUGH  . Penicillin G Swelling    Review Of Systems:  Constitutional:   No  weight loss, night sweats,  Fevers, chills, fatigue, or  lassitude.  HEENT:   No headaches,  Difficulty swallowing,  Tooth/dental problems, or  Sore throat,                No sneezing, itching, ear ache, nasal congestion, post nasal drip,   CV:  No chest pain,  Orthopnea, PND, swelling in lower extremities, anasarca, dizziness, palpitations, syncope.   GI  No heartburn, indigestion, abdominal pain, nausea, vomiting, diarrhea, change in bowel habits, loss of appetite, bloody stools.   Resp: No shortness of breath with exertion or at rest.  No excess mucus, no productive cough,  No non-productive cough,  No coughing up of blood.  No change in color of mucus.  No wheezing.  No chest wall deformity  Skin: no rash or lesions.  GU: no dysuria, change in color of urine, no urgency or frequency.  No flank pain, no hematuria   MS:  No joint pain or swelling.  No decreased range of motion.  No back pain.  Psych:  No change in mood or affect. No depression or anxiety.  No memory loss.   Vital Signs There were no vitals taken for this  visit.   Physical Exam:  General- No distress,  A&Ox3 ENT: No sinus tenderness, TM clear, pale nasal mucosa, no oral exudate,no post nasal drip, no LAN Cardiac: S1, S2, regular rate and rhythm, no murmur Chest: No wheeze/ rales/ dullness; no accessory muscle use, no nasal flaring, no sternal retractions Abd.: Soft Non-tender Ext: No clubbing cyanosis, edema Neuro:  normal strength Skin: No rashes, warm and dry Psych: normal mood and behavior   Assessment/Plan  No problem-specific Assessment & Plan notes found for this encounter.    Bevelyn Ngo, NP 11/05/2017  8:57 AM

## 2017-11-05 NOTE — Telephone Encounter (Signed)
Left message to call back and to schedule 6 month f/u appt

## 2017-11-06 NOTE — Telephone Encounter (Signed)
I spoke with patient. She states she is relocating to [place] on 12/02/17. Informed patient to keep her 6 mo OV appt on 11/30/17 with Lizabeth Leyden, NP and she can establish care with a primary MD in [place] who can refer her to cardiologist in the area. She is in  Agreement with plan and thankful for the call

## 2017-11-09 ENCOUNTER — Encounter: Payer: Self-pay | Admitting: Cardiology

## 2017-11-28 ENCOUNTER — Encounter: Payer: Self-pay | Admitting: Cardiology

## 2017-11-28 ENCOUNTER — Ambulatory Visit (INDEPENDENT_AMBULATORY_CARE_PROVIDER_SITE_OTHER): Payer: BLUE CROSS/BLUE SHIELD | Admitting: Cardiology

## 2017-11-28 VITALS — BP 144/88 | HR 86 | Ht 65.0 in | Wt 227.6 lb

## 2017-11-28 DIAGNOSIS — I2510 Atherosclerotic heart disease of native coronary artery without angina pectoris: Secondary | ICD-10-CM

## 2017-11-28 DIAGNOSIS — E785 Hyperlipidemia, unspecified: Secondary | ICD-10-CM | POA: Diagnosis not present

## 2017-11-28 DIAGNOSIS — I10 Essential (primary) hypertension: Secondary | ICD-10-CM | POA: Diagnosis not present

## 2017-11-28 MED ORDER — ISOSORBIDE MONONITRATE ER 30 MG PO TB24: 15.0000 mg | ORAL_TABLET | Freq: Every day | ORAL | 3 refills | Status: AC

## 2017-11-28 NOTE — Patient Instructions (Signed)
Medication Instructions:  Your physician has recommended you make the following change in your medication:  START: Imdur 15 mg (0.5 tablet) one time a day    If you need a refill on your cardiac medications, please contact your pharmacy first.  Labwork: None ordered   Testing/Procedures: None ordered   Follow-Up: Your physician wants you to follow-up AS NEEDED with Dr. Mayford Knifeurner  Any Other Special Instructions Will Be Listed Below (If Applicable).   Thank you [facility] choosing [facility]    Lyda PeroneRena Ifeoma Vallin, RN  [phone]  If you need a refill on your cardiac medications before your next appointment, please call your pharmacy.

## 2017-11-28 NOTE — Progress Notes (Signed)
Cardiology Office Note:    Date:  12/03/2017   ID:  Cynthia Kirby, DOB 11-20-1970, MRN [id number]  PCP:  Heron NayYoung, Lauren E, PA  Cardiologist:  Armanda Magicraci Semaje Kinker, MD    Referring MD: Heron NayYoung, Lauren E, PA   Chief Complaint  Patient presents with  . Coronary Artery Disease  . Hypertension  . Hyperlipidemia  . Shortness of Breath    History of Present Illness:    Cynthia Kirby is a 47 y.o. female with a hx of DM, HTN, severe anxiety, social phobia, panic attacks, hyperlipidemia, and CAD [facility]   LHC 11/13/16 showed 95% mLAD s/p DES, otherwise residual 50% mRCa, 40% mCx, 40% OM2, 70% D2, EF 55-65%. She was started on Apirin and Brilinta. A1C was 10.5 and LDL 120 prompting recommendation for aggressive medical therapy.  She had problems with shortness of breath on Brilinta and was switched to Effient in June 2018 but dyspnea on exertion continued and further work-up showed a mildly elevated BNP at 134 normal creatinine 1.03 and negative d-dimer.  Aldactone was added.  Repeat nuclear stress test showed no inducible ischemia.  In the past she has drank alcohol as well as occasional marijuana use.  She has a history of noncompliance with her blood pressure medications.  Is been felt that her shortness of breath has been related to anxiety.  She stopped her Effient.  She is here today for followup.  She is still having episodic chest pain and SOB.  PND, orthopnea, LE edema, dizziness  or syncope.  She has been having some problems with palpitations as well.  She is compliant with her meds and is tolerating meds with no SE.     Past Medical History:  Diagnosis Date  . Allergic rhinitis   . Anemia   . Anxiety   . Arthritis    "left ankle; knees" (11/13/2016)  . CAD in native artery 11/12/2016   A. NSTEMI with LHC 11/13/16 RCA 50%, Circ 40%, 2nd OM 40%, 2nd diag 70%, Mid LAD 95%- DES placed. EF normal. b. Repeat nuc 11/2016 was normal.  . Depression    "hx; not now" (11/13/2016)  .  GERD (gastroesophageal reflux disease)   . Headache    "weekly" (11/13/2016)  . Hyperlipidemia   . Hypertension   . Hypothyroidism   . Migraine    "weekly" (11/13/2016)  . Sleep apnea    "CPAP RX'd; never used" (11/13/2016)  . Type II diabetes mellitus (HCC)     Past Surgical History:  Procedure Laterality Date  . CORONARY ANGIOPLASTY WITH STENT PLACEMENT  11/13/2016  . CORONARY STENT INTERVENTION N/A 11/13/2016   Procedure: Coronary Stent Intervention;  Surgeon: Corky CraftsVaranasi, Jayadeep S, MD;  [facility];  Service: Cardiovascular;  Laterality: N/A;  . INDUCED ABORTION    . LEFT HEART CATH AND CORONARY ANGIOGRAPHY N/A 11/13/2016   Procedure: Left Heart Cath and Coronary Angiography;  Surgeon: Corky CraftsVaranasi, Jayadeep S, MD;  [facility];  Service: Cardiovascular;  Laterality: N/A;  . TONSILLECTOMY AND ADENOIDECTOMY  2011    Current Medications: Current Meds  Medication Sig  . amLODipine (NORVASC) 10 MG tablet Take 1 tablet (10 mg total) by mouth daily.  Marland Kitchen. aspirin 81 MG chewable tablet Chew 1 tablet (81 mg total) by mouth daily.  . cyanocobalamin 500 MCG tablet Take 500 mcg by mouth daily.  . Echinacea 125 MG CAPS Take 1 capsule by mouth daily.   Marland Kitchen. esomeprazole (NEXIUM) 40  MG capsule Take 1 capsule (40 mg total) by mouth daily.  . fluticasone (FLONASE) 50 MCG/ACT nasal spray Place 2 sprays into both nostrils daily.  . insulin glargine (LANTUS) 100 UNIT/ML injection Inject 14 Units into the skin at bedtime.  . metFORMIN (GLUMETZA) 1000 MG (MOD) 24 hr tablet Take 1,000 mg by mouth daily with breakfast.  . metoprolol succinate (TOPROL-XL) 50 MG 24 hr tablet Take 50 mg by mouth daily. Take with or immediately following a meal.  . montelukast (SINGULAIR) 10 MG tablet Take 1 tablet (10 mg total) by mouth at bedtime.  . nitroGLYCERIN (NITROSTAT) 0.4 MG SL tablet Place 1 tablet (0.4 mg total) under the tongue every 5 (five) minutes x 3 doses as needed for chest pain.    Marland Kitchen ondansetron (ZOFRAN) 8 MG tablet Take 8 mg by mouth every 8 (eight) hours as needed for nausea or vomiting.   . pantoprazole (PROTONIX) 40 MG tablet Take 1 tablet by mouth 2 (two) times daily as needed (acid reflux).   . ranitidine (ZANTAC) 150 MG tablet Take 1 tablet (150 mg total) by mouth 2 (two) times daily.  . rosuvastatin (CRESTOR) 10 MG tablet Take 1 tablet (10 mg total) by mouth daily.  Marland Kitchen spironolactone (ALDACTONE) 25 MG tablet Take 1 tablet (25 mg total) by mouth daily.  Marland Kitchen zinc sulfate 220 (50 Zn) MG capsule Take 220 mg by mouth daily.     Allergies:   Diclofenac; Penicillins; Ciprofloxacin; Dapagliflozin; Orange oil; Lisinopril; Penicillin g; and Azithromycin   Social History   Socioeconomic History  . Marital status: Single    Spouse name: Not on file  . Number of children: Not on file  . Years of education: Not on file  . Highest education level: Not on file  Occupational History  . Occupation: Network engineer of business, Print production planner  Social Needs  . Financial resource strain: Not hard at all  . Food insecurity:    Worry: Never true    Inability: Never true  . Transportation needs:    Medical: No    Non-medical: No  Tobacco Use  . Smoking status: Passive Smoke Exposure - Never Smoker  . Smokeless tobacco: Never Used  . Tobacco comment: remote smoking socially in her 26s - "maybe 5 todal  Substance and Sexual Activity  . Alcohol use: Yes    Comment: 11/13/2016 "might have a couple drinks/year"  . Drug use: No  . Sexual activity: Yes    Birth control/protection: None  Lifestyle  . Physical activity:    Days per week: Not on file    Minutes per session: Not on file  . Stress: Very much  Relationships  . Social connections:    Talks on phone: Not on file    Gets together: Not on file    Attends religious service: Not on file    Active member of club or organization: Not on file    Attends meetings of clubs or organizations: Not on file    Relationship status:  Not on file  Other Topics Concern  . Not on file  Social History Narrative   [facility] (01/02/17):   Originally from [place]. She moved to [facility] in 2013. 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 She reports remote exposure to a cockatiel & parakeets. She has questionable recent exposure to mold. No hot tub exposure. Enjoys writing music.  Family History: The patient's family history includes Diabetes in her father and mother; Healthy in her brother and sister; Hypertension in her father and mother; Prostate cancer in her father. There is no history of Lung disease.  ROS:   Please see the history of present illness.    ROS  All other systems reviewed and negative.   EKGs/Labs/Other Studies Reviewed:    The following studies were reviewed today: none  EKG:  EKG is not ordered today.    Recent Labs: 12/06/2016: BUN 9; Creatinine, Ser 0.90; Potassium 4.4; Sodium 139 06/25/2017: ALT 12   Recent Lipid Panel    Component Value Date/Time   CHOL 163 06/25/2017 0937   TRIG 300 (H) 06/25/2017 0937   HDL 35 (L) 06/25/2017 0937   CHOLHDL 4.7 (H) 06/25/2017 0937   CHOLHDL 5.3 11/13/2016 0627   VLDL 49 (H) 11/13/2016 0627   LDLCALC 68 06/25/2017 0937    Physical Exam:    VS:  BP (!) 144/88   Pulse 86   Ht 5\' 5"  (1.651 m)   Wt 227 lb 9.6 oz (103.2 kg)   SpO2 97%   BMI 37.87 kg/m     Wt Readings from Last 3 Encounters:  11/28/17 227 lb 9.6 oz (103.2 kg)  09/11/17 228 lb (103.4 kg)  08/29/17 229 lb 15 oz (104.3 kg)     GEN:  Well nourished, well developed in no acute distress HEENT: Normal NECK: No JVD; No carotid bruits LYMPHATICS: No lymphadenopathy CARDIAC: RRR, no murmurs, rubs, gallops RESPIRATORY:  Clear to auscultation without rales, wheezing or rhonchi  ABDOMEN: Soft, non-tender, non-distended MUSCULOSKELETAL:  No edema; No deformity  SKIN: Warm and dry NEUROLOGIC:   Alert and oriented x 3 PSYCHIATRIC:  Normal affect   ASSESSMENT:    1. Coronary artery disease involving native coronary artery of native heart without angina pectoris   2. Benign essential HTN   3. Hyperlipidemia LDL goal <70    PLAN:    In order of problems listed above:  1.  ASCAD - NSTEMI s/p DES to mLAD 10/2016.   LHC 11/13/16 showed 95% mLAD s/p DES, otherwise residual 50% mRCa, 40% mCx, 40% OM2, 70% D2, EF 55-65%.  She was intolerant to Brilinta due to shortness of breath and was switched to Effient which she subsequently has stopped..  She continues to have episodes of chest pain that are somewhat atypical and seem to be related to episodes of anxiety and panic attacks.  Going to start her on Imdur 15 mg daily to see if that helps of borderline obstructive disease in other vessels at time of cath.  She continues to have episodes of chest pain may need to consider repeat cath.  Continue aspirin 81 mg daily, beta-blocker, statin.  Add long-acting nitrate 15 mg daily.  She is moving to [place] next week.  I recommended she get worked in with a cardiologist in [place] ASAP to follow along.  Recommendation is if she has recurrent chest pain on maximum antianginal therapy the need to consider repeat cardiac catheterization.  She is over a year out from PCI and stopped her Effient on her own.  Will not restart at this time since it is been over a year since her PCI.  2.  Hypertension - BP is well controlled on exam today.  Will continue on spironolactone 25 mg daily, amlodipine 10 mg daily and Toprol-XL 50 mg daily.   3.  Hyperlipidemia - LDL goal is less than 70.  She will continue on Crestor 10 mg daily.  LDL was at goal at 68 on 06/25/2017.  Medication Adjustments/Labs and Tests Ordered: Current medicines are reviewed at length with the patient today.  Concerns regarding medicines are outlined above.  No orders of the defined types were placed in this encounter.  Meds ordered this  encounter  Medications  . isosorbide mononitrate (IMDUR) 30 MG 24 hr tablet    Sig: Take 0.5 tablets (15 mg total) by mouth daily.    Dispense:  45 tablet    Refill:  3    Signed, Armanda Magic, MD  12/03/2017 2:14 PM    [facility]

## 2017-11-30 ENCOUNTER — Ambulatory Visit: Payer: BLUE CROSS/BLUE SHIELD | Admitting: Cardiology

## 2017-11-30 NOTE — Progress Notes (Deleted)
Cardiology Office Note:    Date:  11/30/2017   ID:  Cynthia Kirby, DOB May 01, 1971, MRN [id number]  PCP:  Heron Nay, PA  Cardiologist:  Armanda Magic, MD  Referring MD: Heron Nay, PA   No chief complaint on file. ***  History of Present Illness:    Cynthia Kirby is a 47 y.o. female with a past medical history significant for diabetes on insulin, hypertension, severe anxiety, social phobia, panic attacks, hyperlipidemia and CAD with non-STEMI status post DES to [facility] 10/2016.   LHC 11/13/16 showed 95% mLAD s/p DES, otherwise residual 50% mRCa, 40% mCx, 40% OM2, 70% D2, EF 55-65%. She was started on Apirin and Brilinta. A1C was 10.5 and LDL 120 prompting recommendation for aggressive medical therapy.  Brilinta was switched to Effient in 11/2016 due to shortness of breath, however dyspnea on exertion continued and further work-up showed a mildly elevated BNP at 134, normal creatinine and negative d-dimer.  Aldactone was added.  Repeat nuclear stress test on 12/13/2016 showed no inducible ischemia.  In the past the patient was known to drink alcohol as well as occasional marijuana use.  She has a history of noncompliance with her blood pressure medications.  It has been felt that her shortness of breath has been related to anxiety.   She is here today for follow-up    -----------------------  CAD: Status post DES to mid LAD and 10/2016.  On aspirin, Imdur 15 mg, Toprol-XL 50 mg, statin  Hypertention   loaded Pinn 10 mg, Toprol-XL 50 mg, Spironolactone 25 mg daily  Hyperlipidemia: LDL goal less than 70.  On Crestor 10 mg daily.  LDL was 68 in 05/2017, at goal.  Continue current medical therapy  Diabetes on insulin:    ?OSA   Past Medical History:  Diagnosis Date  . Allergic rhinitis   . Anemia   . Anxiety   . Arthritis    "left ankle; knees" (11/13/2016)  . CAD in native artery 11/12/2016   A. NSTEMI with LHC 11/13/16 RCA 50%, Circ 40%, 2nd OM 40%, 2nd diag 70%, Mid LAD 95%- DES  placed. EF normal. b. Repeat nuc 11/2016 was normal.  . Depression    "hx; not now" (11/13/2016)  . GERD (gastroesophageal reflux disease)   . Headache    "weekly" (11/13/2016)  . Hyperlipidemia   . Hypertension   . Hypothyroidism   . Migraine    "weekly" (11/13/2016)  . Sleep apnea    "CPAP RX'd; never used" (11/13/2016)  . Type II diabetes mellitus (HCC)     Past Surgical History:  Procedure Laterality Date  . CORONARY ANGIOPLASTY WITH STENT PLACEMENT  11/13/2016  . CORONARY STENT INTERVENTION N/A 11/13/2016   Procedure: Coronary Stent Intervention;  Surgeon: Corky Crafts, MD;  Location: [facility]AS[facility] CV LAB;  Service: Cardiovascular;  Laterality: N/A;  . INDUCED ABORTION    . LEFT HEART CATH AND CORONARY ANGIOGRAPHY N/A 11/13/2016   Procedure: Left Heart Cath and Coronary Angiography;  Surgeon: Corky Crafts, MD;  Location: [facility] CV LAB;  Service: Cardiovascular;  Laterality: N/A;  . TONSILLECTOMY AND ADENOIDECTOMY  2011    Current Medications: No outpatient medications have been marked as taking for the 11/30/17 encounter (Appointment) with Berton Bon, NP.     Allergies:   Diclofenac; Penicillins; Ciprofloxacin; Dapagliflozin; Orange oil; Lisinopril; Penicillin g; and Azithromycin   Social History   Socioeconomic History  . Marital status: Single    Spouse name: Not on file  .  Number of children: Not on file  . Years of education: Not on file  . Highest education level: Not on file  Occupational History  . Occupation: Network engineer of business, Print production planner  Social Needs  . Financial resource strain: Not hard at all  . Food insecurity:    Worry: Never true    Inability: Never true  . Transportation needs:    Medical: No    Non-medical: No  Tobacco Use  . Smoking status: Passive Smoke Exposure - Never Smoker  . Smokeless tobacco: Never Used  . Tobacco comment: remote smoking socially in her 57s - "maybe 5 todal  Substance and Sexual Activity  .  Alcohol use: Yes    Comment: 11/13/2016 "might have a couple drinks/year"  . Drug use: No  . Sexual activity: Yes    Birth control/protection: None  Lifestyle  . Physical activity:    Days per week: Not on file    Minutes per session: Not on file  . Stress: Very much  Relationships  . Social connections:    Talks on phone: Not on file    Gets together: Not on file    Attends religious service: Not on file    Active member of club or organization: Not on file    Attends meetings of clubs or organizations: Not on file    Relationship status: Not on file  Other Topics Concern  . Not on file  Social History Narrative   [facility] (01/02/17):   Originally from [place]. She moved to [facility] in 2013. 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 She reports remote exposure to a cockatiel & parakeets. She has questionable recent exposure to mold. No hot tub exposure. Enjoys writing music.      Family History: The patient's ***family history includes Diabetes in her father and mother; Healthy in her brother and sister; Hypertension in her father and mother; Prostate cancer in her father. There is no history of Lung disease. ROS:   Please see the history of present illness.    *** All other systems reviewed and are negative.  EKGs/Labs/Other Studies Reviewed:    The following studies were reviewed today:  Nuclear stress test 12/13/2016 Study Highlights    Nuclear stress EF: 63%.  The study is normal. no evidence of ischemia or infarction  This is a low risk study.  The left ventricular ejection fraction is normal (55-65%).    Echocardiogram 11/27/2016 Study Conclusions - Left ventricle: The cavity size was normal. Wall thickness was   increased in a pattern of moderate LVH. Systolic function was   normal. The estimated ejection fraction was in the range of 60%   to 65%. Doppler parameters are  consistent with abnormal left   ventricular relaxation (grade 1 diastolic dysfunction).  Coronary Stent Intervention  Left Heart Cath and Coronary Angiography  11/13/16  Conclusion   Mid RCA lesion, 50 %stenosed. Diffuse stenosis.  Mid Cx lesion, 40 %stenosed. Diffuse stenosis.  Ost 2nd Mrg to 2nd Mrg lesion, 40 %stenosed.  Ost 2nd Diag to 2nd Diag lesion, 70 %stenosed.  The left ventricular systolic function is normal.  LV end diastolic pressure is normal.  The left ventricular ejection fraction is 55-65% by visual estimate.  There is no aortic valve stenosis.  Mid LAD lesion, 95 %stenosed. A STENT SYNERGY DES 3X32 drug eluting stent was successfully placed, postdilated to  3.6 mm.  Post intervention, there is a 0% residual stenosis.   Continue tirofiban for 2 hours post procedure.    I stressed the importance of dual antiplatelet therapy.  If she has trouble getting Brilinta after the one month free period, she can be switched to Plavix.  Would give 300 mg x 1 followed by 75 mg daily.  She needs aggressive secondary prevention as well.     EKG:  EKG is *** ordered today.  The ekg ordered today demonstrates ***  Recent Labs: 12/01/2016: Hemoglobin 11.0; Platelets 462 12/06/2016: BUN 9; Creatinine, Ser 0.90; Potassium 4.4; Sodium 139 06/25/2017: ALT 12   Recent Lipid Panel    Component Value Date/Time   CHOL 163 06/25/2017 0937   TRIG 300 (H) 06/25/2017 0937   HDL 35 (L) 06/25/2017 0937   CHOLHDL 4.7 (H) 06/25/2017 0937   CHOLHDL 5.3 11/13/2016 0627   VLDL 49 (H) 11/13/2016 0627   LDLCALC 68 06/25/2017 0937    Physical Exam:    VS:  There were no vitals taken for this visit.    Wt Readings from Last 3 Encounters:  11/28/17 227 lb 9.6 oz (103.2 kg)  09/11/17 228 lb (103.4 kg)  08/29/17 229 lb 15 oz (104.3 kg)     Physical Exam***   ASSESSMENT:    No diagnosis found. PLAN:    In order of problems listed above:  1. ***   Medication  Adjustments/Labs and Tests Ordered: Current medicines are reviewed at length with the patient today.  Concerns regarding medicines are outlined above. Labs and tests ordered and medication changes are outlined in the patient instructions below:  There are no Patient Instructions on file for this visit.   Signed, Berton BonJanine Henli Hey, NP  11/30/2017 6:03 AM    [facility]

## 2018-01-13 IMAGING — NM NM MISC PROCEDURE
3 series · 18 of 18 positions shown · non-contrast
Comparison: none

[Series 1: stress-gsp_(id)_sa · 6.4mm · 6.40mm/px · 6 of 512 frames shown]
[frame 43/512]
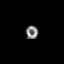
[frame 128/512]
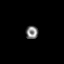
[frame 214/512]
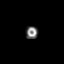
[frame 299/512]
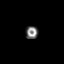
[frame 384/512]
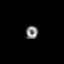
[frame 470/512]
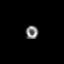

[Series 1: stress-sum-em_(id)_sa · 6.4mm · 6.40mm/px · 6 of 64 frames shown]
[frame 6/64]
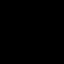
[frame 16/64]
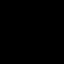
[frame 27/64]
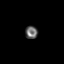
[frame 38/64]
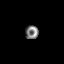
[frame 48/64]
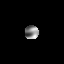
[frame 59/64]
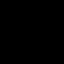

[Series 1: rest_(id)_sa · 6.4mm · 6.40mm/px · 6 of 64 frames shown]
[frame 6/64]
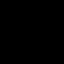
[frame 16/64]
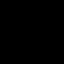
[frame 27/64]
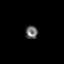
[frame 38/64]
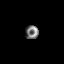
[frame 48/64]
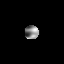
[frame 59/64]
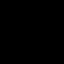

[18 of 18 positions shown; findings below may reference images not displayed]

Canned report from images found in remote index.

Refer to host system for actual result text.

## 2018-01-29 ENCOUNTER — Other Ambulatory Visit: Payer: Self-pay | Admitting: Cardiology

## 2018-01-29 ENCOUNTER — Other Ambulatory Visit: Payer: Self-pay | Admitting: Emergency Medicine

## 2018-01-29 ENCOUNTER — Other Ambulatory Visit: Payer: Self-pay | Admitting: Physician Assistant

## 2018-01-29 MED ORDER — METOPROLOL SUCCINATE ER 50 MG PO TB24: 50.0000 mg | ORAL_TABLET | Freq: Every day | ORAL | 3 refills | Status: AC

## 2018-06-20 ENCOUNTER — Other Ambulatory Visit: Payer: Self-pay | Admitting: Emergency Medicine

## 2018-09-25 ENCOUNTER — Other Ambulatory Visit: Payer: Self-pay | Admitting: Physician Assistant

## 2018-09-25 ENCOUNTER — Other Ambulatory Visit: Payer: Self-pay | Admitting: *Deleted

## 2018-09-25 ENCOUNTER — Other Ambulatory Visit: Payer: Self-pay | Admitting: Cardiology

## 2018-09-25 NOTE — Telephone Encounter (Signed)
[facility] is requesting a refill on Metformin. Would Dr. Mayford Knife like to refill this medication? Please address
